# Patient Record
Sex: Female | Born: 1970
Health system: Southern US, Community
[De-identification: ages and names within clinical notes are randomized; demographics above are authoritative.]

## PROBLEM LIST (undated history)

## (undated) DIAGNOSIS — E039 Hypothyroidism, unspecified: Secondary | ICD-10-CM

## (undated) DIAGNOSIS — I10 Essential (primary) hypertension: Secondary | ICD-10-CM

## (undated) DIAGNOSIS — Z808 Family history of malignant neoplasm of other organs or systems: Secondary | ICD-10-CM

## (undated) DIAGNOSIS — Z803 Family history of malignant neoplasm of breast: Secondary | ICD-10-CM

## (undated) DIAGNOSIS — F419 Anxiety disorder, unspecified: Secondary | ICD-10-CM

## (undated) HISTORY — DX: Hypothyroidism, unspecified: E03.9

## (undated) HISTORY — DX: Family history of malignant neoplasm of breast: Z80.3

## (undated) HISTORY — DX: Family history of malignant neoplasm of other organs or systems: Z80.8

## (undated) HISTORY — PX: THYROIDECTOMY: SHX17

## (undated) HISTORY — DX: Anxiety disorder, unspecified: F41.9

---

## 1997-12-10 ENCOUNTER — Ambulatory Visit (HOSPITAL_COMMUNITY): Admission: AD | Admit: 1997-12-10 | Discharge: 1997-12-10 | Payer: Self-pay | Admitting: Obstetrics and Gynecology

## 1998-05-08 ENCOUNTER — Inpatient Hospital Stay (HOSPITAL_COMMUNITY): Admission: AD | Admit: 1998-05-08 | Discharge: 1998-05-08 | Payer: Self-pay | Admitting: Obstetrics and Gynecology

## 1998-05-13 ENCOUNTER — Inpatient Hospital Stay (HOSPITAL_COMMUNITY): Admission: AD | Admit: 1998-05-13 | Discharge: 1998-05-19 | Payer: Self-pay | Admitting: Obstetrics and Gynecology

## 1998-05-20 ENCOUNTER — Encounter (HOSPITAL_COMMUNITY): Admission: RE | Admit: 1998-05-20 | Discharge: 1998-07-01 | Payer: Self-pay | Admitting: Obstetrics and Gynecology

## 1999-08-22 ENCOUNTER — Other Ambulatory Visit: Admission: RE | Admit: 1999-08-22 | Discharge: 1999-08-22 | Payer: Self-pay | Admitting: Obstetrics and Gynecology

## 2000-08-12 ENCOUNTER — Other Ambulatory Visit: Admission: RE | Admit: 2000-08-12 | Discharge: 2000-08-12 | Payer: Self-pay | Admitting: Obstetrics and Gynecology

## 2000-09-06 ENCOUNTER — Other Ambulatory Visit: Admission: RE | Admit: 2000-09-06 | Discharge: 2000-09-06 | Payer: Self-pay | Admitting: Obstetrics and Gynecology

## 2000-09-06 ENCOUNTER — Encounter (INDEPENDENT_AMBULATORY_CARE_PROVIDER_SITE_OTHER): Payer: Self-pay | Admitting: Specialist

## 2000-10-14 ENCOUNTER — Other Ambulatory Visit: Admission: RE | Admit: 2000-10-14 | Discharge: 2000-10-14 | Payer: Self-pay | Admitting: Obstetrics and Gynecology

## 2000-10-14 ENCOUNTER — Encounter (INDEPENDENT_AMBULATORY_CARE_PROVIDER_SITE_OTHER): Payer: Self-pay

## 2001-05-30 ENCOUNTER — Other Ambulatory Visit: Admission: RE | Admit: 2001-05-30 | Discharge: 2001-05-30 | Payer: Self-pay | Admitting: Obstetrics and Gynecology

## 2001-12-23 ENCOUNTER — Other Ambulatory Visit: Admission: RE | Admit: 2001-12-23 | Discharge: 2001-12-23 | Payer: Self-pay | Admitting: Obstetrics and Gynecology

## 2010-10-17 ENCOUNTER — Emergency Department (HOSPITAL_COMMUNITY)
Admission: EM | Admit: 2010-10-17 | Discharge: 2010-10-17 | Payer: Self-pay | Source: Home / Self Care | Admitting: Emergency Medicine

## 2010-10-20 LAB — CBC
HCT: 35.9 % — ABNORMAL LOW (ref 36.0–46.0)
Hemoglobin: 11.7 g/dL — ABNORMAL LOW (ref 12.0–15.0)
MCH: 29 pg (ref 26.0–34.0)
MCHC: 32.6 g/dL (ref 30.0–36.0)
MCV: 88.9 fL (ref 78.0–100.0)
Platelets: 246 10*3/uL (ref 150–400)
RBC: 4.04 MIL/uL (ref 3.87–5.11)
RDW: 17.6 % — ABNORMAL HIGH (ref 11.5–15.5)
WBC: 9.6 10*3/uL (ref 4.0–10.5)

## 2010-10-20 LAB — DIFFERENTIAL
Basophils Absolute: 0.1 10*3/uL (ref 0.0–0.1)
Basophils Relative: 1 % (ref 0–1)
Eosinophils Absolute: 0.1 10*3/uL (ref 0.0–0.7)
Eosinophils Relative: 1 % (ref 0–5)
Lymphocytes Relative: 29 % (ref 12–46)
Lymphs Abs: 2.8 10*3/uL (ref 0.7–4.0)
Monocytes Absolute: 0.5 10*3/uL (ref 0.1–1.0)
Monocytes Relative: 5 % (ref 3–12)
Neutro Abs: 6.1 10*3/uL (ref 1.7–7.7)
Neutrophils Relative %: 64 % (ref 43–77)

## 2010-10-20 LAB — COMPREHENSIVE METABOLIC PANEL
ALT: 26 U/L (ref 0–35)
AST: 73 U/L — ABNORMAL HIGH (ref 0–37)
Albumin: 4.3 g/dL (ref 3.5–5.2)
Alkaline Phosphatase: 36 U/L — ABNORMAL LOW (ref 39–117)
BUN: 16 mg/dL (ref 6–23)
CO2: 24 mEq/L (ref 19–32)
Calcium: 8 mg/dL — ABNORMAL LOW (ref 8.4–10.5)
Chloride: 102 mEq/L (ref 96–112)
Creatinine, Ser: 0.98 mg/dL (ref 0.4–1.2)
GFR calc Af Amer: >60 mL/min (ref >60)
GFR calc non Af Amer: >60 mL/min (ref >60)
Glucose, Bld: 77 mg/dL (ref 70–99)
Potassium: 3.7 mEq/L (ref 3.5–5.1)
Sodium: 136 mEq/L (ref 135–145)
Total Bilirubin: 0.4 mg/dL (ref 0.3–1.2)
Total Protein: 7.5 g/dL (ref 6.0–8.3)

## 2010-10-20 LAB — LIPASE, BLOOD: Lipase: 44 U/L (ref 11–59)

## 2014-05-16 ENCOUNTER — Encounter: Payer: Self-pay | Admitting: *Deleted

## 2015-05-02 ENCOUNTER — Encounter (HOSPITAL_COMMUNITY): Payer: Self-pay | Admitting: Emergency Medicine

## 2015-05-02 ENCOUNTER — Emergency Department (HOSPITAL_COMMUNITY)
Admission: EM | Admit: 2015-05-02 | Discharge: 2015-05-02 | Disposition: A | Discharge status: Home / Self Care | Payer: Self-pay | Attending: Emergency Medicine | Admitting: Emergency Medicine

## 2015-05-02 DIAGNOSIS — J3489 Other specified disorders of nose and nasal sinuses: Secondary | ICD-10-CM | POA: Insufficient documentation

## 2015-05-02 DIAGNOSIS — Z79899 Other long term (current) drug therapy: Secondary | ICD-10-CM | POA: Insufficient documentation

## 2015-05-02 DIAGNOSIS — E039 Hypothyroidism, unspecified: Secondary | ICD-10-CM | POA: Insufficient documentation

## 2015-05-02 DIAGNOSIS — H53149 Visual discomfort, unspecified: Secondary | ICD-10-CM | POA: Insufficient documentation

## 2015-05-02 DIAGNOSIS — R197 Diarrhea, unspecified: Secondary | ICD-10-CM | POA: Insufficient documentation

## 2015-05-02 DIAGNOSIS — R519 Headache, unspecified: Secondary | ICD-10-CM

## 2015-05-02 DIAGNOSIS — R51 Headache: Secondary | ICD-10-CM | POA: Insufficient documentation

## 2015-05-02 DIAGNOSIS — R110 Nausea: Secondary | ICD-10-CM

## 2015-05-02 DIAGNOSIS — R0981 Nasal congestion: Secondary | ICD-10-CM | POA: Insufficient documentation

## 2015-05-02 DIAGNOSIS — F419 Anxiety disorder, unspecified: Secondary | ICD-10-CM | POA: Insufficient documentation

## 2015-05-02 DIAGNOSIS — Z87891 Personal history of nicotine dependence: Secondary | ICD-10-CM | POA: Insufficient documentation

## 2015-05-02 DIAGNOSIS — R0602 Shortness of breath: Secondary | ICD-10-CM | POA: Insufficient documentation

## 2015-05-02 DIAGNOSIS — I10 Essential (primary) hypertension: Secondary | ICD-10-CM | POA: Insufficient documentation

## 2015-05-02 HISTORY — DX: Essential (primary) hypertension: I10

## 2015-05-02 MED ORDER — KETOROLAC TROMETHAMINE 30 MG/ML IJ SOLN
30.0000 mg | Freq: Once | INTRAMUSCULAR | Status: AC
  Administered 2015-05-02: 30 mg via INTRAVENOUS
  Filled 2015-05-02: qty 1

## 2015-05-02 MED ORDER — OXYCODONE-ACETAMINOPHEN 5-325 MG PO TABS
1.0000 | ORAL_TABLET | Freq: Once | ORAL | Status: AC
  Administered 2015-05-02: 1 via ORAL
  Filled 2015-05-02: qty 1

## 2015-05-02 MED ORDER — DIPHENHYDRAMINE HCL 50 MG/ML IJ SOLN
25.0000 mg | Freq: Once | INTRAMUSCULAR | Status: AC
  Administered 2015-05-02: 25 mg via INTRAVENOUS
  Filled 2015-05-02: qty 1

## 2015-05-02 MED ORDER — TRAMADOL HCL 50 MG PO TABS: 50.0000 mg | ORAL_TABLET | Freq: Four times a day (QID) | ORAL | Status: DC | PRN

## 2015-05-02 MED ORDER — AZITHROMYCIN 250 MG PO TABS: ORAL_TABLET | ORAL | Status: DC

## 2015-05-02 MED ORDER — SODIUM CHLORIDE 0.9 % IV BOLUS (SEPSIS)
1000.0000 mL | Freq: Once | INTRAVENOUS | Status: AC
  Administered 2015-05-02: 1000 mL via INTRAVENOUS

## 2015-05-02 MED ORDER — PROCHLORPERAZINE EDISYLATE 5 MG/ML IJ SOLN
10.0000 mg | Freq: Once | INTRAMUSCULAR | Status: AC
  Administered 2015-05-02: 10 mg via INTRAVENOUS
  Filled 2015-05-02: qty 2

## 2015-05-02 MED ORDER — ONDANSETRON 4 MG PO TBDP
4.0000 mg | ORAL_TABLET | Freq: Once | ORAL | Status: AC
  Administered 2015-05-02: 4 mg via ORAL
  Filled 2015-05-02: qty 1

## 2015-05-02 MED ORDER — ONDANSETRON HCL 4 MG PO TABS: 4.0000 mg | ORAL_TABLET | Freq: Three times a day (TID) | ORAL | Status: DC | PRN

## 2015-05-02 NOTE — ED Notes (Signed)
Pt state she woke up today with a sharp headache 9/10 and nausea. Pt denies fever or chills. Pt having some HTN on arribal 140/106. Family member at the bedside. NAD noticed.

## 2015-05-02 NOTE — ED Provider Notes (Signed)
CSN: [id number]     Arrival date & time 05/02/15  0554 History   First MD Initiated Contact with Patient 05/02/15 [phone]     Chief Complaint  Patient presents with  . Headache  . Nausea     (Consider location/radiation/quality/duration/timing/severity/associated sxs/prior Treatment) HPI Comments: 36, 44 y/o female with history of anxiety, HTN, and hypothyroid due to thyroidectomy presents with headache and nausea. She has had SOB for four days that she attributes to the increased heat. Yesterday she went to her PCP who prescribed Zoloft for her and thought her SOB was due to anxiety. She started Zoloft last night. At 1 am this morning, she woke up with nausea. When she sat up, she felt a 7/10 pain right sided headache. The pain has remained constant.  She tends to get headaches related to her menstrual cycle and started menstruation 3 days ago. She also complains of right sided congestion and facial tenderness over her sinuses. She has had four watery BMs today. This is not the worst headache of her life. She has photophobia, but is able to sit in a room with lights on. She denies fever, chills, phonophobia, neck stiffness or tightness, vomiting, and vision changes.   Patient is a 44 y.o. female presenting with headaches. The history is provided by the patient.  Headache Pain location:  Frontal Radiates to:  Does not radiate Severity currently:  7/10 Severity at highest:  7/10 Onset quality:  Unable to specify Duration:  7 hours Timing:  Intermittent Progression:  Unchanged Chronicity:  Recurrent Relieved by:  None tried Worsened by:  Light Associated symptoms: facial pain, nausea and sinus pressure   Nausea:    Onset quality:  Sudden   Duration:  7 hours   Progression:  Unchanged   Past Medical History  Diagnosis Date  . Hypothyroidism   . Anxiety   . Hypertension    Past Surgical History  Procedure Laterality Date  . Thyroidectomy    . Cesarean section      Family History  Problem Relation Age of Onset  . Family history unknown: Yes   History  Substance Use Topics  . Smoking status: Former Games developer  . Smokeless tobacco: Not on file  . Alcohol Use: Yes     Comment: ocassionaly   OB History    No data available     Review of Systems  HENT: Positive for sinus pressure.   Gastrointestinal: Positive for nausea.       Loose watery stool  Neurological: Positive for headaches.  All other systems reviewed and are negative.     Allergies  Review of patient's allergies indicates no known allergies.  Home Medications   Prior to Admission medications   Medication Sig Start Date End Date Taking? Authorizing Provider  albuterol (PROVENTIL HFA;VENTOLIN HFA) 108 (90 BASE) MCG/ACT inhaler Inhale 2 puffs into the lungs every 4 (four) hours as needed for wheezing or shortness of breath.    Historical Provider, MD  escitalopram (LEXAPRO) 10 MG tablet Take 10 mg by mouth daily.    Historical Provider, MD  levothyroxine (SYNTHROID, LEVOTHROID) 175 MCG tablet Take 175 mcg by mouth daily before breakfast.    Historical Provider, MD   BP 132/92 mmHg  Pulse 67  Temp(Src) 97.4 F (36.3 C) (Oral)  Resp 16  Ht  (1.803 m)  Wt 278 lb (126.1 kg)  BMI 38.79 kg/m2  SpO2 94%  LMP 04/29/2015 Physical Exam  Constitutional: She is oriented to person, place,  and time. She appears well-developed and well-nourished. No distress.  HENT:  Head: Normocephalic and atraumatic.    Right Ear: External ear normal.  Left Ear: External ear normal.  Mouth/Throat: Oropharynx is clear and moist. No oropharyngeal exudate.  Eyes: Conjunctivae and EOM are normal. Pupils are equal, round, and reactive to light. No scleral icterus.  No horizontal, vertical or rotational nystagmus  Neck: Normal range of motion. Neck supple.  Full active and passive ROM without pain No midline or paraspinal tenderness No nuchal rigidity or meningeal signs  Cardiovascular: Normal  rate, regular rhythm, normal heart sounds and intact distal pulses.   Pulmonary/Chest: Effort normal and breath sounds normal. No respiratory distress. She has no wheezes. She has no rales.  Abdominal: Soft. Bowel sounds are normal. She exhibits no distension. There is no tenderness. There is no rebound and no guarding.  Musculoskeletal: Normal range of motion.  Lymphadenopathy:    She has no cervical adenopathy.  Neurological: She is alert and oriented to person, place, and time. She has normal reflexes. No cranial nerve deficit. She exhibits normal muscle tone. Coordination normal.  Mental Status:  Alert, oriented, thought content appropriate. Speech fluent without evidence of aphasia. Able to follow 2 step commands without difficulty.  Cranial Nerves:  II:  Peripheral visual fields grossly normal, pupils equal, round, reactive to light III,IV, VI: ptosis not present, extra-ocular motions intact bilaterally  V,VII: smile symmetric, facial light touch sensation equal VIII: hearing grossly normal bilaterally  IX,X: gag reflex present  XI: bilateral shoulder shrug equal and strong XII: midline tongue extension  Motor:  5/5 in upper and lower extremities bilaterally including strong and equal grip strength and dorsiflexion/plantar flexion Sensory: Pinprick and light touch normal in all extremities.  Deep Tendon Reflexes: 2+ and symmetric  Cerebellar: normal finger-to-nose with bilateral upper extremities Gait: normal gait and balance CV: distal pulses palpable throughout   Skin: Skin is warm and dry. No rash noted. She is not diaphoretic.  Psychiatric: She has a normal mood and affect. Her behavior is normal. Judgment and thought content normal.  Nursing note and vitals reviewed.   ED Course  Procedures (including critical care time) Labs Review Labs Reviewed - No data to display  Imaging Review No results found.   EKG Interpretation None      MDM   Final diagnoses:  Sinus  headache  Nausea    8:54 AM BP 133/101 mmHg  Pulse 72  Temp(Src) 97.4 F (36.3 C) (Oral)  Resp 16  Ht 5\' 11"  (1.803 m)  Wt 278 lb (126.1 kg)  BMI 38.79 kg/m2  SpO2 95%  LMP 04/29/2015 Patient with HA.  Pain and nausea resolved with treatment. Pt HA treated and improved while in ED.  Presentation is non concerning for [facility], ICH, Meningitis, or temporal arteritis. Pt is afebrile with no focal neuro deficits, nuchal rigidity, or change in vision. Pt is to follow up with PCP. Pt verbalizes understanding and is agreeable with plan to dc.     Arthor Captain, PA-C 05/02/15 1610  Nelva Nay, MD 05/03/15 [phone]

## 2015-05-02 NOTE — Discharge Instructions (Signed)
You are having a headache. No specific cause was found today for your headache. It may have been a migraine or other cause of headache. Stress, anxiety, fatigue, and depression are common triggers for headaches. Your headache today does not appear to be life-threatening or require hospitalization, but often the exact cause of headaches is not determined in the emergency department. Therefore, follow-up with your doctor is very important to find out what may have caused your headache, and whether or not you need any further diagnostic testing or treatment. Sometimes headaches can appear benign (not harmful), but then more serious symptoms can develop which should prompt an immediate re-evaluation by your doctor or the emergency department. SEEK MEDICAL ATTENTION IF: You develop possible problems with medications prescribed.  The medications don't resolve your headache, if it recurs , or if you have multiple episodes of vomiting or can't take fluids. You have a change from the usual headache. RETURN IMMEDIATELY IF you develop a sudden, severe headache or confusion, become poorly responsive or faint, develop a fever above 100.32F or problem breathing, have a change in speech, vision, swallowing, or understanding, or develop new weakness, numbness, tingling, incoordination, or have a seizure.   Sinus Headache A sinus headache is when your sinuses become clogged or swollen. Sinus headaches can range from mild to severe.  CAUSES A sinus headache can have different causes, such as:  Colds.  Sinus infections.  Allergies. SYMPTOMS  Symptoms of a sinus headache may vary and can include:  Headache.  Pain or pressure in the face.  Congested or runny nose.  Fever.  Inability to smell.  Pain in upper teeth. Weather changes can make symptoms worse. TREATMENT  The treatment of a sinus headache depends on the cause.  Sinus pain caused by a sinus infection may be treated with antibiotic  medicine.  Sinus pain caused by allergies may be helped by allergy medicines (antihistamines) and medicated nasal sprays.  Sinus pain caused by congestion may be helped by flushing the nose and sinuses with saline solution. HOME CARE INSTRUCTIONS   If antibiotics are prescribed, take them as directed. Finish them even if you start to feel better.  Only take over-the-counter or prescription medicines for pain, discomfort, or fever as directed by your caregiver.  If you have congestion, use a nasal spray to help reduce pressure. SEEK IMMEDIATE MEDICAL CARE IF:  You have a fever.  You have headaches more than once a week.  You have sensitivity to light or sound.  You have repeated nausea and vomiting.  You have vision problems.  You have sudden, severe pain in your face or head.  You have a seizure.  You are confused.  Your sinus headaches do not get better after treatment. Many people think they have a sinus headache when they actually have migraines or tension headaches. MAKE SURE YOU:   Understand these instructions.  Will watch your condition.  Will get help right away if you are not doing well or get worse. Document Released: 10/29/2004 Document Revised: 12/14/2011 Document Reviewed: 12/20/2010 [facility] Patient Information 2015 [place], [place]. This information is not intended to replace advice given to you by your health care provider. Make sure you discuss any questions you have with your health care provider.  Nausea, Adult Nausea is the feeling that you have an upset stomach or have to vomit. Nausea by itself is not likely a serious concern, but it may be an early sign of more serious medical problems. As nausea gets worse,  it can lead to vomiting. If vomiting develops, there is the risk of dehydration.  CAUSES   Viral infections.  Food poisoning.  Medicines.  Pregnancy.  Motion sickness.  Migraine headaches.  Emotional distress.  Severe pain from any  source.  Alcohol intoxication. HOME CARE INSTRUCTIONS  Get plenty of rest.  Ask your caregiver about specific rehydration instructions.  Eat small amounts of food and sip liquids more often.  Take all medicines as told by your caregiver. SEEK MEDICAL CARE IF:  You have not improved after 2 days, or you get worse.  You have a headache. SEEK IMMEDIATE MEDICAL CARE IF:   You have a fever.  You faint.  You keep vomiting or have blood in your vomit.  You are extremely weak or dehydrated.  You have dark or bloody stools.  You have severe chest or abdominal pain. MAKE SURE YOU:  Understand these instructions.  Will watch your condition.  Will get help right away if you are not doing well or get worse. Document Released: 10/29/2004 Document Revised: 06/15/2012 Document Reviewed: 06/03/2011 [facility] Patient Information 2015 [place], [place]. This information is not intended to replace advice given to you by your health care provider. Make sure you discuss any questions you have with your health care provider.

## 2015-05-21 ENCOUNTER — Encounter (HOSPITAL_COMMUNITY): Payer: Self-pay | Admitting: *Deleted

## 2015-05-21 ENCOUNTER — Emergency Department (HOSPITAL_COMMUNITY)
Admission: EM | Admit: 2015-05-21 | Discharge: 2015-05-22 | Disposition: A | Discharge status: Home / Self Care | Payer: Self-pay | Attending: Emergency Medicine | Admitting: Emergency Medicine

## 2015-05-21 DIAGNOSIS — Z87891 Personal history of nicotine dependence: Secondary | ICD-10-CM | POA: Insufficient documentation

## 2015-05-21 DIAGNOSIS — Z8659 Personal history of other mental and behavioral disorders: Secondary | ICD-10-CM | POA: Insufficient documentation

## 2015-05-21 DIAGNOSIS — I471 Supraventricular tachycardia: Secondary | ICD-10-CM | POA: Insufficient documentation

## 2015-05-21 DIAGNOSIS — I10 Essential (primary) hypertension: Secondary | ICD-10-CM | POA: Insufficient documentation

## 2015-05-21 DIAGNOSIS — R112 Nausea with vomiting, unspecified: Secondary | ICD-10-CM | POA: Insufficient documentation

## 2015-05-21 DIAGNOSIS — E876 Hypokalemia: Secondary | ICD-10-CM | POA: Insufficient documentation

## 2015-05-21 LAB — I-STAT CHEM 8, ED
BUN: 20 mg/dL (ref 6–20)
Calcium, Ion: 0.91 mmol/L — ABNORMAL LOW (ref 1.12–1.23)
Chloride: 105 mmol/L (ref 101–111)
Creatinine, Ser: 1.3 mg/dL — ABNORMAL HIGH (ref 0.44–1.00)
Glucose, Bld: 73 mg/dL (ref 65–99)
HCT: 40 % (ref 36.0–46.0)
Hemoglobin: 13.6 g/dL (ref 12.0–15.0)
Potassium: 3.4 mmol/L — ABNORMAL LOW (ref 3.5–5.1)
Sodium: 143 mmol/L (ref 135–145)
TCO2: 24 mmol/L (ref 0–100)

## 2015-05-21 LAB — T4, FREE: Free T4: 0.33 ng/dL — ABNORMAL LOW (ref 0.61–1.12)

## 2015-05-21 MED ORDER — SODIUM CHLORIDE 0.9 % IV SOLN
1.0000 g | Freq: Once | INTRAVENOUS | Status: AC
  Administered 2015-05-22: 1 g via INTRAVENOUS
  Filled 2015-05-21: qty 10

## 2015-05-21 MED ORDER — POTASSIUM CHLORIDE CRYS ER 20 MEQ PO TBCR
20.0000 meq | EXTENDED_RELEASE_TABLET | Freq: Once | ORAL | Status: AC
  Administered 2015-05-21: 20 meq via ORAL
  Filled 2015-05-21: qty 1

## 2015-05-21 MED ORDER — PROPRANOLOL HCL 1 MG/ML IV SOLN
1.0000 mg | Freq: Once | INTRAVENOUS | Status: DC
  Filled 2015-05-21: qty 1

## 2015-05-21 MED ORDER — SODIUM CHLORIDE 0.9 % IV BOLUS (SEPSIS)
1000.0000 mL | Freq: Once | INTRAVENOUS | Status: AC
  Administered 2015-05-21: 1000 mL via INTRAVENOUS

## 2015-05-21 NOTE — ED Provider Notes (Signed)
History   Chief Complaint  Patient presents with  . Anxiety  . Tachycardia    HPI 44 year old female past history is below notable for hypothyroidism status post thyroidectomy, hypertension who presents to ED for evaluation of anxiety and tachycardia. Patient reports she was at work when she suddenly began feeling anxious, diaphoretic, short of breath, having palpitations. She denies ever having any chest pain, nausea, vomiting. She patient denies symptoms similar to this in the past. She states his last for approximately 30 minutes. Upon EMS arrival, EMS reports seeing a narrow complex tachycardia in the 220s. They administered 6 mg of adenosine without improvement followed by 12 mg of adenosine which subsequently converted patient in normal sinus rhythm at a rate of 100. Upon arrival, patient is without complaints and states she is feeling much better now. She states she had her thyroid medications changed approximately 3 months ago. She's been taking all her medications as instructed. She denies having any other symptoms at this time. No recent illness or hospitalization.  Past medical/surgical history, social history, medications, allergies and FH have been reviewed with patient and/or in documentation. Furthermore, if pt family or friend(s) present, additional historical information was obtained from them.  Past Medical History  Diagnosis Date  . Hypothyroidism   . Anxiety   . Hypertension    Past Surgical History  Procedure Laterality Date  . Thyroidectomy    . Cesarean section     Family History  Problem Relation Age of Onset  . Family history unknown: Yes   Social History  Substance Use Topics  . Smoking status: Former Games developer  . Smokeless tobacco: None  . Alcohol Use: Yes     Comment: ocassionaly     Review of Systems Constitutional: - F/C, -fatigue.  positive diaphoresis HENT: - congestion, -rhinorrhea, -sore throat.   Eyes: - eye pain, -visual disturbance.   Respiratory: - cough, +SOB, -hemoptysis.   Cardiovascular: - CP, +palps.  Gastrointestinal: - N/V/D, -abd pain  Genitourinary: - flank pain, -dysuria, -frequency.  Musculoskeletal: - myalgia/arthritis, -joint swelling, -gait abnormality, -back pain, -neck pain/stiffness, -leg pain/swelling.  Skin: - rash/lesion.  Neurological: - focal weakness, -lightheadedness, -dizziness, -numbness, -HA.  All other systems reviewed and are negative.   Physical Exam  Physical Exam  ED Triage Vitals  Enc Vitals Group     BP 05/21/15 2200 120/80 mmHg     Pulse Rate 05/21/15 2200 104     Resp 05/21/15 2200 15     Temp 05/21/15 2202 97.8 F (36.6 C)     Temp Source 05/21/15 2202 Oral     SpO2 05/21/15 2152 100 %     Weight 05/21/15 2202 273 lb (123.832 kg)     Height 05/21/15 2202  (1.803 m)     Head Cir --      Peak Flow --      Pain Score 05/21/15 2203 0     Pain Loc --      Pain Edu? --      Excl. in GC? --    Constitutional: Well-appearing, well-hydrated female in no apparent distress. Head: Normocephalic and atraumatic.  Eyes: Extraocular motion intact, no scleral icterus Mouth: MMM, OP clear Neck: Supple without meningismus, mass, or overt JVD. No tenderness or thyromegaly Respiratory: No respiratory distress. Normal WOB. No w/r/g. CV: Tachycardic, regular rate, no obvious murmurs.  Pulses +2 and symmetric. Euvolemic Abdomen: Soft, NT, ND, no r/g. No mass.  MSK: Extremities are atraumatic without deformity, ROM intact Skin:  Warm, dry, intact without rash Neuro: AAOx4, MAE 5/5 sym, no focal deficit noted   ED Course  Procedures   Labs Reviewed  TSH - Abnormal; Notable for the following:    TSH 164.000 (*)    All other components within normal limits  T4, FREE - Abnormal; Notable for the following:    Free T4 0.33 (*)    All other components within normal limits  I-STAT CHEM 8, ED - Abnormal; Notable for the following:    Potassium 3.4 (*)    Creatinine, Ser 1.30 (*)     Calcium, Ion 0.91 (*)    All other components within normal limits  MAGNESIUM   I personally reviewed and interpreted all labs.  No results found. I personally viewed above image(s) which were used in my medical decision making. Formal interpretations by Radiology.   EKG Interpretation  Date/Time:  Tuesday May 21 2015 21:58:08 EDT Ventricular Rate:  105 PR Interval:  174 QRS Duration: 88 QT Interval:  328 QTC Calculation: 433 R Axis:   22 Text Interpretation:  Sinus tachycardia Low voltage, precordial leads Probable anteroseptal infarct, old Nonspecific repol abnormality, diffuse leads No old tracing to compare Confirmed by Mirian Mo [id number]) on 05/21/2015 11:47:13 PM        MDM: Carla Huffman is a 44 y.o. female with H&P as above who p/w CC: Tachycardia  Upon arrival, patient is hemodynamically stable and in no apparent distress. EMS rhythm strips were reviewed and are consistent with SVT. Patient was converted with a total of 18 mg of adenosine to normal sinus rhythm. EKG in ED shows sinus tachycardia at a rate of 105, low voltage, nonspecific repo abnormality. No old EKGs to compare. Screening labs were sent, IV fluids begun. Labs were notable for slight hypokalemia, hypocalcemia. Electrolytes were replaced in ED. Patient remained stable and is without complaints currently. Patient was given IV Inderal to prevent recurrence. Patient remained stable in ED and did not have any recurrence of SVT. Patient was deemed stable for discharge. Given abnormal thyroid labs, patient was instructed to follow closely with her doctor in the next 2-3 days for further management. Additionally patient is given strict return precautions and patient voice understanding. Stable for discharge. Old records reviewed (if available). Labs and imaging reviewed personally by myself and considered in medical decision making if ordered. Clinical Impression: 1. SVT (supraventricular tachycardia)    2. Hypokalemia   3. Hypocalcemia     Disposition: Discharge  Condition: Good  I have discussed the results, Dx and Tx plan with the pt(& family if present). He/she/they expressed understanding and agree(s) with the plan. Discharge instructions discussed at great length. Strict return precautions discussed and pt &/or family have verbalized understanding of the instructions. No further questions at time of discharge.    New Prescriptions   No medications on file    Follow Up: Irene Limbo, Cordelia Poche [address] [place] [address] [phone]     [facility]     [address] [place] [address] [phone] Schedule an appointment as soon as possible for a visit in 1 week   [facility] EMERGENCY DEPARTMENT [address] [id number] mc [place] [address] [phone]  If symptoms worsen   Pt seen in conjunction with Dr. Mirian Mo, MD  Ames Dura, DO [facility] Emergency Medicine Resident - PGY-3      Ames Dura, MD 05/22/15 Earle Gell  Mirian Mo, MD 05/26/15 [phone]

## 2015-05-21 NOTE — ED Notes (Signed)
Pt arrives from work c/o of anxiety, dizziness and left arm tingling. Upon EMS arrival pt HR was 220,  of Adenosine and then  of Adenosine, pt converted to NSR with a rate of 90-100. Pt was noted to be diaphoretic and pale when EMS arrived. BP 114/60. Pt also received of NS.

## 2015-05-22 LAB — MAGNESIUM: Magnesium: 2 mg/dL (ref 1.7–2.4)

## 2015-05-22 MED ORDER — PROPRANOLOL HCL 1 MG/ML IV SOLN
1.0000 mg | Freq: Once | INTRAVENOUS | Status: AC
  Administered 2015-05-22: 1 mg via INTRAVENOUS

## 2015-05-22 NOTE — Discharge Instructions (Signed)
Supraventricular Tachycardia °Supraventricular tachycardia (SVT) is when the heart beats very fast. SVT can last for a long time (sustained) or it can start and stop suddenly (nonsustained). °HOME CARE  °· Take your heart medicine as told by your doctor. Check with your doctor before taking cold, diet, or herbal medicine. °· Do not smoke. °· Do not drink large amounts of caffeine. Caffeine is found in coffee, tea, soda (pop, cola), and chocolate.  °· Keep all doctor visits as told. °GET HELP RIGHT AWAY IF:  °· You have chest pain or pressure. °· You cannot catch your breath. °· You are dizzy or lightheaded. °· You feel like you will pass out (faint). °· You are sweaty (diaphoretic) and feel sick to your stomach (nauseous) or throw up (vomit).   °If you have the above problems, call your local emergency services (911 in [place] right away. Do not drive yourself to the hospital. °MAKE SURE YOU:  °· Understand these instructions. °· Will watch your condition. °· Will get help right away if you are not doing well or get worse. °Document Released: 09/21/2005 Document Revised: 12/14/2011 Document Reviewed: 12/26/2008 °[employer]® Patient Information ©2015 [employer]. This information is not intended to replace advice given to you by your health care provider. Make sure you discuss any questions you have with your health care provider. ° °

## 2015-05-27 ENCOUNTER — Telehealth (HOSPITAL_COMMUNITY): Payer: Self-pay

## 2015-05-27 LAB — TSH: TSH: >90 u[IU]/mL — ABNORMAL HIGH (ref 0.350–4.500)

## 2016-08-18 ENCOUNTER — Ambulatory Visit (HOSPITAL_COMMUNITY)
Admission: EM | Admit: 2016-08-18 | Discharge: 2016-08-18 | Disposition: A | Discharge status: Home / Self Care | Payer: PRIVATE HEALTH INSURANCE | Attending: Family Medicine | Admitting: Family Medicine

## 2016-08-18 ENCOUNTER — Encounter (HOSPITAL_COMMUNITY): Payer: Self-pay | Admitting: Emergency Medicine

## 2016-08-18 DIAGNOSIS — J069 Acute upper respiratory infection, unspecified: Secondary | ICD-10-CM

## 2016-08-18 MED ORDER — IPRATROPIUM BROMIDE 0.06 % NA SOLN: 2.0000 | Freq: Four times a day (QID) | NASAL | 1 refills | Status: DC

## 2016-08-18 MED ORDER — AZITHROMYCIN 250 MG PO TABS: ORAL_TABLET | ORAL | 0 refills | Status: DC

## 2016-08-18 MED ORDER — HYDROCOD POLST-CPM POLST ER 10-8 MG/5ML PO SUER: 5.0000 mL | Freq: Two times a day (BID) | ORAL | 0 refills | Status: DC | PRN

## 2016-08-18 NOTE — ED Triage Notes (Signed)
[facility] with a complaint of a cough and chest congestion x 6 days.

## 2016-08-18 NOTE — ED Provider Notes (Signed)
[facility]    CSN: [id number] Arrival date & time: 08/18/16  1013     History   Chief Complaint No chief complaint on file.   HPI Carla Huffman is a 45 y.o. female.   The history is provided by the patient.  URI  Presenting symptoms: congestion, cough, rhinorrhea and sore throat   Presenting symptoms: no fever   Severity:  Moderate Onset quality:  Gradual Duration:  10 days Progression:  Unchanged Chronicity:  New Relieved by:  None tried Worsened by:  Nothing Ineffective treatments:  None tried Associated symptoms: sneezing     Past Medical History:  Diagnosis Date  . Anxiety   . Hypertension   . Hypothyroidism     There are no active problems to display for this patient.   Past Surgical History:  Procedure Laterality Date  . CESAREAN SECTION    . THYROIDECTOMY      OB History    No data available       Home Medications    Prior to Admission medications   Medication Sig Start Date End Date Taking? Authorizing Provider  albuterol (PROVENTIL HFA;VENTOLIN HFA) 108 (90 BASE) MCG/ACT inhaler Inhale 2 puffs into the lungs every 4 (four) hours as needed for wheezing or shortness of breath.    Historical Provider, MD  ALPRAZolam Prudy Feeler(XANAX) 0.5 MG tablet Take 0.5 mg by mouth 2 (two) times daily as needed for anxiety.    Historical Provider, MD  azithromycin (ZITHROMAX Z-PAK) 250 MG tablet 2 po day one, then 1 daily x 4 days 05/02/15   Arthor CaptainAbigail Harris, PA-C  escitalopram (LEXAPRO) 10 MG tablet Take 10 mg by mouth daily.    Historical Provider, MD  levothyroxine (SYNTHROID, LEVOTHROID) 175 MCG tablet Take 175 mcg by mouth daily before breakfast.    Historical Provider, MD  ondansetron (ZOFRAN) 4 MG tablet Take 1 tablet (4 mg total) by mouth every 8 (eight) hours as needed for nausea or vomiting. 05/02/15   Arthor CaptainAbigail Harris, PA-C  traMADol (ULTRAM) 50 MG tablet Take 1 tablet (50 mg total) by mouth every 6 (six) hours as needed. 05/02/15   Arthor CaptainAbigail Harris,  PA-C    Family History Family History  Problem Relation Age of Onset  . Family history unknown: Yes    Social History Social History  Substance Use Topics  . Smoking status: Former Games developermoker  . Smokeless tobacco: Not on file  . Alcohol use Yes     Comment: ocassionaly     Allergies   Patient has no known allergies.   Review of Systems Review of Systems  Constitutional: Negative.  Negative for fever.  HENT: Positive for congestion, postnasal drip, rhinorrhea, sneezing and sore throat.   Respiratory: Positive for cough.   All other systems reviewed and are negative.    Physical Exam Triage Vital Signs ED Triage Vitals [08/18/16 1052]  Enc Vitals Group     BP 115/72     Pulse Rate 97     Resp 16     Temp 99 F (37.2 C)     Temp Source Oral     SpO2 97 %     Weight      Height      Head Circumference      Peak Flow      Pain Score      Pain Loc      Pain Edu?      Excl. in GC?    No data found.  Updated Vital Signs BP 115/72 (BP Location: Left Arm)   Pulse 97   Temp 99 F (37.2 C) (Oral)   Resp 16   SpO2 97%   Visual Acuity Right Eye Distance:   Left Eye Distance:   Bilateral Distance:    Right Eye Near:   Left Eye Near:    Bilateral Near:     Physical Exam  Constitutional: She appears well-developed and well-nourished. No distress.  HENT:  Right Ear: External ear normal.  Left Ear: External ear normal.  Nose: Mucosal edema and rhinorrhea present.  Mouth/Throat: Oropharynx is clear and moist. No oropharyngeal exudate.  Neck: Normal range of motion. Neck supple.  Cardiovascular: Normal rate, regular rhythm and normal heart sounds.   Pulmonary/Chest: Effort normal and breath sounds normal.  Lymphadenopathy:    She has no cervical adenopathy.  Skin: Skin is warm and dry.  Nursing note and vitals reviewed.    UC Treatments / Results  Labs (all labs ordered are listed, but only abnormal results are displayed) Labs Reviewed - No data to  display  EKG  EKG Interpretation None       Radiology No results found.  Procedures Procedures (including critical care time)  Medications Ordered in UC Medications - No data to display   Initial Impression / Assessment and Plan / UC Course  I have reviewed the triage vital signs and the nursing notes.  Pertinent labs & imaging results that were available during my care of the patient were reviewed by me and considered in my medical decision making (see chart for details).  Clinical Course       Final Clinical Impressions(s) / UC Diagnoses   Final diagnoses:  None    New Prescriptions New Prescriptions   No medications on file     Linna HoffJames D Kindl, MD 08/18/16 1118

## 2017-06-06 DIAGNOSIS — M7989 Other specified soft tissue disorders: Secondary | ICD-10-CM | POA: Diagnosis not present

## 2017-06-06 DIAGNOSIS — R030 Elevated blood-pressure reading, without diagnosis of hypertension: Secondary | ICD-10-CM | POA: Diagnosis not present

## 2017-06-21 ENCOUNTER — Telehealth: Payer: Self-pay

## 2017-06-21 MED ORDER — LEVOTHYROXINE SODIUM 175 MCG PO TABS: 175.0000 ug | ORAL_TABLET | Freq: Every day | ORAL | 0 refills | Status: DC

## 2017-06-21 NOTE — Telephone Encounter (Signed)
Patient called left message requesting refill on levothyroxine she states she only has 1 tablet left . Patient is a new patient  first appt 9-26 no labs since 05/2015 [facility] ay to refill

## 2017-06-21 NOTE — Telephone Encounter (Signed)
Send Rx for # 30 + 0 to hold her over until OV/ labs here

## 2017-06-21 NOTE — Telephone Encounter (Signed)
Pt is aware we will send over 30 day supply to hold her until her office visit

## 2017-06-30 ENCOUNTER — Ambulatory Visit (INDEPENDENT_AMBULATORY_CARE_PROVIDER_SITE_OTHER): Payer: Self-pay | Admitting: Physician Assistant

## 2017-06-30 ENCOUNTER — Encounter: Payer: Self-pay | Admitting: Physician Assistant

## 2017-06-30 VITALS — BP 130/102 | HR 96 | Temp 98.1°F | Resp 16 | Ht 71.0 in | Wt 299.6 lb

## 2017-06-30 DIAGNOSIS — E039 Hypothyroidism, unspecified: Secondary | ICD-10-CM

## 2017-06-30 DIAGNOSIS — L989 Disorder of the skin and subcutaneous tissue, unspecified: Secondary | ICD-10-CM

## 2017-06-30 DIAGNOSIS — Z23 Encounter for immunization: Secondary | ICD-10-CM

## 2017-06-30 DIAGNOSIS — F411 Generalized anxiety disorder: Secondary | ICD-10-CM

## 2017-06-30 LAB — TSH: TSH: 0.4 mIU/L

## 2017-06-30 MED ORDER — ALPRAZOLAM 0.5 MG PO TABS: 0.5000 mg | ORAL_TABLET | Freq: Two times a day (BID) | ORAL | 0 refills | Status: DC | PRN

## 2017-06-30 NOTE — Progress Notes (Signed)
Patient ID: Carla Huffman MRN: [id number], DOB: 12/11/1970, 46 y.o. Date of Encounter: @  Chief Complaint:  Chief Complaint  Patient presents with  . New Patient (Initial Visit)  . skin tag under chin    HPI: 46 y.o. year old female  presents as a New Patient to Establish Care.   She reports that she is just changing PCP offices.  Had been going to an [place] office.   Her last visit there was about 6 months ago.   Says that she would go there routinely to check lab to monitor her thyroid and also to get medication to use as needed for anxiety.  She uses Xanax as needed for anxiety but says that she uses it very rarely-- probably about once every 2 weeks.  She has her last prescription bottle with her and this was dated 11/11/16 for #60.  Says that that has lasted all of this time. Says that the prescription usually does always last between 2-1/2  -- 3 months.  The main things that she wanted to do at today's visit was to check TSH and get refill on Xanax.  Also states that she has a lesion on the front of her neck that she wants to get removed.  Says that it just developed/appeared about 3 months ago.  Says that her son has a small little dog that frequently jumps on her and seems to scratch/rub it and get it irritated.  I also asked if she had had a complete physical exam or had her cholesterol checked recently etc.  Says that her PCP did check cholesterol about 1 year ago. However says that she "has gained about 40 pounds since then so her cholesterol probably should be rechecked."  She is agreeable to return for a complete physical exam in the future.  She does see a Theatre manager, Dr. Huntley Dec.  No other concerns to address today.   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 However she says that she does have time available between 3:00 to 4:00 PM between the jobs and also is available on Friday  afternoons.   Past Medical History:  Diagnosis Date  . Anxiety   . Hypertension   . Hypothyroidism      Home Meds: Outpatient Medications Prior to Visit  Medication Sig Dispense Refill  . albuterol (PROVENTIL HFA;VENTOLIN HFA) 108 (90 BASE) MCG/ACT inhaler Inhale 2 puffs into the lungs every 4 (four) hours as needed for wheezing or shortness of breath.    Marland Kitchen ipratropium (ATROVENT) 0.06 % nasal spray Place 2 sprays into both nostrils 4 (four) times daily. 15 mL 1  . levothyroxine (SYNTHROID, LEVOTHROID) 175 MCG tablet Take 1 tablet (175 mcg total) by mouth daily before breakfast. 30 tablet 0  . ALPRAZolam (XANAX) 0.5 MG tablet Take 0.5 mg by mouth 2 (two) times daily as needed for anxiety.    Marland Kitchen azithromycin (ZITHROMAX Z-PAK) 250 MG tablet Take as directed on pack 6 tablet 0  . chlorpheniramine-HYDROcodone (TUSSIONEX PENNKINETIC ER) 10-8 MG/5ML SUER Take 5 mLs by mouth every 12 (twelve) hours as needed for cough. 140 mL 0  . escitalopram (LEXAPRO) 10 MG tablet Take 10 mg by mouth daily.    . ondansetron (ZOFRAN) 4 MG tablet Take 1 tablet (4 mg total) by mouth every 8 (eight) hours as needed for nausea or vomiting. 10 tablet 0  . traMADol (ULTRAM) 50 MG tablet Take 1 tablet (  50 mg total) by mouth every 6 (six) hours as needed. 15 tablet 0   No facility-administered medications prior to visit.     Allergies: No Known Allergies  Social History   Social History  . Marital status: Married    Spouse name: N/A  . Number of children: N/A  . Years of education: N/A   Occupational History  . Not on file.   Social History Main Topics  . Smoking status: Former Games developer  . Smokeless tobacco: Never Used  . Alcohol use Yes     Comment: ocassionaly  . Drug use: No  . Sexual activity: Not on file   Other Topics Concern  . Not on file   Social History Narrative  . No narrative on file    Family History  Problem Relation Age of Onset  . Family history unknown: Yes     Review of  Systems:  See HPI for pertinent ROS. All other ROS negative.    Physical Exam: Blood pressure (!) 130/102, pulse 96, temperature 98.1 F (36.7 C), temperature source Oral, resp. rate 16, height  (1.803 m), weight 135.9 kg (299 lb 9.6 oz), last menstrual period 06/17/2017, SpO2 97 %., Body mass index is 41.79 kg/m. General: Obese WF.  Appears in no acute distress. Neck: Supple. No thyromegaly. No lymphadenopathy. Lungs: Clear bilaterally to auscultation without wheezes, rales, or rhonchi. Breathing is unlabored. Heart: RRR with S1 S2. No murmurs, rubs, or gallops. Musculoskeletal:  Strength and tone normal for age. Extremities/Skin: Warm and dry. No edema.  Skin: Anterior Neck: There is a ~ 3mm diameter lesion that protrudes ~ 5mm outward. The base of the lesion is larger than what is usually seen with most skin tags.  The distal portion of lesion looks like a wart---but may just be thickened skin secondary to dog scratching/irritating lesion. Neuro: Alert and oriented X 3. Moves all extremities spontaneously. Gait is normal. CNII-XII grossly in tact. Psych:  Responds to questions appropriately with a normal affect.     ASSESSMENT AND PLAN:  46 y.o. year old female with  1. Hypothyroidism, unspecified type Will check a TSH today to monitor and refill medication. - TSH  2. Generalized anxiety disorder This is well controlled. Continue current medication as needed. - ALPRAZolam (XANAX) 0.5 MG tablet; Take 1 tablet (0.5 mg total) by mouth 2 (two) times daily as needed for anxiety.  Dispense: 60 tablet; Refill: 0  3. Flu vaccine need - Flu Vaccine QUAD 36+ mos IM  4. 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 However she says that she does have time available between 3:00 to 4:00 PM between the jobs and also is available on Friday afternoons. Will have her schedule a follow-up visit in one week between 3:00 -4:00  PM for [place] to remove this skin lesion and sent to pathology.   She will then schedule a complete physical exam one morning so she can come fasting to that appointment.  She will then come for 6 months for ROVs/TSH.   Signed, [address] [place], [place], [facility] [facility] 06/30/2017 10:56 AM

## 2017-07-05 ENCOUNTER — Ambulatory Visit (INDEPENDENT_AMBULATORY_CARE_PROVIDER_SITE_OTHER): Payer: 59 | Admitting: Physician Assistant

## 2017-07-05 ENCOUNTER — Encounter: Payer: Self-pay | Admitting: Physician Assistant

## 2017-07-05 VITALS — BP 136/88 | HR 92 | Temp 98.1°F | Resp 14 | Ht 71.0 in | Wt 296.0 lb

## 2017-07-05 DIAGNOSIS — L989 Disorder of the skin and subcutaneous tissue, unspecified: Secondary | ICD-10-CM

## 2017-07-05 NOTE — Progress Notes (Signed)
    Patient ID: Carla Huffman MRN: [id number], DOB: 1971-09-03, 46 y.o. Date of Encounter: 07/05/2017, 10:44 AM    Chief Complaint:  Chief Complaint  Patient presents with  . Follow-up    is not fasting  . Neoplasm Removal    skin tag/ mole to neck     HPI: 46 y.o. year old female recently had an office visit to establish care with me. At that visit she mentioned that she had this lesion on the anterior aspect of her neck that was very irritating and needed to be removed. Returns today to have this removed. No other concerns to address today. She is agreeable to return for a physical and will come fasting one morning for that appointment.     Home Meds:   Outpatient Medications Prior to Visit  Medication Sig Dispense Refill  . ALPRAZolam (XANAX) 0.5 MG tablet Take 1 tablet (0.5 mg total) by mouth 2 (two) times daily as needed for anxiety. 60 tablet 0  . levothyroxine (SYNTHROID, LEVOTHROID) 175 MCG tablet Take 1 tablet (175 mcg total) by mouth daily before breakfast. 30 tablet 0  . albuterol (PROVENTIL HFA;VENTOLIN HFA) 108 (90 BASE) MCG/ACT inhaler Inhale 2 puffs into the lungs every 4 (four) hours as needed for wheezing or shortness of breath.    Marland Kitchen ipratropium (ATROVENT) 0.06 % nasal spray Place 2 sprays into both nostrils 4 (four) times daily. (Patient not taking: Reported on 07/05/2017) 15 mL 1   No facility-administered medications prior to visit.     Allergies: No Known Allergies    Review of Systems: See HPI for pertinent ROS. All other ROS negative.    Physical Exam: Blood pressure 136/88, pulse 92, temperature 98.1 F (36.7 C), temperature source Oral, resp. rate 14, height  (1.803 m), weight 134.3 kg (296 lb), last menstrual period 06/17/2017, SpO2 96 %., Body mass index is 41.28 kg/m. General:  WF. Appears in no acute distress. Neck: Supple. No thyromegaly. No lymphadenopathy. Lungs: Clear bilaterally to auscultation without wheezes, rales, or rhonchi.  Breathing is unlabored. Heart: Regular rhythm. No murmurs, rubs, or gallops. Msk:  Strength and tone normal for age. Skin: Anterior Neck: There is a skin lesion that is ~ 4mm diameter and protrudes outward ~ 5mm. The distal portion of lesion looks like a wart. Neuro: Alert and oriented X 3. Moves all extremities spontaneously. Gait is normal. CNII-XII grossly in tact. Psych:  Responds to questions appropriately with a normal affect.     ASSESSMENT AND PLAN:  46 y.o. year old female with  1. Skin lesion of neck Site was cleansed with Betadine and alcohol. Site was anesthetized using lidocaine/epi. Shave biopsy/excision was performed. Sent for pathology. Very minimal bleeding was well-controlled easily controlled using nitrous oxide stick. She is to keep the site clean and dry. Follow up pathology report. - Pathology  She is scheduling a complete physical exam for one morning. She is to come fasting for that visit.   Signed, [address] [place], [place], [facility] 07/05/2017 10:44 AM

## 2017-07-07 LAB — TISSUE SPECIMEN

## 2017-07-07 LAB — PATHOLOGY

## 2017-07-12 ENCOUNTER — Ambulatory Visit: Payer: Self-pay | Admitting: Physician Assistant

## 2017-07-18 ENCOUNTER — Other Ambulatory Visit: Payer: Self-pay | Admitting: Physician Assistant

## 2017-07-19 ENCOUNTER — Encounter: Payer: Self-pay | Admitting: Physician Assistant

## 2017-07-19 ENCOUNTER — Ambulatory Visit (INDEPENDENT_AMBULATORY_CARE_PROVIDER_SITE_OTHER): Payer: 59 | Admitting: Physician Assistant

## 2017-07-19 VITALS — BP 130/68 | HR 78 | Temp 97.9°F | Resp 16 | Ht 71.0 in | Wt 290.0 lb

## 2017-07-19 DIAGNOSIS — Z0000 Encounter for general adult medical examination without abnormal findings: Secondary | ICD-10-CM | POA: Diagnosis not present

## 2017-07-19 DIAGNOSIS — Z114 Encounter for screening for human immunodeficiency virus [HIV]: Secondary | ICD-10-CM

## 2017-07-19 MED ORDER — LEVOTHYROXINE SODIUM 300 MCG PO TABS: 300.0000 ug | ORAL_TABLET | Freq: Every day | ORAL | 3 refills | Status: DC

## 2017-07-19 NOTE — Progress Notes (Signed)
Patient ID: Carla Huffman MRN: [id number], DOB: 05-Aug-1971, 46 y.o. Date of Encounter: 07/19/2017,   Chief Complaint: Physical (CPE)  HPI: 46 y.o. y/o female  here for CPE.     06/30/2017: 46 y.o. year old female  presents as a New Patient to Establish Care.   She reports that she is just changing PCP offices.  Had been going to an [place] office.   Her last visit there was about 6 months ago.   Says that she would go there routinely to check lab to monitor her thyroid and also to get medication to use as needed for anxiety.  She uses Xanax as needed for anxiety but says that she uses it very rarely-- probably about once every 2 weeks.  She has her last prescription bottle with her and this was dated 11/11/16 for #60.  Says that that has lasted all of this time. Says that the prescription usually does always last between 2-1/2  -- 3 months.  The main things that she wanted to do at today's visit was to check TSH and get refill on Xanax.  Also states that she has a lesion on the front of her neck that she wants to get removed.  Says that it just developed/appeared about 3 months ago.  Says that her son has a small little dog that frequently jumps on her and seems to scratch/rub it and get it irritated.  I also asked if she had had a complete physical exam or had her cholesterol checked recently etc.  Says that her PCP did check cholesterol about 1 year ago. However says that she "has gained about 40 pounds since then so her cholesterol probably should be rechecked."  She is agreeable to return for a complete physical exam in the future.  She does see a Theatre manager, Dr. Huntley Dec.  No other concerns to address today.  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 However she says that she does have time available between 3:00 to 4:00 PM between the jobs and also is available on Friday  afternoons.    07/19/2017: Today the only additional information she has to add: She is requesting that I order a mammogram even though she does see Dr. Huntley Dec as her gynecologist. She reports that she has only had one mammogram-- at age 37-- as a baseline. However she is now wanting a follow-up screening mammogram because her half sister was diagnosed with breast cancer 4 months ago. because her half sister was diagnosed with breast cancer 4 months ago. This half sister has the same biologic FATHER as patient. Half-sister was diagnosed with breast cancer at age 46. Patients mother was diagnosed with breast cancer at age 46.  She has no other concerns to address today.    Review of Systems: Consitutional: No fever, chills, fatigue, night sweats, lymphadenopathy. No significant/unexplained weight changes. Eyes: No visual changes, eye redness, or discharge. ENT/Mouth: No ear pain, sore throat, nasal drainage, or sinus pain. Cardiovascular: No chest pressure,heaviness, tightness or squeezing, even with exertion. No increased shortness of breath or dyspnea on exertion.No palpitations, edema, orthopnea, PND. Respiratory: No cough, hemoptysis, SOB, or wheezing. Gastrointestinal: No anorexia, dysphagia, reflux, pain, nausea, vomiting, hematemesis, diarrhea, constipation, BRBPR, or melena. Breast: No mass, nodules, bulging, or retraction. No skin changes or inflammation. No nipple discharge. No lymphadenopathy. Genitourinary: No dysuria, hematuria, incontinence, vaginal discharge, pruritis, burning, abnormal bleeding, or pain. Musculoskeletal: No decreased ROM, No joint pain or swelling. No significant pain in neck, back, or  extremities. Skin: No rash, pruritis, or concerning lesions. Neurological: No headache, dizziness, syncope, seizures, tremors, memory loss, coordination problems, or paresthesias. Psychological: No anxiety, depression, hallucinations, SI/HI. Endocrine: No polydipsia, polyphagia, polyuria, or known diabetes.No increased fatigue. No  palpitations/rapid heart rate. No significant/unexplained weight change. All other systems were reviewed and are otherwise negative.  Past Medical History:  Diagnosis Date  . Anxiety   . Hypertension   . Hypothyroidism      Past Surgical History:  Procedure Laterality Date  . CESAREAN SECTION    . THYROIDECTOMY      Home Meds:  Outpatient Medications Prior to Visit  Medication Sig Dispense Refill  . ALPRAZolam (XANAX) 0.5 MG tablet Take 1 tablet (0.5 mg total) by mouth 2 (two) times daily as needed for anxiety. 60 tablet 0  . levothyroxine (SYNTHROID, LEVOTHROID) 300 MCG tablet Take 1 tablet (300 mcg total) by mouth daily before breakfast. 30 tablet 3  . Multiple Vitamin (MULTIVITAMIN WITH MINERALS) TABS tablet Take 1 tablet by mouth daily.     No facility-administered medications prior to visit.     Allergies: No Known Allergies  Social History   Social History  . Marital status: Married    Spouse name: N/A  . Number of children: N/A  . Years of education: N/A   Occupational History  . Not on file.   Social History Main Topics  . Smoking status: Former Games developer  . Smokeless tobacco: Never Used  . Alcohol use Yes     Comment: ocassionaly  . Drug use: No  . Sexual activity: Not on file   Other Topics Concern  . Not on file   Social History Narrative  . No narrative on file    Family History  Problem Relation Age of Onset  . Breast cancer Mother 110  . Brain cancer Father 54  . Breast cancer Other 39       Half sister--same father    Physical Exam: Blood pressure 130/68, pulse 78, temperature 97.9 F (36.6 C), temperature source Oral, resp. rate 16, height  (1.803 m), weight 131.5 kg (290 lb), last menstrual period 07/16/2017, SpO2 97 %., Body mass index is 40.45 kg/m. General: Obese wF. Appears in no acute distress. HEENT: Normocephalic, atraumatic. Conjunctiva pink, sclera non-icteric. Pupils 2 mm constricting to 1 mm, round, regular, and equally  reactive to light and accomodation. EOMI. Internal auditory canal clear. TMs with good cone of light and without pathology. Nasal mucosa pink. Nares are without discharge. No sinus tenderness. Oral mucosa pink.  Pharynx without exudate.   Neck: Supple. Trachea midline. No thyromegaly. Full ROM. No lymphadenopathy.No Carotid Bruits. Lungs: Clear to auscultation bilaterally without wheezes, rales, or rhonchi. Breathing is of normal effort and unlabored. Cardiovascular: RRR with S1 S2. No murmurs, rubs, or gallops. Distal pulses 2+ symmetrically. No carotid or abdominal bruits. Breast: Per Gyn Abdomen: Soft, non-tender, non-distended with normoactive bowel sounds. No hepatosplenomegaly or masses. No rebound/guarding. No CVA tenderness. No hernias.  Genitourinary: Per Gyn Musculoskeletal: Full range of motion and 5/5 strength throughout. Skin: Warm and moist without erythema, ecchymosis, wounds, or rash. Neuro: A+Ox3. CN II-XII grossly intact. Moves all extremities spontaneously. Full sensation throughout. Normal gait. Psych:  Responds to questions appropriately with a normal affect.   Assessment/Plan:  46 y.o. y/o female here for CPE  1. Encounter for preventive health examination  A. Screening Labs: - HIV antibody - CBC with Differential/Platelet - COMPLETE METABOLIC PANEL WITH GFR - Lipid panel  B. Pap:  Per GYN, Dr. Franco Collet. Screening Mammogram: She is requesting that I order a mammogram even though she does see Dr. Huntley Dec as her gynecologist. She reports that she has only had one mammogram-- at age 89-- as a baseline. However she is now wanting a follow-up screening mammogram because her half sister was diagnosed with breast cancer 4 months ago. because her half sister was diagnosed with breast cancer 4 months ago. This half sister has the same biologic FATHER as patient. Half-sister was diagnosed with breast cancer at age 43. Patients mother was diagnosed with breast cancer at age 75. - MM DIGITAL SCREENING BILATERAL; Future  D. DEXA/BMD:  Can wait  to have bone density scan.  E. Colorectal Cancer Screening: No indication to have this until age 72  F. Immunizations:  Influenza: She received this at prior visit here. Tetanus: she reports that she had tetanus vaccine 3 years ago. Will enter this into computer as 10/05/2013 Pneumococcal: she has no indication to require a pneumonia vaccine until age 7. Reports that she has not smoked in > 25 years. Shingrix: will discuss at age 33   2. Screening for HIV (human immunodeficiency virus) - HIV antibody      Signed, [address] [place], [place], [facility] 07/19/2017 8:41 AM

## 2017-07-20 LAB — CBC WITH DIFFERENTIAL/PLATELET
BASOS ABS: 31 {cells}/uL (ref 0–200)
Basophils Relative: 0.4 %
EOS ABS: 100 {cells}/uL (ref 15–500)
Eosinophils Relative: 1.3 %
HCT: 38.6 % (ref 35.0–45.0)
HEMOGLOBIN: 12.3 g/dL (ref 11.7–15.5)
LYMPHS ABS: 2210 {cells}/uL (ref 850–3900)
MCH: 27.3 pg (ref 27.0–33.0)
MCHC: 31.9 g/dL — AB (ref 32.0–36.0)
MCV: 85.8 fL (ref 80.0–100.0)
MPV: 9.6 fL (ref 7.5–12.5)
Monocytes Relative: 5.7 %
NEUTROS PCT: 63.9 %
Neutro Abs: 4920 cells/uL (ref 1500–7800)
Platelets: 297 10*3/uL (ref 140–400)
RBC: 4.5 10*6/uL (ref 3.80–5.10)
RDW: 13.7 % (ref 11.0–15.0)
TOTAL LYMPHOCYTE: 28.7 %
WBC: 7.7 10*3/uL (ref 3.8–10.8)
WBCMIX: 439 {cells}/uL (ref 200–950)

## 2017-07-20 LAB — COMPLETE METABOLIC PANEL WITH GFR
AG RATIO: 1 (calc) (ref 1.0–2.5)
ALBUMIN MSPROF: 3.6 g/dL (ref 3.6–5.1)
ALKALINE PHOSPHATASE (APISO): 53 U/L (ref 33–115)
ALT: 11 U/L (ref 6–29)
AST: 28 U/L (ref 10–35)
BILIRUBIN TOTAL: 0.4 mg/dL (ref 0.2–1.2)
BUN: 11 mg/dL (ref 7–25)
CHLORIDE: 105 mmol/L (ref 98–110)
CO2: 23 mmol/L (ref 20–32)
Calcium: 7.5 mg/dL — ABNORMAL LOW (ref 8.6–10.2)
Creat: 0.68 mg/dL (ref 0.50–1.10)
GFR, Est African American: 122 mL/min/{1.73_m2} (ref >=60)
GFR, Est Non African American: 105 mL/min/{1.73_m2} (ref >=60)
GLUCOSE: 86 mg/dL (ref 65–99)
Globulin: 3.5 g/dL (calc) (ref 1.9–3.7)
POTASSIUM: 4.2 mmol/L (ref 3.5–5.3)
SODIUM: 138 mmol/L (ref 135–146)
Total Protein: 7.1 g/dL (ref 6.1–8.1)

## 2017-07-20 LAB — LIPID PANEL
CHOLESTEROL: 161 mg/dL (ref <200)
HDL: 42 mg/dL — ABNORMAL LOW (ref >50)
LDL CHOLESTEROL (CALC): 100 mg/dL — AB
Non-HDL Cholesterol (Calc): 119 mg/dL (calc) (ref <130)
Total CHOL/HDL Ratio: 3.8 (calc) (ref <5.0)
Triglycerides: 95 mg/dL (ref <150)

## 2017-07-20 LAB — HIV ANTIBODY (ROUTINE TESTING W REFLEX): HIV 1&2 Ab, 4th Generation: NONREACTIVE

## 2017-07-28 ENCOUNTER — Ambulatory Visit (INDEPENDENT_AMBULATORY_CARE_PROVIDER_SITE_OTHER): Payer: 59 | Admitting: Family Medicine

## 2017-07-28 ENCOUNTER — Encounter: Payer: Self-pay | Admitting: Family Medicine

## 2017-07-28 VITALS — BP 132/62 | HR 80 | Temp 98.1°F | Resp 14 | Ht 71.0 in | Wt 295.0 lb

## 2017-07-28 DIAGNOSIS — S91311A Laceration without foreign body, right foot, initial encounter: Secondary | ICD-10-CM

## 2017-07-28 MED ORDER — HYDROCODONE-ACETAMINOPHEN 5-325 MG PO TABS: 1.0000 | ORAL_TABLET | Freq: Four times a day (QID) | ORAL | 0 refills | Status: DC | PRN

## 2017-07-28 MED ORDER — CEPHALEXIN 500 MG PO CAPS: 500.0000 mg | ORAL_CAPSULE | Freq: Two times a day (BID) | ORAL | 0 refills | Status: DC

## 2017-07-28 NOTE — Progress Notes (Signed)
   Subjective:    Patient ID: Carla Huffman, female    DOB: 08/01/1971, 46 y.o.   MRN: [id number]  Patient presents for R Foot Injury (screen door hit R heel- bleed copious amounts and is currently very painful)  Patient here with right foot injury.  Last night she was going up the screen door to let her dogs out when a warm door caught her foot causing a laceration.  She was able to stop bleeding by applying pressure but this morning she had some mild bleeding again therefore came in for a visit.  Her tetanus is up-to-date this was done in 2017 she has not had any odor or drainage otherwise from the wound.  She did have it wrapped up up on entry into the exam room.  She took a couple of doses of ibuprofen, which has not helped with the pain   Review Of Systems:  GEN- denies fatigue, fever, weight loss,weakness, recent illness HEENT- denies eye drainage, change in vision, nasal discharge, CVS- denies chest pain, palpitations RESP- denies SOB, cough, wheeze ABD- denies N/V, change in stools, abd pain GU- denies dysuria, hematuria, dribbling, incontinence MSK- + joint pain, muscle aches, injury Neuro- denies headache, dizziness, syncope, seizure activity       Objective:    BP 132/62   Pulse 80   Temp 98.1 F (36.7 C) (Oral)   Resp 14   Ht 5\' 11"  (1.803 m)   Wt 295 lb (133.8 kg)   LMP 07/16/2017 (Exact Date) Comment: regular  SpO2 97%   BMI 41.14 kg/m  GEN- NAD, alert and oriented x3 Right foot- 5 inch superficial laceration, mild jagged edges at center , small abrasion to left of laceration, mild TTP, no erythema, no active bleeding Able to bear weight on heel Cracked callus heels  Pulse- DP/PT 2+  MSK- Good ROM ankle/foot          Assessment & Plan:      Problem List Items Addressed This Visit    None    Visit Diagnoses    Laceration of right foot, initial encounter    -  Primary   Wound cleaned at bedside, steri strips applied, antibiotic pointment to  adjacent abrasions,bandage. Keflex due to nature of laceration Norco #10 given, note for one her to elevate her foot tonight to keep the swelling down as the laceration heals over.  Is able to bear weight I doubt any fracture      Note: This dictation was prepared with Dragon dictation along with smaller phrase technology. Any transcriptional errors that result from this process are unintentional.

## 2017-07-28 NOTE — Patient Instructions (Signed)
Keep area clean and dry Take antibiotics  Keep covered until healed F/U as needed

## 2017-07-29 ENCOUNTER — Telehealth: Payer: Self-pay | Admitting: *Deleted

## 2017-07-29 MED ORDER — SULFAMETHOXAZOLE-TRIMETHOPRIM 800-160 MG PO TABS: 1.0000 | ORAL_TABLET | Freq: Two times a day (BID) | ORAL | 0 refills | Status: DC

## 2017-07-29 NOTE — Telephone Encounter (Signed)
Agree with everything you already told her.  Once the itching and all allergic reaction symptoms resolve---then---start new abx: Bactrim DS 1 po BID x 7 days # 14 +0

## 2017-07-29 NOTE — Telephone Encounter (Signed)
Call placed to patient and patient made aware.   Prescription sent to pharmacy.  

## 2017-07-29 NOTE — Telephone Encounter (Signed)
Received call from patient.   Reports that she started Keflex for laceration of foot last night. Reports that she has extreme generalized itching this morning. States that she thinks she may be allergic.   Denies SOB, itching in throat, chest tightness. Advised to take Benadryl PO for itching and stop ABTx. Please advise.

## 2017-08-10 ENCOUNTER — Ambulatory Visit
Admission: RE | Admit: 2017-08-10 | Discharge: 2017-08-10 | Disposition: A | Discharge status: Home / Self Care | Payer: PRIVATE HEALTH INSURANCE | Source: Ambulatory Visit | Attending: Physician Assistant | Admitting: Physician Assistant

## 2017-08-10 DIAGNOSIS — Z1231 Encounter for screening mammogram for malignant neoplasm of breast: Secondary | ICD-10-CM | POA: Diagnosis not present

## 2017-08-10 DIAGNOSIS — Z0000 Encounter for general adult medical examination without abnormal findings: Secondary | ICD-10-CM

## 2017-08-10 LAB — HM MAMMOGRAPHY

## 2017-10-11 ENCOUNTER — Telehealth: Payer: Self-pay

## 2017-10-11 NOTE — Telephone Encounter (Signed)
error 

## 2017-10-12 ENCOUNTER — Encounter: Payer: Self-pay | Admitting: Family Medicine

## 2017-10-12 ENCOUNTER — Ambulatory Visit: Payer: 59 | Admitting: Family Medicine

## 2017-10-12 VITALS — BP 126/74 | HR 82 | Temp 98.4°F | Resp 18 | Ht 71.0 in | Wt 288.0 lb

## 2017-10-12 DIAGNOSIS — B9689 Other specified bacterial agents as the cause of diseases classified elsewhere: Secondary | ICD-10-CM

## 2017-10-12 DIAGNOSIS — J019 Acute sinusitis, unspecified: Secondary | ICD-10-CM | POA: Diagnosis not present

## 2017-10-12 MED ORDER — HYDROCODONE-HOMATROPINE 5-1.5 MG/5ML PO SYRP: 5.0000 mL | ORAL_SOLUTION | Freq: Three times a day (TID) | ORAL | 0 refills | Status: DC | PRN

## 2017-10-12 MED ORDER — AZITHROMYCIN 250 MG PO TABS: ORAL_TABLET | ORAL | 0 refills | Status: DC

## 2017-10-12 NOTE — Progress Notes (Signed)
Subjective:    Patient ID: Carla Huffman, female    DOB: 1971-07-02, 47 y.o.   MRN: [id number]  HPI  Patient is a 47 year old white female who presents with more than a week of sinus pain, pain is primarily in her left frontal and left maxillary sinus. She reports pain in her left upper teeth. She reports pain in her left ear. She reports subjective fevers. She also reports cough secondary to postnasal drip and a tickle in her throat although she denies any chest pain or fevers or shortness of breath. Cough is nonproductive. Past Medical History:  Diagnosis Date  . Anxiety   . Hypertension   . Hypothyroidism    Past Surgical History:  Procedure Laterality Date  . CESAREAN SECTION    . THYROIDECTOMY     Current Outpatient Medications on File Prior to Visit  Medication Sig Dispense Refill  . ALPRAZolam (XANAX) 0.5 MG tablet Take 1 tablet (0.5 mg total) by mouth 2 (two) times daily as needed for anxiety. 60 tablet 0  . levothyroxine (SYNTHROID, LEVOTHROID) 300 MCG tablet Take 1 tablet (300 mcg total) by mouth daily before breakfast. 30 tablet 3  . Multiple Vitamin (MULTIVITAMIN WITH MINERALS) TABS tablet Take 1 tablet by mouth daily.    [facility] [employer] OVER THE COUNTER MEDICATION TART CHERRY EXTRACT    . sulfamethoxazole-trimethoprim (BACTRIM DS,SEPTRA DS) 800-160 MG tablet Take 1 tablet by mouth 2 (two) times daily. 14 tablet 0   No current facility-administered medications on file prior to visit.    Allergies  Allergen Reactions  . Keflex [Cephalexin] Itching   Social History   Socioeconomic History  . Marital status: Married    Spouse name: Not on file  . Number of children: Not on file  . Years of education: Not on file  . Highest education level: Not on file  Social Needs  . Financial resource strain: Not on file  . Food insecurity - worry: Not on file  . Food insecurity - inability: Not on file  . Transportation needs - medical: Not on file  . Transportation needs - non-medical:  Not on file  Occupational History  . Not on file  Tobacco Use  . Smoking status: Former Games developer  . Smokeless tobacco: Never Used  Substance and Sexual Activity  . Alcohol use: Yes    Comment: ocassionaly  . Drug use: No  . Sexual activity: Not on file  Other Topics Concern  . Not on file  Social History Narrative  . Not on file     Review of Systems  All other systems reviewed and are negative.      Objective:   Physical Exam  Constitutional: She appears well-developed and well-nourished.  HENT:  Right Ear: Tympanic membrane, external ear and ear canal normal.  Left Ear: External ear normal. Tympanic membrane is bulging. A middle ear effusion is present.  Nose: Mucosal edema and rhinorrhea present. Left sinus exhibits maxillary sinus tenderness and frontal sinus tenderness.  Mouth/Throat: Oropharynx is clear and moist. No oropharyngeal exudate.  Eyes: Conjunctivae are normal.  Neck: Neck supple.  Cardiovascular: Normal rate, regular rhythm and normal heart sounds.  Pulmonary/Chest: Effort normal and breath sounds normal. No respiratory distress. She has no wheezes. She has no rales.  Lymphadenopathy:    She has no cervical adenopathy.  Vitals reviewed.         Assessment & Plan:  Acute bacterial rhinosinusitis - Plan: azithromycin (ZITHROMAX) 250 MG tablet, HYDROcodone-homatropine (HYCODAN) 5-1.5 MG/5ML syrup  Begin a Z-Pak for sinusitis given her history to penicillin allergies. Use Hycodan 1 teaspoon every 6-8 hours as needed for cough. Use Flonase for nasal congestion.

## 2017-10-19 ENCOUNTER — Encounter: Payer: Self-pay | Admitting: Physician Assistant

## 2017-10-19 ENCOUNTER — Telehealth: Payer: Self-pay | Admitting: Family Medicine

## 2017-10-19 DIAGNOSIS — R05 Cough: Secondary | ICD-10-CM

## 2017-10-19 DIAGNOSIS — R053 Chronic cough: Secondary | ICD-10-CM

## 2017-10-19 MED ORDER — FLUTICASONE PROPIONATE 50 MCG/ACT NA SUSP: 2.0000 | Freq: Every day | NASAL | 6 refills | Status: DC

## 2017-10-19 NOTE — Telephone Encounter (Signed)
Begin flonase for sinsuitis and get cxr.

## 2017-10-19 NOTE — Telephone Encounter (Signed)
Pt called Carla Huffman stating that she finished the antibx but is still having a really bad cough and her sinuses are not much better. Please advise.

## 2017-10-19 NOTE — Telephone Encounter (Signed)
Med sent to pharm, cxr ordered & pt aware

## 2017-10-20 ENCOUNTER — Ambulatory Visit
Admission: RE | Admit: 2017-10-20 | Discharge: 2017-10-20 | Disposition: A | Discharge status: Home / Self Care | Payer: 59 | Source: Ambulatory Visit | Attending: Family Medicine | Admitting: Family Medicine

## 2017-10-20 DIAGNOSIS — R05 Cough: Secondary | ICD-10-CM | POA: Diagnosis not present

## 2017-10-20 DIAGNOSIS — R053 Chronic cough: Secondary | ICD-10-CM

## 2017-10-21 ENCOUNTER — Other Ambulatory Visit: Payer: Self-pay | Admitting: Family Medicine

## 2017-10-21 MED ORDER — LEVOFLOXACIN 500 MG PO TABS: 500.0000 mg | ORAL_TABLET | Freq: Every day | ORAL | 0 refills | Status: DC

## 2017-11-16 ENCOUNTER — Encounter: Payer: Self-pay | Admitting: Physician Assistant

## 2018-01-03 ENCOUNTER — Other Ambulatory Visit: Payer: Self-pay | Admitting: Physician Assistant

## 2018-01-17 ENCOUNTER — Ambulatory Visit: Payer: 59 | Admitting: Physician Assistant

## 2018-01-31 DIAGNOSIS — H52223 Regular astigmatism, bilateral: Secondary | ICD-10-CM | POA: Diagnosis not present

## 2018-06-30 ENCOUNTER — Other Ambulatory Visit: Payer: Self-pay | Admitting: Physician Assistant

## 2018-06-30 DIAGNOSIS — Z1231 Encounter for screening mammogram for malignant neoplasm of breast: Secondary | ICD-10-CM

## 2018-07-28 ENCOUNTER — Ambulatory Visit: Payer: 59

## 2018-08-09 ENCOUNTER — Other Ambulatory Visit: Payer: Self-pay

## 2018-08-09 ENCOUNTER — Emergency Department (HOSPITAL_COMMUNITY): Payer: 59

## 2018-08-09 ENCOUNTER — Encounter (HOSPITAL_COMMUNITY): Payer: Self-pay | Admitting: *Deleted

## 2018-08-09 ENCOUNTER — Emergency Department (HOSPITAL_COMMUNITY)
Admission: EM | Admit: 2018-08-09 | Discharge: 2018-08-09 | Disposition: A | Discharge status: Home / Self Care | Payer: 59 | Attending: Emergency Medicine | Admitting: Emergency Medicine

## 2018-08-09 DIAGNOSIS — J069 Acute upper respiratory infection, unspecified: Secondary | ICD-10-CM | POA: Diagnosis not present

## 2018-08-09 DIAGNOSIS — I10 Essential (primary) hypertension: Secondary | ICD-10-CM | POA: Insufficient documentation

## 2018-08-09 DIAGNOSIS — R0600 Dyspnea, unspecified: Secondary | ICD-10-CM | POA: Diagnosis not present

## 2018-08-09 DIAGNOSIS — Z79899 Other long term (current) drug therapy: Secondary | ICD-10-CM | POA: Insufficient documentation

## 2018-08-09 DIAGNOSIS — Z87891 Personal history of nicotine dependence: Secondary | ICD-10-CM | POA: Insufficient documentation

## 2018-08-09 DIAGNOSIS — E039 Hypothyroidism, unspecified: Secondary | ICD-10-CM | POA: Diagnosis not present

## 2018-08-09 DIAGNOSIS — R079 Chest pain, unspecified: Secondary | ICD-10-CM | POA: Diagnosis not present

## 2018-08-09 DIAGNOSIS — R0602 Shortness of breath: Secondary | ICD-10-CM | POA: Diagnosis present

## 2018-08-09 LAB — BASIC METABOLIC PANEL
Anion gap: 11 (ref 5–15)
BUN: 19 mg/dL (ref 6–20)
CHLORIDE: 103 mmol/L (ref 98–111)
CO2: 24 mmol/L (ref 22–32)
Calcium: 8.4 mg/dL — ABNORMAL LOW (ref 8.9–10.3)
Creatinine, Ser: 1.24 mg/dL — ABNORMAL HIGH (ref 0.44–1.00)
GFR calc Af Amer: 59 mL/min — ABNORMAL LOW (ref >60)
GFR calc non Af Amer: 51 mL/min — ABNORMAL LOW (ref >60)
GLUCOSE: 95 mg/dL (ref 70–99)
Potassium: 3.9 mmol/L (ref 3.5–5.1)
Sodium: 138 mmol/L (ref 135–145)

## 2018-08-09 LAB — CBC
HEMATOCRIT: 42.3 % (ref 36.0–46.0)
HEMOGLOBIN: 12.7 g/dL (ref 12.0–15.0)
MCH: 27.8 pg (ref 26.0–34.0)
MCHC: 30 g/dL (ref 30.0–36.0)
MCV: 92.6 fL (ref 80.0–100.0)
Platelets: 314 10*3/uL (ref 150–400)
RBC: 4.57 MIL/uL (ref 3.87–5.11)
RDW: 15.3 % (ref 11.5–15.5)
WBC: 9.1 10*3/uL (ref 4.0–10.5)
nRBC: 0 % (ref 0.0–0.2)

## 2018-08-09 LAB — I-STAT BETA HCG BLOOD, ED (MC, WL, AP ONLY): I-stat hCG, quantitative: <5 m[IU]/mL (ref <5)

## 2018-08-09 LAB — I-STAT TROPONIN, ED: Troponin i, poc: 0.01 ng/mL (ref 0.00–0.08)

## 2018-08-09 MED ORDER — GUAIFENESIN ER 600 MG PO TB12: 600.0000 mg | ORAL_TABLET | Freq: Two times a day (BID) | ORAL | 0 refills | Status: DC

## 2018-08-09 MED ORDER — ALBUTEROL SULFATE HFA 108 (90 BASE) MCG/ACT IN AERS: 1.0000 | INHALATION_SPRAY | Freq: Four times a day (QID) | RESPIRATORY_TRACT | 0 refills | Status: DC | PRN

## 2018-08-09 NOTE — ED Notes (Signed)
Patient verbalizes understanding of discharge instructions. Opportunity for questioning and answers were provided. Armband removed by staff, pt discharged from ED ambulatory.   

## 2018-08-09 NOTE — ED Triage Notes (Signed)
Pt in c/o SOB onset last night with 1 episode of  Mid radiating cp yesterday  Lasting "just a second" pt has upper respiratory congestion, pt c/o non productive cough, A&Ox4

## 2018-08-09 NOTE — Discharge Instructions (Addendum)
The medications as prescribed, follow-up with your primary care doctor if not improving in the next week, return as needed for worsening symptoms

## 2018-08-09 NOTE — ED Provider Notes (Signed)
[facility] EMERGENCY DEPARTMENT Provider Note   CSN: [id number] Arrival date & time: 08/09/18  4098     History   Chief Complaint Chief Complaint  Patient presents with  . Shortness of Breath    HPI Carla Huffman is a 47 y.o. female.  HPI Pt started having trouble breathing around 3 am.  She thought it was her sinuses at first but sinus meds did not help.  Pt feels like the symptoms are improving but not resolved.  No wheezing but she does cough at night.  No CP or leg swelling.  No hx of DVT, PE or CHF. Past Medical History:  Diagnosis Date  . Anxiety   . Hypertension   . Hypothyroidism     Patient Active Problem List   Diagnosis Date Noted  . Hypothyroidism 06/30/2017  . Generalized anxiety disorder 06/30/2017    Past Surgical History:  Procedure Laterality Date  . CESAREAN SECTION    . THYROIDECTOMY       OB History   None      Home Medications    Prior to Admission medications   Medication Sig Start Date End Date Taking? Authorizing Provider  albuterol (PROVENTIL HFA;VENTOLIN HFA) 108 (90 Base) MCG/ACT inhaler Inhale 1-2 puffs into the lungs every 6 (six) hours as needed for wheezing or shortness of breath. 08/09/18   Linwood Dibbles, MD  ALPRAZolam Prudy Feeler) 0.5 MG tablet Take 1 tablet (0.5 mg total) by mouth 2 (two) times daily as needed for anxiety. 06/30/17   Dorena Bodo, PA-C  azithromycin (ZITHROMAX) 250 MG tablet 2 tabs poqday1, 1 tab poqday 2-5 10/12/17   Donita Brooks, MD  fluticasone [facility]) 50 MCG/ACT nasal spray Place 2 sprays into both nostrils daily. 10/19/17   Donita Brooks, MD  guaiFENesin (MUCINEX) 600 MG 12 hr tablet Take 1 tablet (600 mg total) by mouth 2 (two) times daily. 08/09/18   Linwood Dibbles, MD  HYDROcodone-homatropine [facility]) 5-1.5 MG/5ML syrup Take 5 mLs by mouth every 8 (eight) hours as needed for cough. 10/12/17   Donita Brooks, MD  levofloxacin (LEVAQUIN) 500 MG tablet Take 1 tablet (500 mg total) by  mouth daily. 10/21/17   Donita Brooks, MD  levothyroxine (SYNTHROID, LEVOTHROID) 300 MCG tablet Take 1 tablet (300 mcg total) by mouth daily before breakfast. 07/19/17   Dorena Bodo, PA-C  Multiple Vitamin (MULTIVITAMIN WITH MINERALS) TABS tablet Take 1 tablet by mouth daily.    [provider]  OVER THE COUNTER MEDICATION TART CHERRY EXTRACT    [provider]  sulfamethoxazole-trimethoprim (BACTRIM DS,SEPTRA DS) 800-160 MG tablet Take 1 tablet by mouth 2 (two) times daily. 07/29/17   Dorena Bodo, PA-C    Family History Family History  Problem Relation Age of Onset  . Breast cancer Mother 22  . Brain cancer Father 67  . Breast cancer Other 27       Half sister--same father    Social History Social History   Tobacco Use  . Smoking status: Former Smoker    Last attempt to quit: 10/06/1995    Years since quitting: 22.8  . Smokeless tobacco: Never Used  Substance Use Topics  . Alcohol use: Yes    Comment: ocassionaly  . Drug use: No     Allergies   Keflex [cephalexin]   Review of Systems Review of Systems  Constitutional: Positive for chills. Negative for fever.  Respiratory: Positive for cough (some at night).   Gastrointestinal: Negative  for blood in stool.  All other systems reviewed and are negative.    Physical Exam Updated Vital Signs BP (!) 146/106 (BP Location: Right Arm)   Pulse 78   Temp 97.9 F (36.6 C) (Oral)   Resp 17   Ht 1.829 m (6')   Wt 127 kg   LMP 07/31/2018   SpO2 98%   BMI 37.97 kg/m   Physical Exam  Constitutional: She appears well-developed and well-nourished. No distress.  HENT:  Head: Normocephalic and atraumatic.  Right Ear: External ear normal.  Left Ear: External ear normal.  Eyes: Conjunctivae are normal. Right eye exhibits no discharge. Left eye exhibits no discharge. No scleral icterus.  Neck: Neck supple. No tracheal deviation present.  Cardiovascular: Normal rate, regular rhythm and intact distal  pulses.  Pulmonary/Chest: Effort normal and breath sounds normal. No stridor. No respiratory distress. She has no wheezes. She has no rales.  Abdominal: Soft. Bowel sounds are normal. She exhibits no distension. There is no tenderness. There is no rebound and no guarding.  Musculoskeletal: She exhibits no edema or tenderness.  Neurological: She is alert. She has normal strength. No cranial nerve deficit (no facial droop, extraocular movements intact, no slurred speech) or sensory deficit. She exhibits normal muscle tone. She displays no seizure activity. Coordination normal.  Skin: Skin is warm and dry. No rash noted.  Psychiatric: She has a normal mood and affect.  Nursing note and vitals reviewed.    ED Treatments / Results  Labs (all labs ordered are listed, but only abnormal results are displayed) Labs Reviewed  BASIC METABOLIC PANEL - Abnormal; Notable for the following components:      Result Value   Creatinine, Ser 1.24 (*)    Calcium 8.4 (*)    GFR calc non Af Amer 51 (*)    GFR calc Af Amer 59 (*)    All other components within normal limits  CBC  I-STAT TROPONIN, ED  I-STAT BETA HCG BLOOD, ED ([facility], [facility], [facility] ONLY)    EKG EKG Interpretation  Date/Time:  Tuesday August 09 2018 07:00:10 EST Ventricular Rate:  80 PR Interval:  154 QRS Duration: 84 QT Interval:  404 QTC Calculation: 465 R Axis:   57 Text Interpretation:  Normal sinus rhythm ST & T wave abnormality, consider inferolateral ischemia , more pronounced compared to last tracing Prolonged QT Abnormal ECG Confirmed by Linwood Dibbles [id number]) on 08/09/2018 7:46:11 AM   Radiology Dg Chest 2 View  Result Date: 08/09/2018 CLINICAL DATA:  Chest pain. EXAM: CHEST - 2 VIEW COMPARISON:  Radiographs of October 20, 2017. FINDINGS: Stable cardiomediastinal silhouette. No pneumothorax or pleural effusion is noted. Both lungs are clear. The visualized skeletal structures are unremarkable. IMPRESSION: No active cardiopulmonary  disease. Electronically Signed   By: Lupita Raider, M.D.   On: 08/09/2018 07:48    Procedures Procedures (including critical care time)  Medications Ordered in ED Medications - No data to display   Initial Impression / Assessment and Plan / ED Course  I have reviewed the triage vital signs and the nursing notes.  Pertinent labs & imaging results that were available during my care of the patient were reviewed by me and considered in my medical decision making (see chart for details).  Clinical Course as of Aug 10 911  Tue Aug 09, 2018  6045 Low risk wells criteria and negative PERC   [JK]    Clinical Course User Index [JK] Linwood Dibbles, MD    Patient  presented to the emergency room for evaluation of shortness of breath.  Her symptoms were resolving by the time she was seen in the ED.  Patient's ED exam is reassuring.  She is breathing easily.  She has normal oxygenation.  Laboratory tests and x-rays are negative.  No signs of pneumonia or heart failure.  Patient is low risk for PE.  I doubt pulmonary embolism or acute coronary syndrome.  Symptoms may be related to a viral illness.  Will dc home with meds below.  Final Clinical Impressions(s) / ED Diagnoses   Final diagnoses:  Dyspnea, unspecified type  Upper respiratory tract infection, unspecified type    ED Discharge Orders         Ordered    guaiFENesin (MUCINEX) 600 MG 12 hr tablet  2 times daily     08/09/18 0857    albuterol (PROVENTIL HFA;VENTOLIN HFA) 108 (90 Base) MCG/ACT inhaler  Every 6 hours PRN     08/09/18 0857           Linwood Dibbles, MD 08/09/18 [phone]

## 2018-09-11 ENCOUNTER — Other Ambulatory Visit: Payer: Self-pay

## 2018-09-11 ENCOUNTER — Emergency Department (HOSPITAL_COMMUNITY)
Admission: EM | Admit: 2018-09-11 | Discharge: 2018-09-11 | Disposition: A | Discharge status: Home / Self Care | Payer: 59 | Attending: Emergency Medicine | Admitting: Emergency Medicine

## 2018-09-11 ENCOUNTER — Encounter (HOSPITAL_COMMUNITY): Payer: Self-pay | Admitting: Emergency Medicine

## 2018-09-11 DIAGNOSIS — Z87891 Personal history of nicotine dependence: Secondary | ICD-10-CM | POA: Insufficient documentation

## 2018-09-11 DIAGNOSIS — E039 Hypothyroidism, unspecified: Secondary | ICD-10-CM | POA: Insufficient documentation

## 2018-09-11 DIAGNOSIS — I10 Essential (primary) hypertension: Secondary | ICD-10-CM | POA: Insufficient documentation

## 2018-09-11 DIAGNOSIS — Z79899 Other long term (current) drug therapy: Secondary | ICD-10-CM | POA: Diagnosis not present

## 2018-09-11 DIAGNOSIS — M62830 Muscle spasm of back: Secondary | ICD-10-CM | POA: Diagnosis not present

## 2018-09-11 DIAGNOSIS — M545 Low back pain: Secondary | ICD-10-CM | POA: Diagnosis present

## 2018-09-11 MED ORDER — ORPHENADRINE CITRATE ER 100 MG PO TB12: 100.0000 mg | ORAL_TABLET | Freq: Two times a day (BID) | ORAL | 0 refills | Status: DC

## 2018-09-11 MED ORDER — LIDOCAINE 5 % EX PTCH
1.0000 | MEDICATED_PATCH | CUTANEOUS | Status: DC
  Administered 2018-09-11: 1 via TRANSDERMAL
  Filled 2018-09-11: qty 1

## 2018-09-11 MED ORDER — DICLOFENAC SODIUM 1 % TD GEL: 4.0000 g | Freq: Four times a day (QID) | TRANSDERMAL | 0 refills | Status: DC

## 2018-09-11 NOTE — ED Provider Notes (Signed)
[facility] EMERGENCY DEPARTMENT Provider Note   CSN: [id number] Arrival date & time: 09/11/18  2027     History   Chief Complaint Chief Complaint  Patient presents with  . Back Pain    HPI Carla Huffman is a 47 y.o. female.  47 year old female presents with complaint of left lower back pain.  Patient states pain started 1 week ago as a tender knot in her back, has been trying to massage the area without improvement in her pain.  Denies falls or injuries, denies abdominal pain, pain does not radiate to the legs, no changes in bowel or bladder habits.  Patient states while rubbing her back with her left hand today she noticed tingling in her left fourth and fifth fingers, denies any complaints of chest pain, tingling in the fingers has resolved.  No other complaints or concerns.     Past Medical History:  Diagnosis Date  . Anxiety   . Hypertension   . Hypothyroidism     Patient Active Problem List   Diagnosis Date Noted  . Hypothyroidism 06/30/2017  . Generalized anxiety disorder 06/30/2017    Past Surgical History:  Procedure Laterality Date  . CESAREAN SECTION    . THYROIDECTOMY       OB History   None      Home Medications    Prior to Admission medications   Medication Sig Start Date End Date Taking? Authorizing Provider  albuterol (PROVENTIL HFA;VENTOLIN HFA) 108 (90 Base) MCG/ACT inhaler Inhale 1-2 puffs into the lungs every 6 (six) hours as needed for wheezing or shortness of breath. 08/09/18   Linwood Dibbles, MD  ALPRAZolam Prudy Feeler) 0.5 MG tablet Take 1 tablet (0.5 mg total) by mouth 2 (two) times daily as needed for anxiety. 06/30/17   Dorena Bodo, PA-C  azithromycin (ZITHROMAX) 250 MG tablet 2 tabs poqday1, 1 tab poqday 2-5 10/12/17   Donita Brooks, MD  diclofenac sodium (VOLTAREN) 1 % GEL Apply 4 g topically 4 (four) times daily. 09/11/18   Jeannie Fend, PA-C  fluticasone (FLONASE) 50 MCG/ACT nasal spray Place 2 sprays into both  nostrils daily. 10/19/17   Donita Brooks, MD  guaiFENesin (MUCINEX) 600 MG 12 hr tablet Take 1 tablet (600 mg total) by mouth 2 (two) times daily. 08/09/18   Linwood Dibbles, MD  HYDROcodone-homatropine [facility]) 5-1.5 MG/5ML syrup Take 5 mLs by mouth every 8 (eight) hours as needed for cough. 10/12/17   Donita Brooks, MD  levofloxacin (LEVAQUIN) 500 MG tablet Take 1 tablet (500 mg total) by mouth daily. 10/21/17   Donita Brooks, MD  levothyroxine (SYNTHROID, LEVOTHROID) 300 MCG tablet Take 1 tablet (300 mcg total) by mouth daily before breakfast. 07/19/17   Dorena Bodo, PA-C  Multiple Vitamin (MULTIVITAMIN WITH MINERALS) TABS tablet Take 1 tablet by mouth daily.    [provider]  orphenadrine (NORFLEX) 100 MG tablet Take 1 tablet (100 mg total) by mouth 2 (two) times daily. 09/11/18   Jeannie Fend, PA-C  OVER THE COUNTER MEDICATION TART CHERRY EXTRACT    [provider]  sulfamethoxazole-trimethoprim (BACTRIM DS,SEPTRA DS) 800-160 MG tablet Take 1 tablet by mouth 2 (two) times daily. 07/29/17   Dorena Bodo, PA-C    Family History Family History  Problem Relation Age of Onset  . Breast cancer Mother 20  . Brain cancer Father 37  . Breast cancer Other 39       Half sister--same father  Social History Social History   Tobacco Use  . Smoking status: Former Smoker    Last attempt to quit: 10/06/1995    Years since quitting: 22.9  . Smokeless tobacco: Never Used  Substance Use Topics  . Alcohol use: Yes    Comment: ocassionaly  . Drug use: No     Allergies   Keflex [cephalexin]   Review of Systems Review of Systems  Constitutional: Negative for fever.  Cardiovascular: Negative for chest pain.  Musculoskeletal: Positive for back pain and myalgias. Negative for gait problem.  Skin: Negative for rash and wound.  Allergic/Immunologic: Negative for immunocompromised state.  Neurological: Negative for weakness.  Hematological: Negative for adenopathy.    Psychiatric/Behavioral: Negative for confusion.  All other systems reviewed and are negative.    Physical Exam Updated Vital Signs BP (!) 149/102 (BP Location: Right Arm)   Pulse 90   Temp 98.1 F (36.7 C) (Oral)   Resp 18   Ht 5\' 11"  (1.803 m)   Wt (!) 137.4 kg   SpO2 99%   BMI 42.26 kg/m   Physical Exam  Constitutional: She is oriented to person, place, and time. She appears well-developed and well-nourished. No distress.  HENT:  Head: Normocephalic and atraumatic.  Cardiovascular: Normal rate, regular rhythm, normal heart sounds and intact distal pulses.  No murmur heard. Pulmonary/Chest: Effort normal and breath sounds normal. No respiratory distress.  Abdominal: Soft. There is no tenderness.  Musculoskeletal: She exhibits tenderness. She exhibits no deformity.       Lumbar back: She exhibits no bony tenderness.       Back:  Left lower back pain, no midline tenderness, no rash.  Neurological: She is alert and oriented to person, place, and time. She displays normal reflexes. No sensory deficit.  Skin: Skin is warm and dry. No rash noted. She is not diaphoretic.  Psychiatric: She has a normal mood and affect. Her behavior is normal.  Nursing note and vitals reviewed.    ED Treatments / Results  Labs (all labs ordered are listed, but only abnormal results are displayed) Labs Reviewed - No data to display  EKG None  Radiology No results found.  Procedures Procedures (including critical care time)  Medications Ordered in ED Medications  lidocaine (LIDODERM) 5 % 1 patch (has no administration in time range)     Initial Impression / Assessment and Plan / ED Course  I have reviewed the triage vital signs and the nursing notes.  Pertinent labs & imaging results that were available during my care of the patient were reviewed by me and considered in my medical decision making (see chart for details).  Clinical Course as of Sep 11 2114  Wynelle Link Sep 11, 2018   3423 47 year old female with left lower back pain x1 week.  On exam patient has palpable muscle spasm of lower back with pain reproduced with palpation.  Reflexes are symmetric, normal from lower extremity strength abdomen is soft and nontender.  Patient will be given Lidoderm patch while in the ER as well as prescription for Voltaren gel (history of hypertension) and prescription for muscle relaxer.  Patient was advised to contact her PCP office tomorrow to arrange follow-up.   [LM]    Clinical Course User Index [LM] Jeannie Fend, PA-C   Final Clinical Impressions(s) / ED Diagnoses   Final diagnoses:  Muscle spasm of back    ED Discharge Orders         Ordered    diclofenac sodium (  VOLTAREN) 1 % GEL  4 times daily     09/11/18 2114    orphenadrine (NORFLEX) 100 MG tablet  2 times daily     09/11/18 2114           Jeannie FendMurphy, Laura A, PA-C 09/11/18 2116    Little, Ambrose Finlandachel Morgan, MD 09/12/18 [phone]

## 2018-09-11 NOTE — ED Notes (Signed)
Patient verbalizes understanding of discharge instructions. Opportunity for questioning and answers were provided. Armband removed by staff, pt discharged from ED ambulatory.   

## 2018-09-11 NOTE — Discharge Instructions (Addendum)
Follow-up with your primary care provider this week to assess treatment provided in the ER, possible referral to physical therapy to improve core strength to help with back pain.  Apply Voltaren gel to your back as prescribed for pain. Take Norflex as prescribed as a muscle relaxer, do not take if driving or operating machinery.

## 2018-09-11 NOTE — ED Triage Notes (Signed)
Pt states she has had lower left back pain for about a week. Pt ambulatory at triage.

## 2019-01-19 ENCOUNTER — Other Ambulatory Visit: Payer: Self-pay | Admitting: Family Medicine

## 2019-02-02 ENCOUNTER — Other Ambulatory Visit: Payer: Self-pay | Admitting: Nurse Practitioner

## 2019-02-02 DIAGNOSIS — IMO0001 Reserved for inherently not codable concepts without codable children: Secondary | ICD-10-CM

## 2019-02-02 DIAGNOSIS — R209 Unspecified disturbances of skin sensation: Principal | ICD-10-CM

## 2019-03-08 ENCOUNTER — Other Ambulatory Visit: Payer: Self-pay

## 2019-03-08 ENCOUNTER — Ambulatory Visit
Admission: RE | Admit: 2019-03-08 | Discharge: 2019-03-08 | Disposition: A | Discharge status: Home / Self Care | Payer: 59 | Source: Ambulatory Visit | Attending: Nurse Practitioner | Admitting: Nurse Practitioner

## 2019-03-08 DIAGNOSIS — IMO0001 Reserved for inherently not codable concepts without codable children: Secondary | ICD-10-CM

## 2019-05-11 ENCOUNTER — Other Ambulatory Visit: Payer: Self-pay

## 2019-05-11 DIAGNOSIS — Z20822 Contact with and (suspected) exposure to covid-19: Secondary | ICD-10-CM

## 2019-05-12 LAB — NOVEL CORONAVIRUS, NAA: SARS-CoV-2, NAA: NOT DETECTED

## 2019-05-12 LAB — SPECIMEN STATUS REPORT

## 2019-08-17 ENCOUNTER — Ambulatory Visit: Payer: 59 | Admitting: Podiatry

## 2019-08-19 ENCOUNTER — Ambulatory Visit: Payer: 59 | Admitting: Podiatry

## 2019-08-19 ENCOUNTER — Encounter: Payer: Self-pay | Admitting: Podiatry

## 2019-08-19 ENCOUNTER — Other Ambulatory Visit: Payer: Self-pay

## 2019-08-19 ENCOUNTER — Ambulatory Visit (INDEPENDENT_AMBULATORY_CARE_PROVIDER_SITE_OTHER): Payer: 59

## 2019-08-19 VITALS — BP 153/95 | HR 76 | Resp 16

## 2019-08-19 DIAGNOSIS — M7731 Calcaneal spur, right foot: Secondary | ICD-10-CM | POA: Diagnosis not present

## 2019-08-19 DIAGNOSIS — M7661 Achilles tendinitis, right leg: Secondary | ICD-10-CM

## 2019-08-19 DIAGNOSIS — M722 Plantar fascial fibromatosis: Secondary | ICD-10-CM | POA: Diagnosis not present

## 2019-08-19 DIAGNOSIS — T148XXA Other injury of unspecified body region, initial encounter: Secondary | ICD-10-CM | POA: Diagnosis not present

## 2019-08-19 MED ORDER — MELOXICAM 15 MG PO TABS: 15.0000 mg | ORAL_TABLET | Freq: Every day | ORAL | 0 refills | Status: DC

## 2019-08-21 ENCOUNTER — Telehealth: Payer: Self-pay | Admitting: *Deleted

## 2019-08-21 DIAGNOSIS — M7661 Achilles tendinitis, right leg: Secondary | ICD-10-CM

## 2019-08-21 DIAGNOSIS — T148XXA Other injury of unspecified body region, initial encounter: Secondary | ICD-10-CM

## 2019-08-21 NOTE — Telephone Encounter (Signed)
Orders to Dr. Wagoner's assistant for pre-cert, faxed to [facility]. 

## 2019-08-21 NOTE — Telephone Encounter (Signed)
-----   Message from Trula Slade, DPM sent at 08/19/2019 11:23 AM EST ----- Can you please order an MRI of the right ankle to rule out achilles tendon tear? Thanks.

## 2019-08-23 NOTE — Progress Notes (Signed)
Subjective:   Patient ID: Carla Huffman, female   DOB: 48 y.o.   MRN: [id number]   HPI 48 year old female presents the office today for concerns of right heel pain which is over the last 2 weeks.  She points to the back of the heel her skin for majority of tenderness.  She states that she has had issues since about 1 year ago when she was walking out of her house and her heel got caught on the door and she sustained a laceration.  She states that she did not have stitches that time but it did eventually heal but since then she had some discomfort.  However over the last 2 weeks has had worsening discomfort and when she was at work she states that she did feel a pop.  Some mild swelling but no redness.  She said it hurts all the time.  She is taking ibuprofen as needed.   Review of Systems  All other systems reviewed and are negative.  Past Medical History:  Diagnosis Date  . Anxiety   . Hypertension   . Hypothyroidism     Past Surgical History:  Procedure Laterality Date  . CESAREAN SECTION    . THYROIDECTOMY       Current Outpatient Medications:  .  albuterol (PROVENTIL HFA;VENTOLIN HFA) 108 (90 Base) MCG/ACT inhaler, Inhale 1-2 puffs into the lungs every 6 (six) hours as needed for wheezing or shortness of breath., Disp: 1 Inhaler, Rfl: 0 .  levothyroxine (SYNTHROID, LEVOTHROID) 300 MCG tablet, Take 1 tablet (300 mcg total) by mouth daily before breakfast., Disp: 30 tablet, Rfl: 3 .  meloxicam (MOBIC) 15 MG tablet, Take 1 tablet (15 mg total) by mouth daily., Disp: 30 tablet, Rfl: 0 .  Multiple Vitamin (MULTIVITAMIN WITH MINERALS) TABS tablet, Take 1 tablet by mouth daily., Disp: , Rfl:   Allergies  Allergen Reactions  . Keflex [Cephalexin] Itching        Objective:  Physical Exam  General: AAO x3, NAD  Dermatological: Scar present overlying the lesion along the posterior aspect of the right heel which is well-healed.  Vascular: Dorsalis Pedis artery and Posterior  Tibial artery pedal pulses are 2/4 bilateral with immedate capillary fill time. There is no pain with calf compression, swelling, warmth, erythema.   Neruologic: Grossly intact via light touch bilateral. Protective threshold with Semmes Wienstein monofilament intact to all pedal sites bilateral.   Musculoskeletal: There is tenderness the posterior aspect the calcaneus prominent bone spur to also the mid substance of the Achilles tendon.  I am not able to palpate any deficit.  Equinus is present.  Tendon appears to be intact.  Thompson test is negative.  Muscular strength 5/5 in all groups tested bilateral.  Gait: Unassisted, Nonantalgic.       Assessment:   RIGHT posterior heel pain, rule out partial tear of achilles tendon     Plan:  -Treatment options discussed including all alternatives, risks, and complications -Etiology of symptoms were discussed -X-rays were obtained and reviewed with the patient. Posterior calcaneal spurs present.  No evidence of acute fracture. -Given the swelling and pain is both with her history of a popping sensation will order MRI to rule out partial tear.  Clinically does not appear to be a rupture and there is minimal swelling.  For now I will place her into a cam boot with a heel lift. Prescribed mobic. Discussed side effects of the medication and directed to stop if any are to occur and call  the office.  Ice to the area.  Return for heel pain after MRI.  Vivi Barrack DPM

## 2019-08-28 ENCOUNTER — Telehealth: Payer: Self-pay | Admitting: *Deleted

## 2019-08-28 NOTE — Telephone Encounter (Signed)
Called [facility] and spoke with representative Jelly and representative stated that there was no authorization required and the Reference number is [id number]. Lattie Haw

## 2019-09-03 ENCOUNTER — Other Ambulatory Visit: Payer: Self-pay

## 2019-09-03 ENCOUNTER — Ambulatory Visit
Admission: RE | Admit: 2019-09-03 | Discharge: 2019-09-03 | Disposition: A | Discharge status: Home / Self Care | Payer: 59 | Source: Ambulatory Visit | Attending: Podiatry | Admitting: Podiatry

## 2019-09-03 DIAGNOSIS — M7661 Achilles tendinitis, right leg: Secondary | ICD-10-CM

## 2019-09-03 DIAGNOSIS — T148XXA Other injury of unspecified body region, initial encounter: Secondary | ICD-10-CM

## 2019-09-06 ENCOUNTER — Telehealth: Payer: Self-pay | Admitting: Podiatry

## 2019-09-06 DIAGNOSIS — M7731 Calcaneal spur, right foot: Secondary | ICD-10-CM

## 2019-09-06 DIAGNOSIS — M7661 Achilles tendinitis, right leg: Secondary | ICD-10-CM

## 2019-09-06 NOTE — Telephone Encounter (Signed)
Left message for pt to call for MRI results 

## 2019-09-06 NOTE — Telephone Encounter (Signed)
Val- can you pleas let her know that there was some essentially tendonitis in the achilles tendon but no tear. There is also some fluid consistent with a bursa. At this time I would start with stretching/rehab exercises or we can can refer to PT. She can transition to a regular shoe with a heel lift. Also can you prescribe a medrol dose pack for her but hold NSAIDS (including the mobic) while on this. If she wants to come in Friday to further discuss I will be happy to do so. Otherwise we can start with this and have her come in about 2 weeks.   ------------------------  Mild appearing insertional Achilles tendinopathy without tear. Mild reactive marrow edema in the calcaneus at the Achilles insertion and fluid in the retrocalcaneal bursa consistent with bursitis are noted.  Mild tibiotalar osteoarthritis.

## 2019-09-06 NOTE — Telephone Encounter (Signed)
Pt had MRI done on Saturday and is calling to see if the doctor has received her results. Pt states she was told by [place] that we would have the results on Monday.

## 2019-09-07 MED ORDER — METHYLPREDNISOLONE 4 MG PO TBPK: ORAL_TABLET | ORAL | 0 refills | Status: DC

## 2019-09-07 NOTE — Addendum Note (Signed)
Addended by: Harriett Sine D on: 09/07/2019 08:59 AM   Modules accepted: Orders

## 2019-09-07 NOTE — Telephone Encounter (Signed)
Hand delivered PT rx form to [facility].

## 2019-09-07 NOTE — Telephone Encounter (Signed)
Called patient and left a voicemail for her to call and schedule 2week appt

## 2019-09-13 ENCOUNTER — Other Ambulatory Visit: Payer: Self-pay

## 2019-09-13 DIAGNOSIS — Z20822 Contact with and (suspected) exposure to covid-19: Secondary | ICD-10-CM

## 2019-09-15 LAB — NOVEL CORONAVIRUS, NAA: SARS-CoV-2, NAA: NOT DETECTED

## 2019-09-21 ENCOUNTER — Other Ambulatory Visit: Payer: Self-pay | Admitting: Podiatry

## 2019-10-14 ENCOUNTER — Ambulatory Visit (HOSPITAL_COMMUNITY)
Admission: EM | Admit: 2019-10-14 | Discharge: 2019-10-14 | Disposition: A | Discharge status: Home / Self Care | Payer: 59 | Attending: Emergency Medicine | Admitting: Emergency Medicine

## 2019-10-14 ENCOUNTER — Encounter (HOSPITAL_COMMUNITY): Payer: Self-pay

## 2019-10-14 ENCOUNTER — Other Ambulatory Visit: Payer: Self-pay

## 2019-10-14 DIAGNOSIS — H04301 Unspecified dacryocystitis of right lacrimal passage: Secondary | ICD-10-CM | POA: Diagnosis not present

## 2019-10-14 MED ORDER — ERYTHROMYCIN 5 MG/GM OP OINT: TOPICAL_OINTMENT | OPHTHALMIC | 0 refills | Status: DC

## 2019-10-14 MED ORDER — AMOXICILLIN-POT CLAVULANATE 875-125 MG PO TABS: 1.0000 | ORAL_TABLET | Freq: Two times a day (BID) | ORAL | 0 refills | Status: AC

## 2019-10-14 NOTE — ED Triage Notes (Signed)
Pt presents with swelling and tenderness around right eye and some pain behind right ear since Thursday.

## 2019-10-14 NOTE — Discharge Instructions (Signed)
Begin Augmentin twice daily for 1 week Erythromycin ointment 4-6 times daily Warm compresses and gentle massage of tear duct Follow up if redness pain swelling worsening, developing eye pain or changes in vision

## 2019-10-15 NOTE — ED Provider Notes (Signed)
[facility]    CSN: [id number] Arrival date & time: 10/14/19  1131      History   Chief Complaint Chief Complaint  Patient presents with  . Eye Problem    HPI Carla Huffman is a 49 y.o. female history of hypertension, hypothyroidism, presenting today for evaluation of right eye swelling and discomfort.  Patient states that over the past 4 days since Thursday she has had soreness, redness and intermittent swelling around her right eye.  Symptoms have progressed since.  This morning notes increased swelling, but this has improved.  She denies any changes in vision.  Denies contact use, but does wear glasses.  She notes that her eye has been watering more than normal.  Denies any close contacts with similar symptoms.  Has some mild itching on the left side, but no significant symptoms.  She has developed some associated right ear pain.  She has tried Tylenol.  Denies associated URI symptoms of congestion cough or sore throat.  Denies fevers.  HPI  Past Medical History:  Diagnosis Date  . Anxiety   . Hypertension   . Hypothyroidism     Patient Active Problem List   Diagnosis Date Noted  . Hypothyroidism 06/30/2017  . Generalized anxiety disorder 06/30/2017    Past Surgical History:  Procedure Laterality Date  . CESAREAN SECTION    . THYROIDECTOMY      OB History   No obstetric history on file.      Home Medications    Prior to Admission medications   Medication Sig Start Date End Date Taking? Authorizing Provider  albuterol (PROVENTIL HFA;VENTOLIN HFA) 108 (90 Base) MCG/ACT inhaler Inhale 1-2 puffs into the lungs every 6 (six) hours as needed for wheezing or shortness of breath. 08/09/18   Linwood Dibbles, MD  amoxicillin-clavulanate (AUGMENTIN) 875-125 MG tablet Take 1 tablet by mouth every 12 (twelve) hours for 7 days. 10/14/19 10/21/19  Dartanian Knaggs C, PA-C  erythromycin ophthalmic ointment Place a inch ribbon of ointment into the lower eyelid 4-6 times daily  x 1 week 10/14/19   Dicy Smigel, [employer] C, PA-C  levothyroxine (SYNTHROID, LEVOTHROID) 300 MCG tablet Take 1 tablet (300 mcg total) by mouth daily before breakfast. 07/19/17   Dorena Bodo, PA-C  meloxicam (MOBIC) 15 MG tablet TAKE 1 TABLET BY MOUTH EVERY DAY 09/21/19   Vivi Barrack, DPM  methylPREDNISolone (MEDROL) 4 MG TBPK tablet Take as directed. 09/07/19   Vivi Barrack, DPM  Multiple Vitamin (MULTIVITAMIN WITH MINERALS) TABS tablet Take 1 tablet by mouth daily.    [provider]    Family History Family History  Problem Relation Age of Onset  . Breast cancer Mother 74  . Brain cancer Father 45  . Breast cancer Other 107       Half sister--same father    Social History Social History   Tobacco Use  . Smoking status: Former Smoker    Quit date: 10/06/1995    Years since quitting: 24.0  . Smokeless tobacco: Never Used  Substance Use Topics  . Alcohol use: Yes    Comment: ocassionaly  . Drug use: No     Allergies   Keflex [cephalexin] and Levofloxacin   Review of Systems Review of Systems  Constitutional: Negative for activity change, appetite change, chills, fatigue and fever.  HENT: Positive for ear pain. Negative for congestion, rhinorrhea, sinus pressure, sore throat and trouble swallowing.   Eyes: Positive for pain, discharge, redness and itching. Negative for visual  disturbance.  Respiratory: Negative for cough, chest tightness and shortness of breath.   Cardiovascular: Negative for chest pain.  Gastrointestinal: Negative for abdominal pain, diarrhea, nausea and vomiting.  Musculoskeletal: Negative for myalgias.  Skin: Negative for rash.  Neurological: Negative for dizziness, light-headedness and headaches.     Physical Exam Triage Vital Signs ED Triage Vitals  Enc Vitals Group     BP 10/14/19 1227 (!) 138/101     Pulse Rate 10/14/19 1227 82     Resp 10/14/19 1227 18     Temp 10/14/19 1227 98.4 F (36.9 C)     Temp Source 10/14/19 1227  Oral     SpO2 10/14/19 1227 98 %     Weight --      Height --      Head Circumference --      Peak Flow --      Pain Score 10/14/19 1226 5     Pain Loc --      Pain Edu? --      Excl. in GC? --    No data found.  Updated Vital Signs BP (!) 138/101 (BP Location: Right Arm)   Pulse 82   Temp 98.4 F (36.9 C) (Oral)   Resp 18   LMP 09/14/2019   SpO2 98%   Visual Acuity Right Eye Distance:  20/30 Left Eye Distance:  20/25 Bilateral Distance:  20/25  Right Eye Near:   Left Eye Near:    Bilateral Near:     Physical Exam Vitals and nursing note reviewed.  Constitutional:      Appearance: She is well-developed.     Comments: No acute distress  HENT:     Head: Normocephalic and atraumatic.     Ears:     Comments: Bilateral ears without tenderness to palpation of external auricle, tragus and mastoid, EAC's without erythema or swelling, TM's with good bony landmarks and cone of light. Non erythematous.     Nose: Nose normal.     Mouth/Throat:     Comments: Oral mucosa pink and moist, no tonsillar enlargement or exudate. Posterior pharynx patent and nonerythematous, no uvula deviation or swelling. Normal phonation. Eyes:     Extraocular Movements: Extraocular movements intact.     Conjunctiva/sclera: Conjunctivae normal.     Pupils: Pupils are equal, round, and reactive to light.     Comments: Right eye: Erythema noted along infra orbital area along the lacrimal canal, mild erythema noted to right lateral aspect of the eye, no significant swelling associated with this, tenderness to palpation around orbit, no palpable deformity Conjunctive a appears pink and similar to left, anterior chamber clear  Cardiovascular:     Rate and Rhythm: Normal rate.  Pulmonary:     Effort: Pulmonary effort is normal. No respiratory distress.  Abdominal:     General: There is no distension.  Musculoskeletal:        General: Normal range of motion.     Cervical back: Neck supple.  Skin:     General: Skin is warm and dry.  Neurological:     Mental Status: She is alert and oriented to person, place, and time.      UC Treatments / Results  Labs (all labs ordered are listed, but only abnormal results are displayed) Labs Reviewed - No data to display  EKG   Radiology No results found.  Procedures Procedures (including critical care time)  Medications Ordered in UC Medications - No data to display  Initial Impression /  Assessment and Plan / UC Course  I have reviewed the triage vital signs and the nursing notes.  Pertinent labs & imaging results that were available during my care of the patient were reviewed by me and considered in my medical decision making (see chart for details).     Given areas of erythema suggestive of possible dacryocystitis/infection of lacrimal gland.  Will treat with Augmentin as well as provide erythromycin ointment to apply topically.  Warm compresses and gentle massage tear duct.  Continue to monitor pain and swelling.  No red flags for acute eye emergency at this time.  Will provide contact for ophthalmology to follow-up if symptoms persisting/not improving with treatment provided today.  Follow-up here or emergency room if symptoms worsening or developing increased pain or changes in vision.  Discussed strict return precautions. Patient verbalized understanding and is agreeable with plan.  Final Clinical Impressions(s) / UC Diagnoses   Final diagnoses:  Dacrocystitis, right     Discharge Instructions     Begin Augmentin twice daily for 1 week Erythromycin ointment 4-6 times daily Warm compresses and gentle massage of tear duct Follow up if redness pain swelling worsening, developing eye pain or changes in vision   ED Prescriptions    Medication Sig Dispense Auth. Provider   amoxicillin-clavulanate (AUGMENTIN) 875-125 MG tablet Take 1 tablet by mouth every 12 (twelve) hours for 7 days. 14 tablet Krisalyn Yankowski C, PA-C    erythromycin ophthalmic ointment  (Status: Discontinued) Place a 1/2 inch ribbon of ointment into the lower eyelid. 3.5 g Lavell Ridings C, PA-C   erythromycin ophthalmic ointment Place a inch ribbon of ointment into the lower eyelid 4-6 times daily x 1 week 3.5 g Dennies Coate, [place] C, PA-C     PDMP not reviewed this encounter.   Lechelle Wrigley, [place] C, PA-C 10/15/19 0930

## 2020-01-18 ENCOUNTER — Encounter (HOSPITAL_COMMUNITY): Payer: Self-pay

## 2020-01-18 ENCOUNTER — Ambulatory Visit (HOSPITAL_COMMUNITY)
Admission: EM | Admit: 2020-01-18 | Discharge: 2020-01-18 | Disposition: A | Discharge status: Home / Self Care | Payer: 59 | Attending: Urgent Care | Admitting: Urgent Care

## 2020-01-18 ENCOUNTER — Other Ambulatory Visit: Payer: Self-pay

## 2020-01-18 DIAGNOSIS — M62838 Other muscle spasm: Secondary | ICD-10-CM

## 2020-01-18 DIAGNOSIS — M25521 Pain in right elbow: Secondary | ICD-10-CM | POA: Diagnosis not present

## 2020-01-18 DIAGNOSIS — M7711 Lateral epicondylitis, right elbow: Secondary | ICD-10-CM

## 2020-01-18 DIAGNOSIS — M542 Cervicalgia: Secondary | ICD-10-CM

## 2020-01-18 MED ORDER — PREDNISONE 20 MG PO TABS: ORAL_TABLET | ORAL | 0 refills | Status: DC

## 2020-01-18 MED ORDER — TIZANIDINE HCL 4 MG PO TABS: 4.0000 mg | ORAL_TABLET | Freq: Three times a day (TID) | ORAL | 0 refills | Status: DC | PRN

## 2020-01-18 NOTE — ED Triage Notes (Signed)
Pt states she has right elbow pain and pain radiating down into her her pinky and into her neck, this has been going on for a month but getting worst now.

## 2020-01-18 NOTE — ED Provider Notes (Signed)
[facility]   MRN: [id number] DOB: [birth year]  Subjective:   Carla Huffman is a 49 y.o. female presenting for 1 month hx of acute onset persistent right elbow pain. Sx have worsened and now radiate to her right pinky.  Today she developed pain along her trapezius and neck.  Has tried ibuprofen, helped in the beginning but not anymore. Patient works at [employer] works. Does repetitive motions, lifts heavy items intermittently.  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  Denies falls, trauma, redness, warmth, swelling.  No current facility-administered medications for this encounter.  Current Outpatient Medications:  .  albuterol (PROVENTIL HFA;VENTOLIN HFA) 108 (90 Base) MCG/ACT inhaler, Inhale 1-2 puffs into the lungs every 6 (six) hours as needed for wheezing or shortness of breath., Disp: 1 Inhaler, Rfl: 0 .  levothyroxine (SYNTHROID, LEVOTHROID) 300 MCG tablet, Take 1 tablet (300 mcg total) by mouth daily before breakfast., Disp: 30 tablet, Rfl: 3 .  Multiple Vitamin (MULTIVITAMIN WITH MINERALS) TABS tablet, Take 1 tablet by mouth daily., Disp: , Rfl:     Allergies  Allergen Reactions  . Keflex [Cephalexin] Itching  . Levofloxacin     nausea    Past Medical History:  Diagnosis Date  . Anxiety   . Hypertension   . Hypothyroidism      Past Surgical History:  Procedure Laterality Date  . CESAREAN SECTION    . THYROIDECTOMY      Family History  Problem Relation Age of Onset  . Breast cancer Mother 55  . Brain cancer Father 12  . Breast cancer Other 39       Half sister--same father    Social History   Tobacco Use  . Smoking status: Former Smoker    Quit date: 10/06/1995    Years since quitting: 24.3  . Smokeless tobacco: Never Used  Substance Use Topics  . Alcohol use: Yes    Comment: ocassionaly  . Drug use: No    ROS   Objective:    Vitals: BP (!) 135/91 (BP Location: Right Arm)   Pulse 82   Temp 98 F (36.7 C) (Oral)   Resp 18   Wt 300 lb (136.1 kg)   LMP 01/18/2020   SpO2 100%   BMI 41.84 kg/m   Physical Exam Constitutional:      General: She is not in acute distress.    Appearance: Normal appearance. She is well-developed. She is not ill-appearing, toxic-appearing or diaphoretic.  HENT:     Head: Normocephalic and atraumatic.     Nose: Nose normal.     Mouth/Throat:     Mouth: Mucous membranes are moist.     Pharynx: Oropharynx is clear.  Eyes:     General: No scleral icterus.    Extraocular Movements: Extraocular movements intact.     Pupils: Pupils are equal, round, and reactive to light.  Cardiovascular:     Rate and Rhythm: Normal rate.  Pulmonary:     Effort: Pulmonary effort is normal.  Musculoskeletal:     Right elbow: No swelling, deformity, effusion or lacerations. Normal range of motion. Tenderness present in lateral epicondyle (Worse over this area and elicited with resisted pronation) and olecranon process. No radial head or medial epicondyle tenderness.     Cervical back: Spasms and tenderness (Along right-sided paraspinal muscles extending into the trapezius) present. No swelling, edema, deformity, erythema, signs  of trauma, lacerations, rigidity, torticollis, bony tenderness or crepitus. Pain with movement present. Normal range of motion.  Skin:    General: Skin is warm and dry.     Findings: No rash.  Neurological:     General: No focal deficit present.     Mental Status: She is alert and oriented to person, place, and time.  Psychiatric:        Mood and Affect: Mood normal.        Behavior: Behavior normal.        Thought Content: Thought content normal.        Judgment: Judgment normal.     Assessment and Plan :   PDMP not reviewed this encounter.  1. Right elbow pain   2. Lateral epicondylitis of right elbow   3. Neck pain   4. Trapezius muscle spasm      Counseled patient on nature and management of lateral epicondylitis.  Also suspect patient is having musculoskeletal neck and trapezius muscle spasms related to overcompensating from her severe lateral epicondylitis pain.  Will use prednisone course, rest.  Use muscle relaxant for trapezius muscle spasm.  Counseled on need for orthopedic follow-up if her symptoms persist. Counseled patient on potential for adverse effects with medications prescribed/recommended today, ER and return-to-clinic precautions discussed, patient verbalized understanding.    Jaynee Eagles, PA-C 01/18/20 1555

## 2020-07-22 ENCOUNTER — Other Ambulatory Visit: Payer: 59

## 2020-07-23 ENCOUNTER — Telehealth: Payer: Self-pay | Admitting: Nurse Practitioner

## 2020-07-23 NOTE — Telephone Encounter (Signed)
Called to Discuss with patient about Covid symptoms and the use of the monoclonal antibody infusion for those with mild to moderate Covid symptoms and at a high risk of hospitalization.     Pt appears to qualify for this infusion due to co-morbid conditions and/or a member of an at-risk group in accordance with the FDA Emergency Use Authorization. BMI>25.    Unable to reach pt. Voicemail left and My Chart message sent.   Nikki Pickenpack-Cousar, NP [facility]  [phone]    

## 2021-08-26 ENCOUNTER — Other Ambulatory Visit: Payer: Self-pay

## 2021-08-26 ENCOUNTER — Ambulatory Visit (HOSPITAL_COMMUNITY)
Admission: EM | Admit: 2021-08-26 | Discharge: 2021-08-26 | Disposition: A | Discharge status: Home / Self Care | Payer: 59 | Attending: Emergency Medicine | Admitting: Emergency Medicine

## 2021-08-26 ENCOUNTER — Encounter (HOSPITAL_COMMUNITY): Payer: Self-pay

## 2021-08-26 DIAGNOSIS — M5442 Lumbago with sciatica, left side: Secondary | ICD-10-CM | POA: Insufficient documentation

## 2021-08-26 DIAGNOSIS — N390 Urinary tract infection, site not specified: Secondary | ICD-10-CM | POA: Insufficient documentation

## 2021-08-26 DIAGNOSIS — R319 Hematuria, unspecified: Secondary | ICD-10-CM | POA: Diagnosis present

## 2021-08-26 LAB — POCT URINALYSIS DIPSTICK, ED / UC
Bilirubin Urine: NEGATIVE
Glucose, UA: NEGATIVE mg/dL
Ketones, ur: NEGATIVE mg/dL
Nitrite: NEGATIVE
Protein, ur: NEGATIVE mg/dL
Specific Gravity, Urine: 1.02 (ref 1.005–1.030)
Urobilinogen, UA: 0.2 mg/dL (ref 0.0–1.0)
pH: 7 (ref 5.0–8.0)

## 2021-08-26 MED ORDER — METHYLPREDNISOLONE 4 MG PO TBPK: ORAL_TABLET | Freq: Every day | ORAL | 0 refills | Status: DC

## 2021-08-26 MED ORDER — NITROFURANTOIN MONOHYD MACRO 100 MG PO CAPS: 100.0000 mg | ORAL_CAPSULE | Freq: Two times a day (BID) | ORAL | 0 refills | Status: AC

## 2021-08-26 MED ORDER — TIZANIDINE HCL 4 MG PO TABS: 4.0000 mg | ORAL_TABLET | Freq: Three times a day (TID) | ORAL | 0 refills | Status: DC | PRN

## 2021-08-26 MED ORDER — PHENAZOPYRIDINE HCL 200 MG PO TABS: 200.0000 mg | ORAL_TABLET | Freq: Three times a day (TID) | ORAL | 0 refills | Status: DC | PRN

## 2021-08-26 MED ORDER — IBUPROFEN 600 MG PO TABS: 600.0000 mg | ORAL_TABLET | Freq: Four times a day (QID) | ORAL | 0 refills | Status: DC | PRN

## 2021-08-26 NOTE — Discharge Instructions (Addendum)
Take 600 mg of ibuprofen, 1000 mg of Tylenol together 3-4 times a day.  The steroids and muscle relaxant will help as well.  Heat, warm Epson salt soaks, stretching, deep tissue massage can be helpful.  Immediately to the ER for numbness between your thighs, if he accidentally urinate or defecate on yourself, for urinary retention, fevers, or for any other concerns  Your urine is suggestive of a urinary tract infection with moderate blood and small esterase.  I am going to send off your urine for culture to confirm the diagnosis of urinary tract infection and to make sure that we have you on the right antibiotic.  Continue pushing fluids, finish the Macrobid unless your urine culture comes back negative for UTI.  Pyridium will help with the symptoms.  If this is a stone, the ibuprofen will help you pass the stone.  Immediately to the ER for fevers above 100.4, pain not controlled with Tylenol/ibuprofen, or for any other concerns  Below is a list of primary care practices who are taking new patients for you to follow-up with.  [facility] internal medicine clinic Ground Floor - [facility], [address] [place], [place], [place] [address] [phone]  [facility] Primary Care at [facility] [address] [place], [place] [address] [phone]  [facility] [address] [place], [place] [address] [phone]  [facility] [phone] [address] [place] [address]  [facility]: [address] [place] [address]  [phone]  [facility]: [address] [place] [address]  [phone]  [facility] : [address] [place] [address] [phone]  [facility] Primary Care: [address] [place] [address] [phone]  [facility]: [address] [place] [address] [phone]  Dr. Oneal Grout [address] [facility] [place] [address]  [phone]  Go to www.goodrx.com  or www.costplusdrugs.com to look up your medications. This will give you a list of where you can find your prescriptions at the most affordable prices. Or ask the pharmacist what the cash price is, or if they have any other discount programs available to help make your medication more affordable. This can be less expensive than what you would pay with insurance.

## 2021-08-26 NOTE — ED Triage Notes (Signed)
Pt presents with back pain since yesterday.

## 2021-08-26 NOTE — ED Provider Notes (Signed)
HPI  SUBJECTIVE:  Carla Huffman is a 50 y.o. female who presents with sharp, intermittent, burning seconds long low back pain that radiates down her buttock.  She reports dysuria, cloudy urine starting several days ago.  No urinary urgency, frequency, odorous urine, hematuria.  No recent heavy lifting, change in physical activity, trauma to her back, abdominal, pelvic, flank pain.  No vaginal complaints.  No nausea, vomiting, fevers, body aches.  No saddle anesthesia, urinary or fecal incontinence, urinary retention, leg weakness, distal numbness or tingling.  She has never had symptoms like this before.  She took ibuprofen within 6 hours of evaluation.  She has tried increasing fluids and has been taking ibuprofen 400 mg once a day at night only.  No aggravating or alleviating factors.  She has a past medical history of hypertension, hypothyroidism.  No history of UTI, pyelonephritis, nephrolithiasis, history of lower back injury, cancer.  PMD: None.   Past Medical History:  Diagnosis Date   Anxiety    Hypertension    Hypothyroidism     Past Surgical History:  Procedure Laterality Date   CESAREAN SECTION     THYROIDECTOMY      Family History  Problem Relation Age of Onset   Breast cancer Mother 47   Brain cancer Father 21   Breast cancer Other 56       Half sister--same father    Social History   Tobacco Use   Smoking status: Former    Types: Cigarettes    Quit date: 10/06/1995    Years since quitting: 25.9   Smokeless tobacco: Never  Substance Use Topics   Alcohol use: Yes    Comment: ocassionaly   Drug use: No    No current facility-administered medications for this encounter.  Current Outpatient Medications:    ibuprofen (ADVIL) 600 MG tablet, Take 1 tablet (600 mg total) by mouth every 6 (six) hours as needed., Disp: 30 tablet, Rfl: 0   methylPREDNISolone (MEDROL DOSEPAK) 4 MG TBPK tablet, Take by mouth daily. Follow package instructions, Disp: 21 tablet, Rfl: 0    nitrofurantoin, macrocrystal-monohydrate, (MACROBID) 100 MG capsule, Take 1 capsule (100 mg total) by mouth 2 (two) times daily for 5 days., Disp: 10 capsule, Rfl: 0   phenazopyridine (PYRIDIUM) 200 MG tablet, Take 1 tablet (200 mg total) by mouth 3 (three) times daily as needed for pain., Disp: 6 tablet, Rfl: 0   tiZANidine (ZANAFLEX) 4 MG tablet, Take 1 tablet (4 mg total) by mouth every 8 (eight) hours as needed for muscle spasms., Disp: 30 tablet, Rfl: 0   albuterol (PROVENTIL HFA;VENTOLIN HFA) 108 (90 Base) MCG/ACT inhaler, Inhale 1-2 puffs into the lungs every 6 (six) hours as needed for wheezing or shortness of breath., Disp: 1 Inhaler, Rfl: 0   levothyroxine (SYNTHROID, LEVOTHROID) 300 MCG tablet, Take 1 tablet (300 mcg total) by mouth daily before breakfast., Disp: 30 tablet, Rfl: 3   Multiple Vitamin (MULTIVITAMIN WITH MINERALS) TABS tablet, Take 1 tablet by mouth daily., Disp: , Rfl:   Allergies  Allergen Reactions   Keflex [Cephalexin] Itching   Levofloxacin     nausea     ROS  As noted in HPI.   Physical Exam  BP 138/88 (BP Location: Right Arm)   Pulse 72   Temp 98 F (36.7 C) (Oral)   Resp 20   LMP 07/27/2021   SpO2 97%   Constitutional: Well developed, well nourished, no acute distress Eyes:  EOMI, conjunctiva normal bilaterally HENT: Normocephalic, atraumatic,mucus  membranes moist Respiratory: Normal inspiratory effort Cardiovascular: Normal rate GI: nondistended. No suprapubic, flank tenderness skin: No rash, skin intact Musculoskeletal: no CVAT. + L paralumbar tenderness, no appreciable muscle spasm. No bony tenderness. Bilateral lower extremities nontender, baseline ROM with intact  PT pulses.  Pain aggravated with left hip flexion against resistance. no pain with int/ext rotation extension hips bilaterally. SLR positive left side. Sensation baseline light touch bilaterally for Pt, DTR's symmetric and intact bilaterally KJ , Motor symmetric bilateral 5/5 hip  flexion, quadriceps, hamstrings, EHL, foot dorsiflexion, foot plantarflexion, gait normal  Neurologic: Alert & oriented x 3, no focal neuro deficits Psychiatric: Speech and behavior appropriate   ED Course   Medications - No data to display  Orders Placed This Encounter  Procedures   Urine Culture    Standing Status:   Standing    Number of Occurrences:   1    Order Specific Question:   Indication    Answer:   Dysuria   POC Urinalysis dipstick    Standing Status:   Standing    Number of Occurrences:   1    Results for orders placed or performed during the hospital encounter of 08/26/21 (from the past 24 hour(s))  POC Urinalysis dipstick     Status: Abnormal   Collection Time: 08/26/21  5:00 PM  Result Value Ref Range   Glucose, UA NEGATIVE NEGATIVE mg/dL   Bilirubin Urine NEGATIVE NEGATIVE   Ketones, ur NEGATIVE NEGATIVE mg/dL   Specific Gravity, Urine 1.020 1.005 - 1.030   Hgb urine dipstick MODERATE (A) NEGATIVE   pH 7.0 5.0 - 8.0   Protein, ur NEGATIVE NEGATIVE mg/dL   Urobilinogen, UA 0.2 0.0 - 1.0 mg/dL   Nitrite NEGATIVE NEGATIVE   Leukocytes,Ua SMALL (A) NEGATIVE   No results found.  ED Clinical Impression  1. Acute left-sided low back pain with left-sided sciatica   2. Urinary tract infection with hematuria, site unspecified     ED Assessment/Plan   No evidence of spinal cord involvement based on H&P.  No historical red flags as noted in HPI. No physical red flags such as fever, bony tenderness, lower extremity weakness, saddle anesthesia. Imaging not indicated at this time.  Patient declined pain medication here.  Checking urine.  Patient has moderate hematuria and small leukocytes. will send urine off for culture.  In the meantime, will send home with Macrobid and Pyridium.  ER return precautions given.  Presentation most consistent with musculoskeletal low back pain with sciatica.  She also appears to have a UTI.  There is no evidence of pyelonephritis  today.  Plan to send home with Tylenol/ibuprofen, Medrol Dosepak, Zanaflex.  Will provide primary care list and order assistance in finding a PMD.  Discussed labs, medical decision-making, and plan for follow-up with the patient.  Discussed signs and symptoms that should prompt return to the emergency department.  Patient agrees with plan.    Meds ordered this encounter  Medications   ibuprofen (ADVIL) 600 MG tablet    Sig: Take 1 tablet (600 mg total) by mouth every 6 (six) hours as needed.    Dispense:  30 tablet    Refill:  0   methylPREDNISolone (MEDROL DOSEPAK) 4 MG TBPK tablet    Sig: Take by mouth daily. Follow package instructions    Dispense:  21 tablet    Refill:  0   tiZANidine (ZANAFLEX) 4 MG tablet    Sig: Take 1 tablet (4 mg total) by mouth every 8 (  eight) hours as needed for muscle spasms.    Dispense:  30 tablet    Refill:  0   nitrofurantoin, macrocrystal-monohydrate, (MACROBID) 100 MG capsule    Sig: Take 1 capsule (100 mg total) by mouth 2 (two) times daily for 5 days.    Dispense:  10 capsule    Refill:  0   phenazopyridine (PYRIDIUM) 200 MG tablet    Sig: Take 1 tablet (200 mg total) by mouth 3 (three) times daily as needed for pain.    Dispense:  6 tablet    Refill:  0    *This clinic note was created using [employer]. Therefore, there may be occasional mistakes despite careful proofreading.  ?     Melynda Ripple, MD 08/27/21 [phone]

## 2021-08-28 ENCOUNTER — Observation Stay (HOSPITAL_COMMUNITY)
Admission: EM | Admit: 2021-08-28 | Discharge: 2021-08-29 | Disposition: A | Discharge status: Home / Self Care | Payer: 59 | Attending: Internal Medicine | Admitting: Internal Medicine

## 2021-08-28 ENCOUNTER — Other Ambulatory Visit: Payer: Self-pay

## 2021-08-28 ENCOUNTER — Emergency Department (HOSPITAL_COMMUNITY): Payer: 59

## 2021-08-28 ENCOUNTER — Encounter (HOSPITAL_COMMUNITY): Payer: Self-pay | Admitting: *Deleted

## 2021-08-28 DIAGNOSIS — R079 Chest pain, unspecified: Secondary | ICD-10-CM

## 2021-08-28 DIAGNOSIS — R002 Palpitations: Secondary | ICD-10-CM | POA: Diagnosis present

## 2021-08-28 DIAGNOSIS — Z20822 Contact with and (suspected) exposure to covid-19: Secondary | ICD-10-CM | POA: Diagnosis not present

## 2021-08-28 DIAGNOSIS — I10 Essential (primary) hypertension: Secondary | ICD-10-CM | POA: Diagnosis not present

## 2021-08-28 DIAGNOSIS — I471 Supraventricular tachycardia: Principal | ICD-10-CM | POA: Insufficient documentation

## 2021-08-28 DIAGNOSIS — R778 Other specified abnormalities of plasma proteins: Secondary | ICD-10-CM

## 2021-08-28 DIAGNOSIS — Z87891 Personal history of nicotine dependence: Secondary | ICD-10-CM | POA: Diagnosis not present

## 2021-08-28 DIAGNOSIS — Z79899 Other long term (current) drug therapy: Secondary | ICD-10-CM | POA: Insufficient documentation

## 2021-08-28 DIAGNOSIS — I259 Chronic ischemic heart disease, unspecified: Secondary | ICD-10-CM | POA: Diagnosis present

## 2021-08-28 DIAGNOSIS — E039 Hypothyroidism, unspecified: Secondary | ICD-10-CM | POA: Insufficient documentation

## 2021-08-28 DIAGNOSIS — R7989 Other specified abnormal findings of blood chemistry: Secondary | ICD-10-CM | POA: Diagnosis not present

## 2021-08-28 LAB — TROPONIN I (HIGH SENSITIVITY)
Troponin I (High Sensitivity): 102 ng/L (ref <18)
Troponin I (High Sensitivity): 508 ng/L (ref <18)
Troponin I (High Sensitivity): 688 ng/L (ref <18)

## 2021-08-28 LAB — BASIC METABOLIC PANEL
Anion gap: 11 (ref 5–15)
BUN: 21 mg/dL — ABNORMAL HIGH (ref 6–20)
CO2: 24 mmol/L (ref 22–32)
Calcium: 7.5 mg/dL — ABNORMAL LOW (ref 8.9–10.3)
Chloride: 105 mmol/L (ref 98–111)
Creatinine, Ser: 0.9 mg/dL (ref 0.44–1.00)
GFR, Estimated: >60 mL/min (ref >60)
Glucose, Bld: 87 mg/dL (ref 70–99)
Potassium: 3.4 mmol/L — ABNORMAL LOW (ref 3.5–5.1)
Sodium: 140 mmol/L (ref 135–145)

## 2021-08-28 LAB — COMPREHENSIVE METABOLIC PANEL
ALT: 22 U/L (ref 0–44)
AST: 40 U/L (ref 15–41)
Albumin: 3.6 g/dL (ref 3.5–5.0)
Alkaline Phosphatase: 52 U/L (ref 38–126)
Anion gap: 10 (ref 5–15)
BUN: 19 mg/dL (ref 6–20)
CO2: 24 mmol/L (ref 22–32)
Calcium: 7.4 mg/dL — ABNORMAL LOW (ref 8.9–10.3)
Chloride: 106 mmol/L (ref 98–111)
Creatinine, Ser: 0.98 mg/dL (ref 0.44–1.00)
GFR, Estimated: >60 mL/min (ref >60)
Glucose, Bld: 112 mg/dL — ABNORMAL HIGH (ref 70–99)
Potassium: 4 mmol/L (ref 3.5–5.1)
Sodium: 140 mmol/L (ref 135–145)
Total Bilirubin: 0.3 mg/dL (ref 0.3–1.2)
Total Protein: 7.2 g/dL (ref 6.5–8.1)

## 2021-08-28 LAB — CBC WITH DIFFERENTIAL/PLATELET
Abs Immature Granulocytes: 0.09 10*3/uL — ABNORMAL HIGH (ref 0.00–0.07)
Basophils Absolute: 0.1 10*3/uL (ref 0.0–0.1)
Basophils Relative: 0 %
Eosinophils Absolute: 0.1 10*3/uL (ref 0.0–0.5)
Eosinophils Relative: 0 %
HCT: 38.1 % (ref 36.0–46.0)
Hemoglobin: 11.9 g/dL — ABNORMAL LOW (ref 12.0–15.0)
Immature Granulocytes: 1 %
Lymphocytes Relative: 22 %
Lymphs Abs: 2.9 10*3/uL (ref 0.7–4.0)
MCH: 29.7 pg (ref 26.0–34.0)
MCHC: 31.2 g/dL (ref 30.0–36.0)
MCV: 95 fL (ref 80.0–100.0)
Monocytes Absolute: 0.9 10*3/uL (ref 0.1–1.0)
Monocytes Relative: 7 %
Neutro Abs: 9.4 10*3/uL — ABNORMAL HIGH (ref 1.7–7.7)
Neutrophils Relative %: 70 %
Platelets: 276 10*3/uL (ref 150–400)
RBC: 4.01 MIL/uL (ref 3.87–5.11)
RDW: 15.7 % — ABNORMAL HIGH (ref 11.5–15.5)
WBC: 13.4 10*3/uL — ABNORMAL HIGH (ref 4.0–10.5)
nRBC: 0 % (ref 0.0–0.2)

## 2021-08-28 LAB — RESP PANEL BY RT-PCR (FLU A&B, COVID) ARPGX2
Influenza A by PCR: NEGATIVE
Influenza B by PCR: NEGATIVE
SARS Coronavirus 2 by RT PCR: NEGATIVE

## 2021-08-28 LAB — HEMOGLOBIN A1C
Hgb A1c MFr Bld: 5.5 % (ref 4.8–5.6)
Mean Plasma Glucose: 111.15 mg/dL

## 2021-08-28 LAB — TSH: TSH: 4.749 u[IU]/mL — ABNORMAL HIGH (ref 0.350–4.500)

## 2021-08-28 LAB — MAGNESIUM: Magnesium: 1.9 mg/dL (ref 1.7–2.4)

## 2021-08-28 MED ORDER — METHYLPREDNISOLONE 4 MG PO TBPK: ORAL_TABLET | Freq: Every day | ORAL | Status: DC

## 2021-08-28 MED ORDER — LEVOTHYROXINE SODIUM 100 MCG PO TABS
300.0000 ug | ORAL_TABLET | Freq: Every day | ORAL | Status: DC
  Administered 2021-08-29: 300 ug via ORAL
  Filled 2021-08-28: qty 6

## 2021-08-28 MED ORDER — POTASSIUM CHLORIDE CRYS ER 20 MEQ PO TBCR
40.0000 meq | EXTENDED_RELEASE_TABLET | Freq: Once | ORAL | Status: AC
  Administered 2021-08-28: 40 meq via ORAL
  Filled 2021-08-28: qty 2

## 2021-08-28 MED ORDER — METHYLPREDNISOLONE 4 MG PO TABS: 4.0000 mg | ORAL_TABLET | Freq: Every day | ORAL | Status: DC

## 2021-08-28 MED ORDER — TIZANIDINE HCL 4 MG PO TABS: 4.0000 mg | ORAL_TABLET | Freq: Three times a day (TID) | ORAL | Status: DC | PRN

## 2021-08-28 MED ORDER — METOPROLOL TARTRATE 12.5 MG HALF TABLET
12.5000 mg | ORAL_TABLET | Freq: Two times a day (BID) | ORAL | Status: DC
  Administered 2021-08-29 (×2): 12.5 mg via ORAL
  Filled 2021-08-28 (×2): qty 1

## 2021-08-28 MED ORDER — METHYLPREDNISOLONE 4 MG PO TABS: 4.0000 mg | ORAL_TABLET | Freq: Two times a day (BID) | ORAL | Status: DC

## 2021-08-28 MED ORDER — METOPROLOL TARTRATE 5 MG/5ML IV SOLN
5.0000 mg | Freq: Once | INTRAVENOUS | Status: AC
Start: 2021-08-28 — End: 2021-08-28
  Administered 2021-08-28: 5 mg via INTRAVENOUS
  Filled 2021-08-28: qty 5

## 2021-08-28 MED ORDER — ONDANSETRON HCL 4 MG/2ML IJ SOLN: 4.0000 mg | Freq: Four times a day (QID) | INTRAMUSCULAR | Status: DC | PRN

## 2021-08-28 MED ORDER — ADULT MULTIVITAMIN W/MINERALS CH
1.0000 | ORAL_TABLET | Freq: Every day | ORAL | Status: DC
  Administered 2021-08-29: 1 via ORAL
  Filled 2021-08-28: qty 1

## 2021-08-28 MED ORDER — ACETAMINOPHEN 325 MG PO TABS: 650.0000 mg | ORAL_TABLET | ORAL | Status: DC | PRN

## 2021-08-28 MED ORDER — PHENAZOPYRIDINE HCL 200 MG PO TABS
200.0000 mg | ORAL_TABLET | Freq: Three times a day (TID) | ORAL | Status: DC | PRN
  Administered 2021-08-28: 200 mg via ORAL
  Filled 2021-08-28 (×2): qty 1

## 2021-08-28 MED ORDER — ALBUTEROL SULFATE (2.5 MG/3ML) 0.083% IN NEBU: 2.5000 mg | INHALATION_SOLUTION | Freq: Four times a day (QID) | RESPIRATORY_TRACT | Status: DC | PRN

## 2021-08-28 MED ORDER — NITROFURANTOIN MONOHYD MACRO 100 MG PO CAPS
100.0000 mg | ORAL_CAPSULE | Freq: Two times a day (BID) | ORAL | Status: DC
  Administered 2021-08-28 – 2021-08-29 (×2): 100 mg via ORAL
  Filled 2021-08-28 (×3): qty 1

## 2021-08-28 MED ORDER — ENOXAPARIN SODIUM 80 MG/0.8ML IJ SOSY
70.0000 mg | PREFILLED_SYRINGE | Freq: Every day | INTRAMUSCULAR | Status: DC
  Administered 2021-08-28: 70 mg via SUBCUTANEOUS
  Filled 2021-08-28: qty 0.8

## 2021-08-28 MED ORDER — METHYLPREDNISOLONE 4 MG PO TABS
4.0000 mg | ORAL_TABLET | Freq: Three times a day (TID) | ORAL | Status: DC
  Administered 2021-08-28 – 2021-08-29 (×2): 4 mg via ORAL
  Filled 2021-08-28 (×4): qty 1

## 2021-08-28 MED ORDER — ALPRAZOLAM 0.5 MG PO TABS: 0.2500 mg | ORAL_TABLET | Freq: Two times a day (BID) | ORAL | Status: DC | PRN

## 2021-08-28 NOTE — Significant Event (Signed)
Rapid Response Event Note   Reason for Call :  HR-200s  Initial Focused Assessment:  Pt lying in bed in no distress. She is alert and oriented, c/o 7/10 mid sternal sharp chest pain and mild SOB. Lungs clear t/o. Skin warm and dry.  T-98, HR-204, BP-128/96, RR-18, SpO2-100% and RA.   Interventions:  EKG-SVT, ST abnormality 5mg  metoprolol IV x 1   Plan of Care:  Pt HR converted to 70s after metoprolol administration, BP-109/64. Additional EKG shows NSR. Pt has no more chest pain. Continue to monitor pt closely. Call RRT if further assistance needed.   Event Summary:   MD Notified: Dr. Call [phone] Arrival [phone] End Time:2220  [id number], RN

## 2021-08-28 NOTE — ED Triage Notes (Signed)
Pt with mid cp since around 1400 today.  Pt believed it to be anxiety which she has hx of but pain continued.  + SOB, denies any N/V.

## 2021-08-28 NOTE — H&P (Signed)
Cardiology Admission History and Physical:   Patient ID: Carla Huffman MRN: [id number]; DOB: 1970/12/21   Admission date: 08/28/2021  PCP:  Pcp, No   [facility] Providers Cardiologist:  None        Chief Complaint:  heart palpitations  Patient Profile:   Carla Huffman is a 50 y.o. female with a past medical history of thyroid nodule status post surgery now hypothyroidism and anxiety who was transferred from [facility], ED for evaluation of heart palpitations.  History of Present Illness:   Carla Huffman reports heart palpitations at times when she has anxiety attack.  Patient started frequent episodes of heart palpitations associated with mid chest sharp pain, shortness of breath and mild lightheaded today.  She feels very anxious.  Her chest pain does not radiate and is triggered by her heart palpitations every time and resolves when her heart palpitations resolves. Patient had another ED visit on 08/26/21 due to UTI and is on steroid. Denies fever, chills, dizziness, syncope,  cough, nausea, vomiting, heartburn unrelated to meals, abdominal fullness, dysuria, diarrhea, pedal edema or any bleeding events.  Patient does not have a PCP. Denies hx of smoking, drinking or illicit drug use. Reports her father had a heart attack in his 57s.  OSH: K 3.4, Cr 0.9, WBC 13.4, Hgb 11.9, troponin 102->508, TSH 4.74,  EKG@ 28-Aug-2021 15:09:11 sinus rhythm, PVC, STD horizontal anterolateral leads ECG @24 -Nov-2022 20:10:33 sinus rhythm  Past Medical History:  Diagnosis Date   Anxiety    Hypertension    Hypothyroidism     Past Surgical History:  Procedure Laterality Date   CESAREAN SECTION     THYROIDECTOMY       Medications Prior to Admission: Prior to Admission medications   Medication Sig Start Date End Date Taking? Authorizing Provider  albuterol (PROVENTIL HFA;VENTOLIN HFA) 108 (90 Base) MCG/ACT inhaler Inhale 1-2 puffs into the lungs every 6 (six) hours as needed for  wheezing or shortness of breath. 08/09/18   13/5/19, MD  ibuprofen (ADVIL) 600 MG tablet Take 1 tablet (600 mg total) by mouth every 6 (six) hours as needed. 08/26/21   08/28/21, MD  levothyroxine (SYNTHROID, LEVOTHROID) 300 MCG tablet Take 1 tablet (300 mcg total) by mouth daily before breakfast. 07/19/17   07/21/17, PA-C  methylPREDNISolone (MEDROL DOSEPAK) 4 MG TBPK tablet Take by mouth daily. Follow package instructions 08/26/21   08/28/21, MD  Multiple Vitamin (MULTIVITAMIN WITH MINERALS) TABS tablet Take 1 tablet by mouth daily.    [provider]  nitrofurantoin, macrocrystal-monohydrate, (MACROBID) 100 MG capsule Take 1 capsule (100 mg total) by mouth 2 (two) times daily for 5 days. 08/26/21 08/31/21  09/02/21, MD  phenazopyridine (PYRIDIUM) 200 MG tablet Take 1 tablet (200 mg total) by mouth 3 (three) times daily as needed for pain. 08/26/21   08/28/21, MD  tiZANidine (ZANAFLEX) 4 MG tablet Take 1 tablet (4 mg total) by mouth every 8 (eight) hours as needed for muscle spasms. 08/26/21   08/28/21, MD     Allergies:    Allergies  Allergen Reactions   Keflex [Cephalexin] Itching   Levofloxacin     nausea    Social History:   Social History   Socioeconomic History   Marital status: Married    Spouse name: Not on file   Number of children: Not on file   Years of education: Not on file   Highest education level: Not on file  Occupational History  Not on file  Tobacco Use   Smoking status: Former    Types: Cigarettes    Quit date: 10/06/1995    Years since quitting: 25.9   Smokeless tobacco: Never  Substance and Sexual Activity   Alcohol use: Yes    Comment: ocassionaly   Drug use: No   Sexual activity: Not on file  Other Topics Concern   Not on file  Social History Narrative   Not on file   Social Determinants of Health   Financial Resource Strain: Not on file  Food Insecurity: Not on file  Transportation  Needs: Not on file  Physical Activity: Not on file  Stress: Not on file  Social Connections: Not on file  Intimate Partner Violence: Not on file    Family History:   The patient's family history includes Brain cancer (age of onset: 91) in her father; Breast cancer (age of onset: 54) in an other family member; Breast cancer (age of onset: 79) in her mother.    ROS:  Please see the history of present illness.  All other ROS reviewed and negative.     Physical Exam/Data:   Vitals:   08/28/21 2150 08/28/21 2200 08/28/21 2205 08/28/21 2249  BP: (!) 128/96 114/68 118/81 (!) 111/57  Pulse: (!) 216 (!) 204 (!) 211 78  Resp: 20 12 12 16   Temp:    98.1 F (36.7 C)  TempSrc:    Oral  SpO2: 100% 100% 100% 98%  Weight:      Height:       No intake or output data in the 24 hours ending 08/28/21 2309 Last 3 Weights 08/28/2021 08/28/2021 01/18/2020  Weight (lbs) 304 lb 14.3 oz 310 lb 300 lb  Weight (kg) 138.3 kg 140.615 kg 136.079 kg     Body mass index is 42.52 kg/m.  General:  Well nourished, well developed, in no acute distress, obese HEENT: normal Neck: no JVD Vascular: No carotid bruits; Distal pulses 2+ bilaterally   Cardiac:  normal S1, S2; RRR; no murmur  Lungs:  clear to auscultation bilaterally, no wheezing, rhonchi or rales  Abd: soft, nontender, no hepatomegaly  Ext: no edema Musculoskeletal:  No deformities, BUE and BLE strength normal and equal Skin: warm and dry  Neuro:  CNs 2-12 intact, no focal abnormalities noted Psych:  Normal affect   EKG:  The ECG that was done  was personally reviewed and demonstrates sinus rhythm  Relevant CV Studies:  Laboratory Data:  High Sensitivity Troponin:   Recent Labs  Lab 08/28/21 1550 08/28/21 1751  TROPONINIHS 102* 508*      Chemistry Recent Labs  Lab 08/28/21 1550  NA 140  K 3.4*  CL 105  CO2 24  GLUCOSE 87  BUN 21*  CREATININE 0.90  CALCIUM 7.5*  MG 1.9  GFRNONAA >60  ANIONGAP 11    No results for  input(s): PROT, ALBUMIN, AST, ALT, ALKPHOS, BILITOT in the last 168 hours. Lipids No results for input(s): CHOL, TRIG, HDL, LABVLDL, LDLCALC, CHOLHDL in the last 168 hours. Hematology Recent Labs  Lab 08/28/21 1550  WBC 13.4*  RBC 4.01  HGB 11.9*  HCT 38.1  MCV 95.0  MCH 29.7  MCHC 31.2  RDW 15.7*  PLT 276   Thyroid  Recent Labs  Lab 08/28/21 1550  TSH 4.749*   BNPNo results for input(s): BNP, PROBNP in the last 168 hours.  DDimer No results for input(s): DDIMER in the last 168 hours.   Radiology/Studies:  08/30/21  Chest Port 1 View  Result Date: 08/28/2021 CLINICAL DATA:  Acute chest pain today. EXAM: PORTABLE CHEST 1 VIEW COMPARISON:  08/09/2018 chest radiograph FINDINGS: UPPER limits normal heart size and mild peribronchial thickening again noted. RIGHT hemidiaphragm elevation is unchanged. There is no evidence of focal airspace disease, pulmonary edema, suspicious pulmonary nodule/mass, pleural effusion, or pneumothorax. No acute bony abnormalities are identified. Surgical clips within the neck are again noted IMPRESSION: No evidence of acute cardiopulmonary disease. Electronically Signed   By: Harmon Pier M.D.   On: 08/28/2021 16:47     Assessment and Plan:   #Supraventricular tachycardia -Rhythm showed short RP tachycardia -Admitted with SVT to 200s.  Converted with IV metoprolol 5mg .   -start metoprolol   #Chest pain  #Elevated troponin -atypical chest pain, EKGs at OSH demonstrated STD dynamic changes -continue to trend troponin -EKG prn -acquire a TTE am -Her elevated troponins are possible 2/2 tachycardia. However, given patient's CAD risk factors, dynamic EKG changes and continued rising troponins, will start heparin gtt x48h.  Risk Assessment/Risk Scores:    HEAR Score (for undifferentiated chest pain):  HEAR Score: 3{  Severity of Illness: The appropriate patient status for this patient is OBSERVATION. Observation status is judged to be reasonable and  necessary in order to provide the required intensity of service to ensure the patient's safety. The patient's presenting symptoms, physical exam findings, and initial radiographic and laboratory data in the context of their medical condition is felt to place them at decreased risk for further clinical deterioration. Furthermore, it is anticipated that the patient will be medically stable for discharge from the hospital within 2 midnights of admission.    For questions or updates, please contact [facility] Please consult [email or web address] for contact info under     Signed, , MD  08/28/2021 11:09 PM

## 2021-08-28 NOTE — ED Provider Notes (Signed)
[facility] EMERGENCY DEPARTMENT Provider Note   CSN: [id number] Arrival date & time: 08/28/21  1502     History Chief Complaint  Patient presents with   Chest Pain    Carla Huffman is a 50 y.o. female she is here with a complaining of acute onset of tachycardia shortness of breath and chest pain that started about an hour prior to arrival.  She had an apple watch and said her heart rate was around 210.  This resolved just prior to arrival.  She had her heart rate went up once before with anxiety.  She feels back to baseline now.  She said she was treated with an antibiotic for UTI recently and has a pinched nerve in her back.  Otherwise no other acute symptoms.  The history is provided by the patient.  Chest Pain Pain location:  Substernal area Pain quality: pressure   Pain radiates to:  Does not radiate Pain severity:  Moderate Onset quality:  Sudden Duration:  1 hour Timing:  Constant Progression:  Resolved Chronicity:  Recurrent Context: at rest   Relieved by:  None tried Worsened by:  Nothing Ineffective treatments:  None tried Associated symptoms: palpitations and shortness of breath   Associated symptoms: no abdominal pain, no cough, no diaphoresis, no fever, no headache and no vomiting   Risk factors: hypertension       Past Medical History:  Diagnosis Date   Anxiety    Hypertension    Hypothyroidism     Patient Active Problem List   Diagnosis Date Noted   Hypothyroidism 06/30/2017   Generalized anxiety disorder 06/30/2017    Past Surgical History:  Procedure Laterality Date   CESAREAN SECTION     THYROIDECTOMY       OB History   No obstetric history on file.     Family History  Problem Relation Age of Onset   Breast cancer Mother 74   Brain cancer Father 35   Breast cancer Other 81       Half sister--same father    Social History   Tobacco Use   Smoking status: Former    Types: Cigarettes    Quit date: 10/06/1995    Years since  quitting: 25.9   Smokeless tobacco: Never  Substance Use Topics   Alcohol use: Yes    Comment: ocassionaly   Drug use: No    Home Medications Prior to Admission medications   Medication Sig Start Date End Date Taking? Authorizing Provider  albuterol (PROVENTIL HFA;VENTOLIN HFA) 108 (90 Base) MCG/ACT inhaler Inhale 1-2 puffs into the lungs every 6 (six) hours as needed for wheezing or shortness of breath. 08/09/18   Linwood Dibbles, MD  ibuprofen (ADVIL) 600 MG tablet Take 1 tablet (600 mg total) by mouth every 6 (six) hours as needed. 08/26/21   Domenick Gong, MD  levothyroxine (SYNTHROID, LEVOTHROID) 300 MCG tablet Take 1 tablet (300 mcg total) by mouth daily before breakfast. 07/19/17   Dorena Bodo, PA-C  methylPREDNISolone (MEDROL DOSEPAK) 4 MG TBPK tablet Take by mouth daily. Follow package instructions 08/26/21   Domenick Gong, MD  Multiple Vitamin (MULTIVITAMIN WITH MINERALS) TABS tablet Take 1 tablet by mouth daily.    [provider]  nitrofurantoin, macrocrystal-monohydrate, (MACROBID) 100 MG capsule Take 1 capsule (100 mg total) by mouth 2 (two) times daily for 5 days. 08/26/21 08/31/21  Domenick Gong, MD  phenazopyridine (PYRIDIUM) 200 MG tablet Take 1 tablet (200 mg total) by mouth 3 (three) times daily as  needed for pain. 08/26/21   Melynda Ripple, MD  tiZANidine (ZANAFLEX) 4 MG tablet Take 1 tablet (4 mg total) by mouth every 8 (eight) hours as needed for muscle spasms. 08/26/21   Melynda Ripple, MD    Allergies    Keflex [cephalexin] and Levofloxacin  Review of Systems   Review of Systems  Constitutional:  Negative for diaphoresis and fever.  HENT:  Negative for sore throat.   Eyes:  Negative for visual disturbance.  Respiratory:  Positive for shortness of breath. Negative for cough.   Cardiovascular:  Positive for chest pain and palpitations.  Gastrointestinal:  Negative for abdominal pain and vomiting.  Genitourinary:  Negative for hematuria.   Musculoskeletal:  Negative for neck pain.  Skin:  Negative for rash.  Neurological:  Negative for headaches.   Physical Exam Updated Vital Signs BP 129/64   Pulse 84 Comment: RA  Resp 14 Comment: RA  Ht 5\' 11"  (1.803 m)   Wt (!) 140.6 kg   LMP 07/27/2021   SpO2 91% Comment: RA  BMI 43.24 kg/m   Physical Exam Vitals and nursing note reviewed.  Constitutional:      General: She is not in acute distress.    Appearance: She is well-developed.  HENT:     Head: Normocephalic and atraumatic.  Eyes:     Conjunctiva/sclera: Conjunctivae normal.  Cardiovascular:     Rate and Rhythm: Normal rate and regular rhythm.     Heart sounds: Normal heart sounds. No murmur heard. Pulmonary:     Effort: Pulmonary effort is normal. No respiratory distress.     Breath sounds: Normal breath sounds.  Abdominal:     Palpations: Abdomen is soft.     Tenderness: There is no abdominal tenderness.  Musculoskeletal:        General: No swelling. Normal range of motion.     Cervical back: Neck supple.     Right lower leg: No tenderness. No edema.     Left lower leg: No tenderness. No edema.  Skin:    General: Skin is warm and dry.     Capillary Refill: Capillary refill takes less than 2 seconds.  Neurological:     Mental Status: She is alert.  Psychiatric:        Mood and Affect: Mood normal.    ED Results / Procedures / Treatments   Labs (all labs ordered are listed, but only abnormal results are displayed) Labs Reviewed  BASIC METABOLIC PANEL - Abnormal; Notable for the following components:      Result Value   Potassium 3.4 (*)    BUN 21 (*)    Calcium 7.5 (*)    All other components within normal limits  CBC WITH DIFFERENTIAL/PLATELET - Abnormal; Notable for the following components:   WBC 13.4 (*)    Hemoglobin 11.9 (*)    RDW 15.7 (*)    Neutro Abs 9.4 (*)    Abs Immature Granulocytes 0.09 (*)    All other components within normal limits  TSH - Abnormal; Notable for the  following components:   TSH 4.749 (*)    All other components within normal limits  T4, FREE - Abnormal; Notable for the following components:   Free T4 1.19 (*)    All other components within normal limits  COMPREHENSIVE METABOLIC PANEL - Abnormal; Notable for the following components:   Glucose, Bld 112 (*)    Calcium 7.4 (*)    All other components within normal limits  HEPARIN LEVEL (  UNFRACTIONATED) - Abnormal; Notable for the following components:   Heparin Unfractionated 0.81 (*)    All other components within normal limits  TROPONIN I (HIGH SENSITIVITY) - Abnormal; Notable for the following components:   Troponin I (High Sensitivity) 102 (*)    All other components within normal limits  TROPONIN I (HIGH SENSITIVITY) - Abnormal; Notable for the following components:   Troponin I (High Sensitivity) 508 (*)    All other components within normal limits  TROPONIN I (HIGH SENSITIVITY) - Abnormal; Notable for the following components:   Troponin I (High Sensitivity) 688 (*)    All other components within normal limits  TROPONIN I (HIGH SENSITIVITY) - Abnormal; Notable for the following components:   Troponin I (High Sensitivity) 682 (*)    All other components within normal limits  RESP PANEL BY RT-PCR (FLU A&B, COVID) ARPGX2  MRSA NEXT GEN BY PCR, NASAL  MAGNESIUM  HIV ANTIBODY (ROUTINE TESTING W REFLEX)  HEMOGLOBIN A1C    EKG EKG Interpretation  Date/Time:  Thursday August 28 2021 15:09:11 EST Ventricular Rate:  85 PR Interval:  155 QRS Duration: 90 QT Interval:  329 QTC Calculation: 392 R Axis:   15 Text Interpretation: Sinus rhythm Multiple ventricular premature complexes Repol abnrm, severe global ischemia (LM/MVD) Confirmed by Aletta Edouard [phone]) on 08/28/2021 3:18:47 PM  Radiology DG Chest Port 1 View  Result Date: 08/28/2021 CLINICAL DATA:  Acute chest pain today. EXAM: PORTABLE CHEST 1 VIEW COMPARISON:  08/09/2018 chest radiograph FINDINGS: UPPER limits  normal heart size and mild peribronchial thickening again noted. RIGHT hemidiaphragm elevation is unchanged. There is no evidence of focal airspace disease, pulmonary edema, suspicious pulmonary nodule/mass, pleural effusion, or pneumothorax. No acute bony abnormalities are identified. Surgical clips within the neck are again noted IMPRESSION: No evidence of acute cardiopulmonary disease. Electronically Signed   By: Margarette Canada M.D.   On: 08/28/2021 16:47    Procedures Procedures   Medications Ordered in ED Medications  nitrofurantoin (macrocrystal-monohydrate) (MACROBID) capsule 100 mg (100 mg Oral Given 08/28/21 2359)  levothyroxine (SYNTHROID) tablet 300 mcg (300 mcg Oral Given 08/29/21 0552)  phenazopyridine (PYRIDIUM) tablet 200 mg (200 mg Oral Given 08/28/21 2359)  tiZANidine (ZANAFLEX) tablet 4 mg (has no administration in time range)  multivitamin with minerals tablet 1 tablet (has no administration in time range)  albuterol (PROVENTIL) (2.5 MG/3ML) 0.083% nebulizer solution 2.5 mg (has no administration in time range)  acetaminophen (TYLENOL) tablet 650 mg (has no administration in time range)  ondansetron (ZOFRAN) injection 4 mg (has no administration in time range)  metoprolol tartrate (LOPRESSOR) tablet 12.5 mg (12.5 mg Oral Given 08/29/21 0010)  ALPRAZolam (XANAX) tablet 0.25 mg (has no administration in time range)  methylPREDNISolone (MEDROL) tablet 4 mg (4 mg Oral Given 08/28/21 2359)    Followed by  methylPREDNISolone (MEDROL) tablet 4 mg (has no administration in time range)    Followed by  methylPREDNISolone (MEDROL) tablet 4 mg (has no administration in time range)  heparin ADULT infusion 100 units/mL (25000 units/252mL) (1,700 Units/hr Intravenous New Bag/Given 08/29/21 0302)  LORazepam (ATIVAN) tablet 0.5 mg (has no administration in time range)  potassium chloride SA (KLOR-CON) CR tablet 40 mEq (40 mEq Oral Given 08/28/21 1958)  metoprolol tartrate (LOPRESSOR)  injection 5 mg (5 mg Intravenous Given 08/28/21 2203)    ED Course  I have reviewed the triage vital signs and the nursing notes.  Pertinent labs & imaging results that were available during my care of the patient were  reviewed by me and considered in my medical decision making (see chart for details).  Clinical Course as of 08/29/21 1004  Thu Aug 28, 2021  1644 Chest x-ray does not show any acute infiltrates.  Awaiting radiology reading. [MB]  1843 Troponins are rising.  Patient states her chest pain is much improved she just feels little soreness now.  She appears very comfortable cardiology consult placed [MB]  1923 Discussed with Dr. Lovena [facility] cardiology who recommends admission to [facility] for further evaluation.  He does not recommend starting her on heparin. [MB]    Clinical Course User Index [MB] Hayden Rasmussen, MD   MDM Rules/Calculators/A&P                          This patient complains of chest pain and tachycardia; this involves an extensive number of treatment Options and is a complaint that carries with it a high risk of complications and Morbidity. The differential includes arrhythmia, ischemia anxiety palpitations, pneumonia, pneumothorax  I ordered, reviewed and interpreted labs, which included CBC with mildly elevated white count, hemoglobin slightly lower than priors, chemistry with mildly low potassium elevated and rising, COVID and flu negative, TSH pending I ordered medication oral potassium I ordered imaging studies which included chest x-ray and I independently    visualized and interpreted imaging which showed no acute findings Previous records obtained and reviewed in epic, was here recently for UTI symptoms I consulted Dr. Lovena Le cardiology and discussed lab and imaging findings  Critical Interventions: None  After the interventions stated above, I reevaluated the patient and found patient be currently asymptomatic.  She has had no further  arrhythmias on cardiac monitor.  However her troponins are rising and she possibly had a tachycardic event earlier.  Cardiology is recommending admission to their service down to [facility] for further evaluation.  Patient agreeable to plan.   Final Clinical Impression(s) / ED Diagnoses Final diagnoses:  Chest pain, unspecified type  Elevated troponin    Rx / DC Orders ED Discharge Orders     None        Hayden Rasmussen, MD 08/29/21 1009

## 2021-08-29 ENCOUNTER — Other Ambulatory Visit: Payer: Self-pay | Admitting: Physician Assistant

## 2021-08-29 ENCOUNTER — Observation Stay (HOSPITAL_COMMUNITY): Payer: 59

## 2021-08-29 ENCOUNTER — Other Ambulatory Visit (HOSPITAL_COMMUNITY): Payer: Self-pay

## 2021-08-29 ENCOUNTER — Observation Stay (HOSPITAL_BASED_OUTPATIENT_CLINIC_OR_DEPARTMENT_OTHER): Payer: 59

## 2021-08-29 DIAGNOSIS — R079 Chest pain, unspecified: Secondary | ICD-10-CM

## 2021-08-29 DIAGNOSIS — I259 Chronic ischemic heart disease, unspecified: Secondary | ICD-10-CM | POA: Diagnosis not present

## 2021-08-29 DIAGNOSIS — I471 Supraventricular tachycardia: Secondary | ICD-10-CM

## 2021-08-29 DIAGNOSIS — R778 Other specified abnormalities of plasma proteins: Secondary | ICD-10-CM | POA: Diagnosis not present

## 2021-08-29 LAB — ECHOCARDIOGRAM COMPLETE
Area-P 1/2: 3.6 cm2
Height: 71 in
S' Lateral: 4 cm
Single Plane A2C EF: 45.1 %
Weight: 4878.34 oz

## 2021-08-29 LAB — URINE CULTURE: Culture: >=100000 — AB

## 2021-08-29 LAB — TROPONIN I (HIGH SENSITIVITY): Troponin I (High Sensitivity): 682 ng/L (ref <18)

## 2021-08-29 LAB — MRSA NEXT GEN BY PCR, NASAL: MRSA by PCR Next Gen: NOT DETECTED

## 2021-08-29 LAB — T4, FREE: Free T4: 1.19 ng/dL — ABNORMAL HIGH (ref 0.61–1.12)

## 2021-08-29 LAB — HEPARIN LEVEL (UNFRACTIONATED): Heparin Unfractionated: 0.81 IU/mL — ABNORMAL HIGH (ref 0.30–0.70)

## 2021-08-29 LAB — HIV ANTIBODY (ROUTINE TESTING W REFLEX): HIV Screen 4th Generation wRfx: NONREACTIVE

## 2021-08-29 MED ORDER — METOPROLOL TARTRATE 50 MG PO TABS
50.0000 mg | ORAL_TABLET | Freq: Once | ORAL | Status: AC
  Administered 2021-08-29: 50 mg via ORAL
  Filled 2021-08-29: qty 1

## 2021-08-29 MED ORDER — HEPARIN (PORCINE) 25000 UT/250ML-% IV SOLN
1700.0000 [IU]/h | INTRAVENOUS | Status: DC
  Administered 2021-08-29: 1700 [IU]/h via INTRAVENOUS
  Filled 2021-08-29: qty 250

## 2021-08-29 MED ORDER — NITROGLYCERIN 0.4 MG SL SUBL
SUBLINGUAL_TABLET | SUBLINGUAL | Status: AC
  Administered 2021-08-29: 0.8 mg
  Filled 2021-08-29: qty 2

## 2021-08-29 MED ORDER — METOPROLOL TARTRATE 25 MG PO TABS
25.0000 mg | ORAL_TABLET | Freq: Two times a day (BID) | ORAL | 3 refills | Status: DC
  Filled 2021-08-29: qty 60, 30d supply, fill #0

## 2021-08-29 MED ORDER — ENOXAPARIN SODIUM 150 MG/ML IJ SOSY
140.0000 mg | PREFILLED_SYRINGE | Freq: Two times a day (BID) | INTRAMUSCULAR | Status: DC
  Administered 2021-08-29: 140 mg via SUBCUTANEOUS
  Filled 2021-08-29 (×2): qty 0.94

## 2021-08-29 MED ORDER — LORAZEPAM 0.5 MG PO TABS
0.5000 mg | ORAL_TABLET | Freq: Once | ORAL | Status: AC
  Administered 2021-08-29: 0.5 mg via ORAL
  Filled 2021-08-29: qty 1

## 2021-08-29 MED ORDER — IOHEXOL 350 MG/ML SOLN
100.0000 mL | Freq: Once | INTRAVENOUS | Status: AC | PRN
  Administered 2021-08-29: 100 mL via INTRAVENOUS

## 2021-08-29 NOTE — Progress Notes (Signed)
  Echocardiogram 2D Echocardiogram has been performed.  Delcie Roch 08/29/2021, 11:14 AM

## 2021-08-29 NOTE — Progress Notes (Addendum)
ANTICOAGULATION CONSULT NOTE - Initial Consult  Pharmacy Consult for Heparin>>Lovenox Indication: chest pain/ACS  Allergies  Allergen Reactions   Keflex [Cephalexin] Itching   Levofloxacin     nausea    Patient Measurements: Height: 5\' 11"  (180.3 cm) Weight: (!) 138.3 kg (304 lb 14.3 oz) IBW/kg (Calculated) : 70.8 Heparin Dosing Weight: 103 kg  Vital Signs: Temp: 97.9 F (36.6 C) (11/25 0738) Temp Source: Oral (11/25 0738) BP: 112/74 (11/25 0738) Pulse Rate: 76 (11/25 0738)  Labs: Recent Labs    08/28/21 1550 08/28/21 1751 08/28/21 2258 08/29/21 0110 08/29/21 0912  HGB 11.9*  --   --   --   --   HCT 38.1  --   --   --   --   PLT 276  --   --   --   --   HEPARINUNFRC  --   --   --   --  0.81*  CREATININE 0.90  --  0.98  --   --   TROPONINIHS 102* 508* 688* 682*  --      Estimated Creatinine Clearance: 106 mL/min (by C-G formula based on SCr of 0.98 mg/dL).   Medical History: Past Medical History:  Diagnosis Date   Anxiety    Hypertension    Hypothyroidism     Medications:  Medications Prior to Admission  Medication Sig Dispense Refill Last Dose   ibuprofen (ADVIL) 600 MG tablet Take 1 tablet (600 mg total) by mouth every 6 (six) hours as needed. 30 tablet 0 Past Week   levothyroxine (SYNTHROID, LEVOTHROID) 300 MCG tablet Take 1 tablet (300 mcg total) by mouth daily before breakfast. 30 tablet 3 Past Week   loratadine-pseudoephedrine (CLARITIN-D 24 HOUR) 10-240 MG 24 hr tablet Take 1 tablet by mouth daily.   Past Month   methylPREDNISolone (MEDROL DOSEPAK) 4 MG TBPK tablet Take by mouth daily. Follow package instructions 21 tablet 0 Past Week   Multiple Vitamin (MULTIVITAMIN WITH MINERALS) TABS tablet Take 1 tablet by mouth daily.   Past Week   nitrofurantoin, macrocrystal-monohydrate, (MACROBID) 100 MG capsule Take 1 capsule (100 mg total) by mouth 2 (two) times daily for 5 days. 10 capsule 0 Past Week   phenazopyridine (PYRIDIUM) 200 MG tablet Take 1  tablet (200 mg total) by mouth 3 (three) times daily as needed for pain. 6 tablet 0 unk   tiZANidine (ZANAFLEX) 4 MG tablet Take 1 tablet (4 mg total) by mouth every 8 (eight) hours as needed for muscle spasms. 30 tablet 0 Past Week   albuterol (PROVENTIL HFA;VENTOLIN HFA) 108 (90 Base) MCG/ACT inhaler Inhale 1-2 puffs into the lungs every 6 (six) hours as needed for wheezing or shortness of breath. (Patient not taking: Reported on 08/29/2021) 1 Inhaler 0 Not Taking    Assessment: 50 y.o. F presents with CP. To begin heparin for ACS. Pt received Lovenox 70mg  (0.5mg /kg) for VTE prophylaxis 11/24 2359. No AC PTA. CBC ok on admission. Noted plan for heparin x 48 hours per cardiology note.  Heparin level came back supratherapeutic at 0.81. Probably still residual effect from dose of lovenox. Plan for cardiac CT. Ok to transition to Lovenox in the meantime instead per Dr. .   Goal of Therapy:  Anti-Xa 0.6-1.2 Monitor platelets by anticoagulation protocol: Yes   Plan:  Dc heparin Lovenox 140mg  SQ BID F/u stopping lovenox  12/24, PharmD, BCIDP, AAHIVP, CPP Infectious Disease Pharmacist 08/29/2021 10:13 AM

## 2021-08-29 NOTE — Progress Notes (Signed)
Progress Note  Patient Name: Carla Huffman Date of Encounter: 08/29/2021  [facility] Cardiologist: None   Subjective   Feeling well.  Reports struggling with episodes of "anxiety" lasting approximately 10 minutes since her 78s  Inpatient Medications    Scheduled Meds:  levothyroxine  300 mcg Oral QAC breakfast   LORazepam  0.5 mg Oral Once   methylPREDNISolone  4 mg Oral TID   Followed by   Melene Muller ON 08/30/2021] methylPREDNISolone  4 mg Oral BID WC   Followed by   Melene Muller ON 08/31/2021] methylPREDNISolone  4 mg Oral Daily   metoprolol tartrate  12.5 mg Oral BID   multivitamin with minerals  1 tablet Oral Daily   nitrofurantoin (macrocrystal-monohydrate)  100 mg Oral BID   Continuous Infusions:  heparin 1,700 Units/hr (08/29/21 0302)   PRN Meds: acetaminophen, albuterol, ALPRAZolam, ondansetron (ZOFRAN) IV, phenazopyridine, tiZANidine   Vital Signs    Vitals:   08/28/21 2205 08/28/21 2249 08/29/21 0349 08/29/21 0738  BP: 118/81 (!) 111/57 (!) 106/51 112/74  Pulse: (!) 211 78 72 76  Resp: 12 16 17 15   Temp:  98.1 F (36.7 C) 98 F (36.7 C) 97.9 F (36.6 C)  TempSrc:  Oral Oral Oral  SpO2: 100% 98% 93% 93%  Weight:      Height:        Intake/Output Summary (Last 24 hours) at 08/29/2021 0958 Last data filed at 08/29/2021 0738 Gross per 24 hour  Intake 480 ml  Output --  Net 480 ml   Last 3 Weights 08/28/2021 08/28/2021 01/18/2020  Weight (lbs) 304 lb 14.3 oz 310 lb 300 lb  Weight (kg) 138.3 kg 140.615 kg 136.079 kg      Telemetry    SVT 160-200s.  Now sinus rhythm - Personally Reviewed  ECG    SVT 203.  Short RP tachycardia.  Marked, diffuse ST depression  - Personally Reviewed  Physical Exam   VS:  BP 112/74 (BP Location: Right Arm)   Pulse 76   Temp 97.9 F (36.6 C) (Oral)   Resp 15   Ht 5\' 11"  (1.803 m)   Wt (!) 138.3 kg   LMP 07/27/2021   SpO2 93%   BMI 42.52 kg/m  , BMI Body mass index is 42.52 kg/m. GENERAL:  Well  appearing HEENT: Pupils equal round and reactive, fundi not visualized, oral mucosa unremarkable NECK:  No jugular venous distention, waveform within normal limits, carotid upstroke brisk and symmetric, no bruits, no thyromegaly LUNGS:  Clear to auscultation bilaterally HEART:  RRR.  PMI not displaced or sustained,S1 and S2 within normal limits, no S3, no S4, no clicks, no rubs, no murmurs ABD:  Flat, positive bowel sounds normal in frequency in pitch, no bruits, no rebound, no guarding, no midline pulsatile mass, no hepatomegaly, no splenomegaly EXT:  2 plus pulses throughout, no edema, no cyanosis no clubbing SKIN:  No rashes no nodules NEURO:  Cranial nerves II through XII grossly intact, motor grossly intact throughout PSYCH:  Cognitively intact, oriented to person place and time   Labs    High Sensitivity Troponin:   Recent Labs  Lab 08/28/21 1550 08/28/21 1751 08/28/21 2258 08/29/21 0110  TROPONINIHS 102* 508* 688* 682*     Chemistry Recent Labs  Lab 08/28/21 1550 08/28/21 2258  NA 140 140  K 3.4* 4.0  CL 105 106  CO2 24 24  GLUCOSE 87 112*  BUN 21* 19  CREATININE 0.90 0.98  CALCIUM 7.5* 7.4*  MG 1.9  --  PROT  --  7.2  ALBUMIN  --  3.6  AST  --  40  ALT  --  22  ALKPHOS  --  52  BILITOT  --  0.3  GFRNONAA >60 >60  ANIONGAP 11 10    Lipids No results for input(s): CHOL, TRIG, HDL, LABVLDL, LDLCALC, CHOLHDL in the last 168 hours.  Hematology Recent Labs  Lab 08/28/21 1550  WBC 13.4*  RBC 4.01  HGB 11.9*  HCT 38.1  MCV 95.0  MCH 29.7  MCHC 31.2  RDW 15.7*  PLT 276   Thyroid  Recent Labs  Lab 08/28/21 1550 08/28/21 2250  TSH 4.749*  --   FREET4  --  1.19*    BNPNo results for input(s): BNP, PROBNP in the last 168 hours.  DDimer No results for input(s): DDIMER in the last 168 hours.   Radiology    DG Chest Port 1 View  Result Date: 08/28/2021 CLINICAL DATA:  Acute chest pain today. EXAM: PORTABLE CHEST 1 VIEW COMPARISON:  08/09/2018  chest radiograph FINDINGS: UPPER limits normal heart size and mild peribronchial thickening again noted. RIGHT hemidiaphragm elevation is unchanged. There is no evidence of focal airspace disease, pulmonary edema, suspicious pulmonary nodule/mass, pleural effusion, or pneumothorax. No acute bony abnormalities are identified. Surgical clips within the neck are again noted IMPRESSION: No evidence of acute cardiopulmonary disease. Electronically Signed   By: Margarette Canada M.D.   On: 08/28/2021 16:47    Cardiac Studies   Echo pending  Coronary CT-A pending  Patient Profile     50 y.o. female with hypothyroidism admitted with SVT and elevated cardiac enzymes.  Assessment & Plan    # SVT:  Short RP tachycardia.  She reports feeling with "anxiety" since her 62s.  Unclear whether she truly has anxiety or is having SVT.  She was started on metoprolol.  We discussed her diagnosis and possible treatment options.  Continue metoprolol for now plan to discharge with a monitor.  #NSTEMI: # Chest pain:  High-sensitivity troponin was elevated to 688 and she had marked ST depressions diffusely on her initial EKG.  However she was in SVT in the 200s at the time.  She does not otherwise have chest pain with exertion.  We will get a coronary CTA to assess.  Okay to continue heparin for now.  For questions or updates, please contact [place] Please consult [email or web address] for contact info under        Signed, Skeet Latch, MD  08/29/2021, 9:58 AM

## 2021-08-29 NOTE — Discharge Summary (Signed)
Discharge Summary    Patient ID: Carla Huffman MRN: [id number]; DOB: 23-Apr-1971  Admit date: 08/28/2021 Discharge date: 08/29/2021  PCP:  Chilton Si, MD   [facility] Providers Cardiologist:  Chilton Si, MD        Discharge Diagnoses    Principal Problem:   Cardiac ischemia Active Problems:   Supraventricular tachycardia [facility])    Diagnostic Studies/Procedures    ECHO: 08/29/2021  1. Inferior basal hypokinesis . Left ventricular ejection fraction, by  estimation, is 50 to 55%. The left ventricle has low normal function. The left ventricle demonstrates regional wall motion abnormalities (see scoring diagram/findings for description). Left ventricular diastolic parameters were normal.   2. Right ventricular systolic function is mildly reduced. The right  ventricular size is mildly enlarged. There is normal pulmonary artery  systolic pressure.   3. Left atrial size was mildly dilated.   4. Right atrial size was mildly dilated.   5. The mitral valve is normal in structure. No evidence of mitral valve regurgitation. No evidence of mitral stenosis.   6. The aortic valve is normal in structure. Aortic valve regurgitation is not visualized. No aortic stenosis is present.   7. Aortic dilatation noted. There is mild dilatation of the ascending  aorta, measuring 41 mm.   8. The inferior vena cava is normal in size with greater than 50%  respiratory variability, suggesting right atrial pressure of 3 mmHg.   CARDIAC CTA: 08/29/2021 Aortic Valve:  Trileaflet.  No calcifications.   Coronary Arteries:  Normal coronary origin.  Right dominance.   RCA is a large dominant artery that gives rise to PDA and PLA. There is no plaque.   Left main is a large artery that gives rise to LAD and LCX arteries.   LAD is a large vessel that has no plaque.   LCX is a non-dominant artery that gives rise to one large OM1 branch. There is no plaque.   Coronary Calcium Score:    Left main: 0   Left anterior descending artery: 0   Left circumflex artery: 0   Right coronary artery: 0   Total: 0   Percentile: 0   Other findings:   Normal pulmonary vein drainage into the left atrium.   Normal left atrial appendage without a thrombus.   Normal size of the pulmonary artery.   IMPRESSION: 1. Coronary calcium score of 0. This was 0 percentile for age and sex matched control.   2. Normal coronary origin with right dominance.   3. No evidence of CAD. CAD-RADS 0. No evidence of CAD (0%). Consider non-atherosclerotic causes of chest pain.   4. Proximal ascending aorta dilatation.  RADIOLOGY FINDINGS: FINDINGS: Vascular: Fusiform ectasia of the ascending aorta measuring up to 42 mm. No additional significant extra cardiac vascular abnormality.   Mediastinum/Nodes: No enlarged mediastinal or axillary lymph nodes. The trachea and esophagus demonstrate no significant findings.   Lungs/Pleura: No focal consolidation, pleural effusion, or pneumothorax.   Upper Abdomen: No acute abnormality.   Musculoskeletal: No chest wall mass or suspicious bone lesions identified.   IMPRESSION: Fusiform aneurysmal dilation of the visualized ascending thoracic aorta measuring up to 4.2 cm. Recommend annual imaging followup by CTA or MRA. This recommendation follows 2010 ACCF/AHA/AATS/ACR/ASA/SCA/SCAI/SIR/STS/SVM Guidelines for the Diagnosis and Management of Patients with Thoracic Aortic Disease.   _____________   History of Present Illness     Carla Huffman is a 50 y.o. female with anxiety, HTN, and hypothyroidism was admitted 11/24 with SVT and elevated cardiac  enzymes.  Hospital Course     Consultants: None   The SVT resolved spontaneously. She was started on metoprolol. She was kept on the monitor overnight and had no recurrence. Her VS were stable on low-dose metoprolol. She will wear a monitor as an outpatient.  Her chest pain resolved once the SVT  resolved. His enzymes were elevated, peak 688. Echo showed EF 50-55%, WMA is listed.  With elevated troponin, chest pain and abnl echo>> ischemic eval was indicated. She had a cardiac CTA.   The results are above, there is no CAD. 42 mm ascending aorta should be followed yearly.   Dr Oval Linsey reviewed all data. No further inpatient workup is indicated and she is considered stable for d/c , to follow up as an outpatient.   Did the patient have an acute coronary syndrome (MI, NSTEMI, STEMI, etc) this admission?:  No.   The elevated Troponin was due to the acute medical illness (demand ischemia).     _____________  Discharge Vitals Blood pressure 107/77, pulse 68, temperature 97.9 F (36.6 C), temperature source Oral, resp. rate 16, height 5\' 11"  (1.803 m), weight (!) 138.3 kg, last menstrual period 07/27/2021, SpO2 95 %.  Filed Weights   08/28/21 1511 08/28/21 2108  Weight: (!) 140.6 kg (!) 138.3 kg    Labs & Radiologic Studies    CBC Recent Labs    08/28/21 1550  WBC 13.4*  NEUTROABS 9.4*  HGB 11.9*  HCT 38.1  MCV 95.0  PLT [id number]   Basic Metabolic Panel Recent Labs    08/28/21 1550 08/28/21 2258  NA 140 140  K 3.4* 4.0  CL 105 106  CO2 24 24  GLUCOSE 87 112*  BUN 21* 19  CREATININE 0.90 0.98  CALCIUM 7.5* 7.4*  MG 1.9  --    Liver Function Tests Recent Labs    08/28/21 2258  AST 40  ALT 22  ALKPHOS 52  BILITOT 0.3  PROT 7.2  ALBUMIN 3.6   No results for input(s): LIPASE, AMYLASE in the last 72 hours. High Sensitivity Troponin:   Recent Labs  Lab 08/28/21 1550 08/28/21 1751 08/28/21 2258 08/29/21 0110  TROPONINIHS 102* 508* 688* 682*    BNP Invalid input(s): POCBNP D-Dimer No results for input(s): DDIMER in the last 72 hours. Hemoglobin A1C Recent Labs    08/28/21 2250  HGBA1C 5.5   Fasting Lipid Panel No results for input(s): CHOL, HDL, LDLCALC, TRIG, CHOLHDL, LDLDIRECT in the last 72 hours. Thyroid Function Tests Recent Labs     08/28/21 1550  TSH 4.749*   _____________  CT CORONARY MORPH W/CTA COR W/SCORE W/CA W/CM &/OR WO/CM  Addendum Date: 08/29/2021   ADDENDUM REPORT: 08/29/2021 14:23 CLINICAL DATA:  This is a 50 year old female with elevated troponin. EXAM: Cardiac/Coronary  CTA TECHNIQUE: The patient was scanned on a [employer]. FINDINGS: A 100 kV prospective scan was triggered in the descending thoracic aorta at 111 HU's. Axial non-contrast 3 mm slices were carried out through the heart. The data set was analyzed on a dedicated work station and scored using the [place]. Gantry rotation speed was 250 msecs and collimation was .6 mm. No beta blockade and 0.8 mg of sl NTG was given. The 3D data set was reconstructed in 5% intervals of the 67-82 % of the R-R cycle. Diastolic phases were analyzed on a dedicated work station using MPR, MIP and VRT modes. The patient received 80 cc of contrast. Image quality: Contrast opacification not  optimal but the is good visibility of the coronary arteries. Evidence of respiratory motion artifact. Aorta: The proximal ascending aorta is dilated (41.4 mm). No calcifications. No dissection. Aortic Valve:  Trileaflet.  No calcifications. Coronary Arteries:  Normal coronary origin.  Right dominance. RCA is a large dominant artery that gives rise to PDA and PLA. There is no plaque. Left main is a large artery that gives rise to LAD and LCX arteries. LAD is a large vessel that has no plaque. LCX is a non-dominant artery that gives rise to one large OM1 branch. There is no plaque. Coronary Calcium Score: Left main: 0 Left anterior descending artery: 0 Left circumflex artery: 0 Right coronary artery: 0 Total: 0 Percentile: 0 Other findings: Normal pulmonary vein drainage into the left atrium. Normal left atrial appendage without a thrombus. Normal size of the pulmonary artery. IMPRESSION: 1. Coronary calcium score of 0. This was 0 percentile for age and sex matched control. 2. Normal  coronary origin with right dominance. 3. No evidence of CAD. CAD-RADS 0. No evidence of CAD (0%). Consider non-atherosclerotic causes of chest pain. 4. Proximal ascending aorta dilatation. Electronically Signed   By: Berniece Salines D.O.   On: 08/29/2021 14:23   Result Date: 08/29/2021 EXAM: OVER-READ INTERPRETATION  CT CHEST The following report is an over-read performed by radiologist Dr. Ruthann Cancer of [facility], [place] on 08/29/2021. This over-read does not include interpretation of cardiac or coronary anatomy or pathology. The coronary CTA interpretation by the cardiologist is attached. COMPARISON:  None. FINDINGS: Vascular: Fusiform ectasia of the ascending aorta measuring up to 42 mm. No additional significant extra cardiac vascular abnormality. Mediastinum/Nodes: No enlarged mediastinal or axillary lymph nodes. The trachea and esophagus demonstrate no significant findings. Lungs/Pleura: No focal consolidation, pleural effusion, or pneumothorax. Upper Abdomen: No acute abnormality. Musculoskeletal: No chest wall mass or suspicious bone lesions identified. IMPRESSION: Fusiform aneurysmal dilation of the visualized ascending thoracic aorta measuring up to 4.2 cm. Recommend annual imaging followup by CTA or MRA. This recommendation follows 2010 ACCF/AHA/AATS/ACR/ASA/SCA/SCAI/SIR/STS/SVM Guidelines for the Diagnosis and Management of Patients with Thoracic Aortic Disease. Circulation. 2010; [id number]. Aortic aneurysm NOS (ICD10-I71.9) Ruthann Cancer, MD [facility] Electronically Signed: By: Ruthann Cancer M.D. On: 08/29/2021 14:11   DG Chest Port 1 View  Result Date: 08/28/2021 CLINICAL DATA:  Acute chest pain today. EXAM: PORTABLE CHEST 1 VIEW COMPARISON:  08/09/2018 chest radiograph FINDINGS: UPPER limits normal heart size and mild peribronchial thickening again noted. RIGHT hemidiaphragm elevation is unchanged. There is no evidence of  focal airspace disease, pulmonary edema, suspicious pulmonary nodule/mass, pleural effusion, or pneumothorax. No acute bony abnormalities are identified. Surgical clips within the neck are again noted IMPRESSION: No evidence of acute cardiopulmonary disease. Electronically Signed   By: Margarette Canada M.D.   On: 08/28/2021 16:47   ECHOCARDIOGRAM COMPLETE  Result Date: 08/29/2021    ECHOCARDIOGRAM REPORT   Patient Name:   Carla Huffman Date of Exam: 08/29/2021 Medical Rec #:  [id number]        Height:       71.0 in Accession #:    [id number]       Weight:       304.9 lb Date of Birth:  October 04, 1971         BSA:          2.522 m Patient Age:    50 years         BP:  122/85 mmHg Patient Gender: F                HR:           71 bpm. Exam Location:  Inpatient Procedure: 2D Echo Indications:    elevated troponin  History:        Patient has no prior history of Echocardiogram examinations.                 Arrythmias:SVT.  Sonographer:    Delcie Roch RDCS Referring Phys: [id number] FAN YE IMPRESSIONS  1. Inferior basal hypokinesis . Left ventricular ejection fraction, by estimation, is 50 to 55%. The left ventricle has low normal function. The left ventricle demonstrates regional wall motion abnormalities (see scoring diagram/findings for description). Left ventricular diastolic parameters were normal.  2. Right ventricular systolic function is mildly reduced. The right ventricular size is mildly enlarged. There is normal pulmonary artery systolic pressure.  3. Left atrial size was mildly dilated.  4. Right atrial size was mildly dilated.  5. The mitral valve is normal in structure. No evidence of mitral valve regurgitation. No evidence of mitral stenosis.  6. The aortic valve is normal in structure. Aortic valve regurgitation is not visualized. No aortic stenosis is present.  7. Aortic dilatation noted. There is mild dilatation of the ascending aorta, measuring 41 mm.  8. The inferior vena cava is normal in  size with greater than 50% respiratory variability, suggesting right atrial pressure of 3 mmHg. FINDINGS  Left Ventricle: Inferior basal hypokinesis. Left ventricular ejection fraction, by estimation, is 50 to 55%. The left ventricle has low normal function. The left ventricle demonstrates regional wall motion abnormalities. The left ventricular internal cavity size was normal in size. There is no left ventricular hypertrophy. Left ventricular diastolic parameters were normal. Right Ventricle: The right ventricular size is mildly enlarged. Right vetricular wall thickness was not assessed. Right ventricular systolic function is mildly reduced. There is normal pulmonary artery systolic pressure. The tricuspid regurgitant velocity is 2.28 m/s, and with an assumed right atrial pressure of 3 mmHg, the estimated right ventricular systolic pressure is 23.8 mmHg. Left Atrium: Left atrial size was mildly dilated. Right Atrium: Right atrial size was mildly dilated. Pericardium: There is no evidence of pericardial effusion. Mitral Valve: The mitral valve is normal in structure. No evidence of mitral valve regurgitation. No evidence of mitral valve stenosis. Tricuspid Valve: The tricuspid valve is normal in structure. Tricuspid valve regurgitation is mild . No evidence of tricuspid stenosis. Aortic Valve: The aortic valve is normal in structure. Aortic valve regurgitation is not visualized. No aortic stenosis is present. Pulmonic Valve: The pulmonic valve was normal in structure. Pulmonic valve regurgitation is not visualized. No evidence of pulmonic stenosis. Aorta: The aortic root is normal in size and structure and aortic dilatation noted. There is mild dilatation of the ascending aorta, measuring 41 mm. Venous: The inferior vena cava is normal in size with greater than 50% respiratory variability, suggesting right atrial pressure of 3 mmHg. IAS/Shunts: No atrial level shunt detected by color flow Doppler.  LEFT VENTRICLE  PLAX 2D LVIDd:         5.10 cm      Diastology LVIDs:         4.00 cm      LV e' medial:    5.22 cm/s LV PW:         1.20 cm      LV E/e' medial:  11.1 LV IVS:  1.10 cm      LV e' lateral:   6.42 cm/s LVOT diam:     2.20 cm      LV E/e' lateral: 9.0 LV SV:         66 LV SV Index:   26 LVOT Area:     3.80 cm                              3D Volume EF: LV Volumes (MOD)            3D EF:        48 % LV vol d, MOD A2C: 108.0 ml LV EDV:       149 ml LV vol s, MOD A2C: 59.3 ml  LV ESV:       78 ml LV SV MOD A2C:     48.7 ml  LV SV:        71 ml RIGHT VENTRICLE             IVC RV S prime:     10.00 cm/s  IVC diam: 2.10 cm TAPSE (M-mode): 1.6 cm LEFT ATRIUM             Index        RIGHT ATRIUM           Index LA diam:        4.00 cm 1.59 cm/m   RA Area:     14.60 cm LA Vol (A2C):   68.5 ml 27.16 ml/m  RA Volume:   33.70 ml  13.36 ml/m LA Vol (A4C):   44.1 ml 17.48 ml/m LA Biplane Vol: 58.2 ml 23.07 ml/m  AORTIC VALVE LVOT Vmax:   86.50 cm/s LVOT Vmean:  56.600 cm/s LVOT VTI:    0.174 m  AORTA Ao Root diam: 3.80 cm Ao Asc diam:  4.10 cm MITRAL VALVE               TRICUSPID VALVE MV Area (PHT): 3.60 cm    TR Peak grad:   20.8 mmHg MV Decel Time: 211 msec    TR Vmax:        228.00 cm/s MV E velocity: 57.80 cm/s MV A velocity: 56.60 cm/s  SHUNTS MV E/A ratio:  1.02        Systemic VTI:  0.17 m                            Systemic Diam: 2.20 cm Jenkins Rouge MD Electronically signed by Jenkins Rouge MD Signature Date/Time: 08/29/2021/11:22:56 AM    Final    Disposition   Pt is being discharged home today in good condition.  Follow-up Plans & Appointments     Follow-up Information     Skeet Latch, MD Follow up.   Specialty: Cardiology Why: The office will call. Contact information: [place] [address] [phone]                Discharge Instructions     Diet - low sodium heart healthy   Complete by: As directed    Increase activity slowly   Complete by: As  directed        Discharge Medications   Allergies as of 08/29/2021       Reactions   Keflex [cephalexin] Itching   Levofloxacin    nausea        Medication  List     TAKE these medications    albuterol 108 (90 Base) MCG/ACT inhaler Commonly known as: VENTOLIN HFA Inhale 1-2 puffs into the lungs every 6 (six) hours as needed for wheezing or shortness of breath.   Claritin-D 24 Hour 10-240 MG 24 hr tablet Generic drug: loratadine-pseudoephedrine Take 1 tablet by mouth daily.   ibuprofen 600 MG tablet Commonly known as: ADVIL Take 1 tablet (600 mg total) by mouth every 6 (six) hours as needed.   levothyroxine 300 MCG tablet Commonly known as: SYNTHROID Take 1 tablet (300 mcg total) by mouth daily before breakfast.   methylPREDNISolone 4 MG Tbpk tablet Commonly known as: MEDROL DOSEPAK Take by mouth daily. Follow package instructions   metoprolol tartrate 25 MG tablet Commonly known as: LOPRESSOR Take 1 tablet (25 mg total) by mouth 2 (two) times daily.   multivitamin with minerals Tabs tablet Take 1 tablet by mouth daily.   nitrofurantoin (macrocrystal-monohydrate) 100 MG capsule Commonly known as: Macrobid Take 1 capsule (100 mg total) by mouth 2 (two) times daily for 5 days.   phenazopyridine 200 MG tablet Commonly known as: PYRIDIUM Take 1 tablet (200 mg total) by mouth 3 (three) times daily as needed for pain.   tiZANidine 4 MG tablet Commonly known as: ZANAFLEX Take 1 tablet (4 mg total) by mouth every 8 (eight) hours as needed for muscle spasms.           Outstanding Labs/Studies   None  Duration of Discharge Encounter   Greater than 30 minutes including physician time.  Signed, Rosaria Ferries, PA-C 08/29/2021, 3:56 PM

## 2021-08-29 NOTE — Progress Notes (Signed)
ANTICOAGULATION CONSULT NOTE - Initial Consult  Pharmacy Consult for Heparin Indication: chest pain/ACS  Allergies  Allergen Reactions   Keflex [Cephalexin] Itching   Levofloxacin     nausea    Patient Measurements: Height: 5\' 11"  (180.3 cm) Weight: (!) 138.3 kg (304 lb 14.3 oz) IBW/kg (Calculated) : 70.8 Heparin Dosing Weight: 103 kg  Vital Signs: Temp: 98.1 F (36.7 C) (11/24 2249) Temp Source: Oral (11/24 2249) BP: 111/57 (11/24 2249) Pulse Rate: 78 (11/24 2249)  Labs: Recent Labs    08/28/21 1550 08/28/21 1751 08/28/21 2258  HGB 11.9*  --   --   HCT 38.1  --   --   PLT 276  --   --   CREATININE 0.90  --  0.98  TROPONINIHS 102* 508* 688*    Estimated Creatinine Clearance: 106 mL/min (by C-G formula based on SCr of 0.98 mg/dL).   Medical History: Past Medical History:  Diagnosis Date   Anxiety    Hypertension    Hypothyroidism     Medications:  Medications Prior to Admission  Medication Sig Dispense Refill Last Dose   albuterol (PROVENTIL HFA;VENTOLIN HFA) 108 (90 Base) MCG/ACT inhaler Inhale 1-2 puffs into the lungs every 6 (six) hours as needed for wheezing or shortness of breath. 1 Inhaler 0    ibuprofen (ADVIL) 600 MG tablet Take 1 tablet (600 mg total) by mouth every 6 (six) hours as needed. 30 tablet 0    levothyroxine (SYNTHROID, LEVOTHROID) 300 MCG tablet Take 1 tablet (300 mcg total) by mouth daily before breakfast. 30 tablet 3    methylPREDNISolone (MEDROL DOSEPAK) 4 MG TBPK tablet Take by mouth daily. Follow package instructions 21 tablet 0    Multiple Vitamin (MULTIVITAMIN WITH MINERALS) TABS tablet Take 1 tablet by mouth daily.      nitrofurantoin, macrocrystal-monohydrate, (MACROBID) 100 MG capsule Take 1 capsule (100 mg total) by mouth 2 (two) times daily for 5 days. 10 capsule 0    phenazopyridine (PYRIDIUM) 200 MG tablet Take 1 tablet (200 mg total) by mouth 3 (three) times daily as needed for pain. 6 tablet 0    tiZANidine (ZANAFLEX) 4 MG  tablet Take 1 tablet (4 mg total) by mouth every 8 (eight) hours as needed for muscle spasms. 30 tablet 0     Assessment: 50 y.o. F presents with CP. To begin heparin for ACS. Pt received Lovenox 70mg  (0.5mg /kg) for VTE prophylaxis 11/24 2359. No AC PTA. CBC ok on admission. Noted plan for heparin x 48 hours per cardiology note.  Goal of Therapy:  Heparin level 0.3-0.7 units/ml Monitor platelets by anticoagulation protocol: Yes   Plan:  No bolus with recent Lovenox given Heparin gtt at 1700 units/hr Will f/u heparin level in 6 hours Daily heparin level and CBC  , PharmD, BCPS Please see amion for complete clinical pharmacist phone list 08/29/2021,1:46 AM

## 2021-08-29 NOTE — Plan of Care (Signed)
  Problem: Education: Goal: Knowledge of General Education information will improve Description: Including pain rating scale, medication(s)/side effects and non-pharmacologic comfort measures Outcome: Progressing   Problem: Health Behavior/Discharge Planning: Goal: Ability to manage health-related needs will improve Outcome: Progressing   Problem: Clinical Measurements: Goal: Ability to maintain clinical measurements within normal limits will improve Outcome: Progressing Goal: Will remain free from infection Outcome: Progressing Goal: Diagnostic test results will improve Outcome: Progressing Goal: Respiratory complications will improve Outcome: Progressing Goal: Cardiovascular complication will be avoided Outcome: Progressing   Problem: Activity: Goal: Risk for activity intolerance will decrease Outcome: Progressing   Problem: Nutrition: Goal: Adequate nutrition will be maintained Outcome: Progressing   Problem: Coping: Goal: Level of anxiety will decrease Outcome: Progressing   Problem: Elimination: Goal: Will not experience complications related to bowel motility Outcome: Progressing Goal: Will not experience complications related to urinary retention Outcome: Progressing   Problem: Pain Managment: Goal: General experience of comfort will improve Outcome: Progressing   Problem: Safety: Goal: Ability to remain free from injury will improve Outcome: Progressing   Problem: Skin Integrity: Goal: Risk for impaired skin integrity will decrease Outcome: Progressing   Problem: Education: Goal: Knowledge of disease or condition will improve Outcome: Progressing Goal: Understanding of medication regimen will improve Outcome: Progressing Goal: Individualized Educational Video(s) Outcome: Progressing   Problem: Cardiac: Goal: Ability to achieve and maintain adequate cardiopulmonary perfusion will improve Outcome: Progressing   Problem: Health Behavior/Discharge  Planning: Goal: Ability to safely manage health-related needs after discharge will improve Outcome: Progressing   

## 2021-09-01 ENCOUNTER — Ambulatory Visit (INDEPENDENT_AMBULATORY_CARE_PROVIDER_SITE_OTHER): Payer: 59

## 2021-09-01 DIAGNOSIS — I471 Supraventricular tachycardia: Secondary | ICD-10-CM

## 2021-09-01 NOTE — Progress Notes (Unsigned)
Enrolled for [employer] to mail a ZIO AT Live Telemetry monitor to patients address on file.  

## 2021-09-04 DIAGNOSIS — I471 Supraventricular tachycardia: Secondary | ICD-10-CM | POA: Diagnosis not present

## 2021-09-24 NOTE — Progress Notes (Signed)
Office Visit    Patient Name: Carla Huffman Huffman of Encounter: 09/25/2021  PCP:  Aviva Kluver   [facility]  Cardiologist:  Chilton Si, MD  Advanced Practice Provider:  No care team member to display Electrophysiologist:  None    Chief Complaint    Carla Huffman is a 50 y.o. female with a hx of anxiety, hypertension, and hypothyroidism presents today for hospital follow-up.  Past Medical History    Past Medical History:  Diagnosis Huffman   Anxiety    Hypertension    Hypothyroidism    Past Surgical History:  Procedure Laterality Huffman   CESAREAN SECTION     THYROIDECTOMY      Allergies  Allergies  Allergen Reactions   Keflex [Cephalexin] Itching   Levofloxacin     nausea    History of Present Illness    Carla Huffman is a 50 y.o. female with a hx of anxiety, hypertension, and hypothyroidism presents today for hospital follow-up last seen in the hospital by Theodore Demark, PA-C.  She was admitted 1124 with SVT and elevated cardiac enzymes.  The SVT resolved spontaneously.  She was started on metoprolol.  She is kept on the monitor overnight and had no recurrence.  Her vital signs were stable on low-dose metoprolol.  She was discharged with a monitor.  Her chest pain resolved once the SVT resolved.  The enzymes were elevated, peak 688.  Echocardiogram showed EF 50 to 55%, WMA as listed.  With elevated troponin, chest pain and abnormal echo ischemic evaluation is indicated.  She had a cardiac CTA.  No CAD found.  42 mm ascending aorta should be followed yearly.  Dr. Duke Salvia reviewed all data and no further inpatient work-up was indicated at this time.  She was stable for discharge with outpatient follow-up.  Today, she present to review her monitor results. She turned her monitor in 12/15. Full report below. She did have two episode of Ventricular tachycardia, 12 runs of SVT, min HR 46 bpm, and max 179 bpm, average 75 bpm. She is still  having episodes where she describes feeling a skipped heart beat and she looses her breath. She has had one episode last week where her vision narrowed but she did not pass out. She states it resolved quickly with deep breathing. She mentioned she has not had any episodes of syncope. Her palpitations occurred 10 times during the 14 days she wore the monitor. Started on metoprolol 25mg  BID without much improvement. She does not have any BP room to increase this dose at this time. We discussed limiting caffeine. She is currently drinking coffee in the morning and usually drinks 2-3  glasses of tea plus soda. She also has hypothyroidism and her last TSH was slightly elevated. We suggested establishing a PCP to routinely check her thyroid and titrate her mediations. We ordered a TSH today. She also is diagnosed with anxiety and took herself off her xanax since she did not think it was working anymore. She is on CBD oil and GABA. This may also be contributing to her palpitations. We discussed stopping any decongestants with pseudoephedrine. When she was in the hospital her chest was hurting and her heart was racing. She has not had that feeling since then. We have sent a referral to EP today and discussed lifestyle changes that may be contributing to her palpitations.    Reports no chest pain, pressure, or tightness. No edema, orthopnea, PND.      EKGs/Labs/Other  Studies Reviewed:   The following studies were reviewed today:  ZIO monitor results  Patch Wear Time:  13 days and 17 hours (2022-12-01T12:06:54-0500 to 2022-12-15T05:08:20-0500)   Patient had a min HR of 46 bpm, max HR of 179 bpm, and avg HR of 75 bpm. Predominant underlying rhythm was Sinus Rhythm. 2 Ventricular Tachycardia runs occurred, the run with the fastest interval lasting 7 beats with a max rate of 146 bpm (avg 111 bpm);  the run with the fastest interval was also the longest. 12 Supraventricular Tachycardia runs occurred, the run with  the fastest interval lasting 7 beats with a max rate of 179 bpm, the longest lasting 11.0 secs with an avg rate of 124 bpm.  Supraventricular Tachycardia was detected within +/- 45 seconds of symptomatic patient event(s). Isolated SVEs were rare (<1.0%), SVE Couplets were rare (<1.0%), and SVE Triplets were rare (<1.0%). Isolated VEs were rare (<1.0%), VE Couplets were rare  (<1.0%), and no VE Triplets were present.   ECHO: 08/29/2021   1. Inferior basal hypokinesis . Left ventricular ejection fraction, by  estimation, is 50 to 55%. The left ventricle has low normal function. The left ventricle demonstrates regional wall motion abnormalities (see scoring diagram/findings for description). Left ventricular diastolic parameters were normal.   2. Right ventricular systolic function is mildly reduced. The right  ventricular size is mildly enlarged. There is normal pulmonary artery  systolic pressure.   3. Left atrial size was mildly dilated.   4. Right atrial size was mildly dilated.   5. The mitral valve is normal in structure. No evidence of mitral valve regurgitation. No evidence of mitral stenosis.   6. The aortic valve is normal in structure. Aortic valve regurgitation is not visualized. No aortic stenosis is present.   7. Aortic dilatation noted. There is mild dilatation of the ascending  aorta, measuring 41 mm.   8. The inferior vena cava is normal in size with greater than 50%  respiratory variability, suggesting right atrial pressure of 3 mmHg.    CARDIAC CTA: 08/29/2021  Aortic Valve:  Trileaflet.  No calcifications.   Coronary Arteries:  Normal coronary origin.  Right dominance.   RCA is a large dominant artery that gives rise to PDA and PLA. There is no plaque.   Left main is a large artery that gives rise to LAD and LCX arteries.   LAD is a large vessel that has no plaque.   LCX is a non-dominant artery that gives rise to one large OM1 branch. There is no plaque.    Coronary Calcium Score:   Left main: 0   Left anterior descending artery: 0   Left circumflex artery: 0   Right coronary artery: 0   Total: 0   Percentile: 0   Other findings:   Normal pulmonary vein drainage into the left atrium.   Normal left atrial appendage without a thrombus.   Normal size of the pulmonary artery.   IMPRESSION: 1. Coronary calcium score of 0. This was 0 percentile for age and sex matched control.   2. Normal coronary origin with right dominance.   3. No evidence of CAD. CAD-RADS 0. No evidence of CAD (0%). Consider non-atherosclerotic causes of chest pain.   4. Proximal ascending aorta dilatation.   RADIOLOGY FINDINGS: FINDINGS: Vascular: Fusiform ectasia of the ascending aorta measuring up to 42 mm. No additional significant extra cardiac vascular abnormality.   Mediastinum/Nodes: No enlarged mediastinal or axillary lymph nodes. The trachea and esophagus demonstrate no  significant findings.   Lungs/Pleura: No focal consolidation, pleural effusion, or pneumothorax.   Upper Abdomen: No acute abnormality.   Musculoskeletal: No chest wall mass or suspicious bone lesions identified.   IMPRESSION: Fusiform aneurysmal dilation of the visualized ascending thoracic aorta measuring up to 4.2 cm. Recommend annual imaging followup by CTA or MRA. This recommendation follows 2010 ACCF/AHA/AATS/ACR/ASA/SCA/SCAI/SIR/STS/SVM Guidelines for the Diagnosis and Management of Patients with Thoracic Aortic Disease.    EKG:  EKG is not ordered today.   Recent Labs: 08/28/2021: ALT 22; BUN 19; Creatinine, Ser 0.98; Hemoglobin 11.9; Magnesium 1.9; Platelets 276; Potassium 4.0; Sodium 140; TSH 4.749  Recent Lipid Panel    Component Value Huffman/Time   CHOL 161 07/19/2017 0900   TRIG 95 07/19/2017 0900   HDL 42 (L) 07/19/2017 0900   CHOLHDL 3.8 07/19/2017 0900   LDLCALC 100 (H) 07/19/2017 0900    Home Medications   Current Meds  Medication Sig    ibuprofen (ADVIL) 600 MG tablet Take 1 tablet (600 mg total) by mouth every 6 (six) hours as needed.   levothyroxine (SYNTHROID, LEVOTHROID) 300 MCG tablet Take 1 tablet (300 mcg total) by mouth daily before breakfast.   loratadine-pseudoephedrine (CLARITIN-D 24 HOUR) 10-240 MG 24 hr tablet Take 1 tablet by mouth daily as needed.   metoprolol tartrate (LOPRESSOR) 25 MG tablet Take 1 tablet (25 mg total) by mouth 2 (two) times daily.   Multiple Vitamin (MULTIVITAMIN WITH MINERALS) TABS tablet Take 1 tablet by mouth daily.   phenazopyridine (PYRIDIUM) 200 MG tablet Take 1 tablet (200 mg total) by mouth 3 (three) times daily as needed for pain.     Review of Systems      All other systems reviewed and are otherwise negative except as noted above.  Physical Exam    VS:  BP 112/70    Pulse (!) 55    Ht  (1.803 m)    Wt (!) 312 lb (141.5 kg)    BMI 43.52 kg/m  , BMI Body mass index is 43.52 kg/m.  Wt Readings from Last 3 Encounters:  09/25/21 (!) 312 lb (141.5 kg)  08/28/21 (!) 304 lb 14.3 oz (138.3 kg)  01/18/20 300 lb (136.1 kg)     GEN: Well nourished, well developed, in no acute distress. HEENT: normal. Neck: Supple, no JVD, carotid bruits, or masses. Cardiac: RRR, no murmurs, rubs, or gallops. No clubbing, cyanosis, edema.  Radials/PT 2+ and equal bilaterally.  Respiratory:  Respirations regular and unlabored, clear to auscultation bilaterally. GI: Soft, nontender, nondistended. MS: No deformity or atrophy. Skin: Warm and dry, no rash. Neuro:  Strength and sensation are intact. Psych: Normal affect.  Assessment & Plan    SVT -Resolved spontaneously -Continues to have episodes  -Beta-blocker started inpatient, metoprolol tartrate 25 mg twice daily -ZIO monitor showed mean heart rate 46 bpm, max heart rate 179 bpm and an average heart rate of 75 bpm.  She did have 2 runs of ventricular tachycardia and 12 runs of SVT the longest lasting 11 seconds with an average heart rate  of 124 bpm. -Avoid caffeine and anything with pseudoephedrine -Will check TSH since last one was elevated -Will suggest PCP follow-up for anxiety -Refer to EP  2.  Hypertension -Well-controlled today in the clinic -Continue to take your blood pressure at home -Continue low-sodium heart healthy diet  3.  Hypothyroidism -Currently on levothyroxine -Recent TSH 4.749 done 11/22 -We ordered TSH today -Would recommend following up with PCP for titration  4.  Anxiety -May be contributing to her arrhythmia -Currently on no medication but is taking CBD oil and GABA -Would recommend follow-up with PCP  5. Ascending Aorta dilatation -measuring 4.2 cm on cardiac CT -recommend annual follow-up either with CT or echocardiogram  Disposition: Follow up in 4 month(s) with Chilton Si, MD or APP.  Signed, Sharlene Dory, PA-C 09/25/2021, 4:49 PM [facility]

## 2021-09-25 ENCOUNTER — Ambulatory Visit (HOSPITAL_BASED_OUTPATIENT_CLINIC_OR_DEPARTMENT_OTHER): Payer: 59 | Admitting: Physician Assistant

## 2021-09-25 ENCOUNTER — Other Ambulatory Visit: Payer: Self-pay

## 2021-09-25 ENCOUNTER — Encounter (HOSPITAL_BASED_OUTPATIENT_CLINIC_OR_DEPARTMENT_OTHER): Payer: Self-pay | Admitting: Physician Assistant

## 2021-09-25 VITALS — BP 112/70 | HR 55 | Ht 71.0 in | Wt 312.0 lb

## 2021-09-25 DIAGNOSIS — I10 Essential (primary) hypertension: Secondary | ICD-10-CM

## 2021-09-25 DIAGNOSIS — F411 Generalized anxiety disorder: Secondary | ICD-10-CM

## 2021-09-25 DIAGNOSIS — I471 Supraventricular tachycardia, unspecified: Secondary | ICD-10-CM

## 2021-09-25 DIAGNOSIS — E039 Hypothyroidism, unspecified: Secondary | ICD-10-CM | POA: Diagnosis not present

## 2021-09-25 DIAGNOSIS — I7121 Aneurysm of the ascending aorta, without rupture: Secondary | ICD-10-CM

## 2021-09-25 NOTE — Patient Instructions (Signed)
Medication Instructions:  Your physician has recommended you make the following change in your medication:   Change: you may take and extra half tablet of Metoprolol if you are feeling a lot of palpitations   *If you need a refill on your cardiac medications before your next appointment, please call your pharmacy*   Lab Work: Your physician recommends that you return for lab work Today for Thyroid Panel with TSH (3rd floor)   If you have labs (blood work) drawn today and your tests are completely normal, you will receive your results only by: MyChart Message (if you have MyChart) OR A paper copy in the mail If you have any lab test that is abnormal or we need to change your treatment, we will call you to review the results.   Testing/Procedures: None ordered    Follow-Up: At [facility], you and your health needs are our priority.  As part of our continuing mission to provide you with exceptional heart care, we have created designated Provider Care Teams.  These Care Teams include your primary Cardiologist (physician) and Advanced Practice Providers (APPs -  Physician Assistants and Nurse Practitioners) who all work together to provide you with the care you need, when you need it.  We recommend signing up for the patient portal called "MyChart".  Sign up information is provided on this After Visit Summary.  MyChart is used to connect with patients for Virtual Visits (Telemedicine).  Patients are able to view lab/test results, encounter notes, upcoming appointments, etc.  Non-urgent messages can be sent to your provider as well.   To learn more about what you can do with MyChart, go to [email or web address].    Your next appointment:   3-4 month(s)  The format for your next appointment:   In Person  Provider:   Chilton Si, MD, Gillian Shields, NP, or Edd Fabian, NP    Other Instructions Recommend establishing with a primary care provider.  You may call [facility] @ [phone] for a list of primary care providers in your area or visit their website [email or web address] Please have any insurance card available before calling or going online.   To prevent palpitations: Make sure you are adequately hydrated.  Avoid and/or limit caffeine containing beverages like soda or tea. Exercise regularly.  Manage stress well. Some over the counter medications can cause palpitations such as Benadryl, AdvilPM, TylenolPM. Regular Advil or Tylenol do not cause palpitations.  We have placed a referral for you to Electrophysiology! They will reach out to you to schedule an appointment!

## 2021-09-26 LAB — THYROID PANEL WITH TSH
Free Thyroxine Index: 0.9 — ABNORMAL LOW (ref 1.2–4.9)
T3 Uptake Ratio: 21 % — ABNORMAL LOW (ref 24–39)
T4, Total: 4.5 ug/dL (ref 4.5–12.0)
TSH: 33.1 u[IU]/mL — ABNORMAL HIGH (ref 0.450–4.500)

## 2021-09-26 NOTE — Addendum Note (Signed)
Addended by: Marlene Lard on: 09/26/2021 03:19 PM   Modules accepted: Orders

## 2021-09-26 NOTE — Progress Notes (Signed)
Called patient and shared her lab results with her. Gave PCP [facility] number and am referring patient to endocrinology.

## 2021-10-08 ENCOUNTER — Telehealth: Payer: Self-pay | Admitting: Cardiovascular Disease

## 2021-10-08 NOTE — Telephone Encounter (Signed)
Patient called and would like for Dr. Duke Salvia or her nurse to give her a phone call

## 2021-10-08 NOTE — Telephone Encounter (Signed)
Returned call to pt. She state she had a question regarding cold medication but already spoke to someone who answered her question.

## 2021-10-20 ENCOUNTER — Other Ambulatory Visit: Payer: Self-pay

## 2021-10-20 ENCOUNTER — Other Ambulatory Visit (HOSPITAL_COMMUNITY): Payer: Self-pay

## 2021-10-20 ENCOUNTER — Encounter (HOSPITAL_BASED_OUTPATIENT_CLINIC_OR_DEPARTMENT_OTHER): Payer: Self-pay | Admitting: Family Medicine

## 2021-10-20 ENCOUNTER — Ambulatory Visit (HOSPITAL_BASED_OUTPATIENT_CLINIC_OR_DEPARTMENT_OTHER): Payer: 59 | Admitting: Family Medicine

## 2021-10-20 VITALS — BP 124/96 | HR 71 | Ht 70.0 in | Wt 305.2 lb

## 2021-10-20 DIAGNOSIS — E039 Hypothyroidism, unspecified: Secondary | ICD-10-CM | POA: Diagnosis not present

## 2021-10-20 DIAGNOSIS — I10 Essential (primary) hypertension: Secondary | ICD-10-CM | POA: Insufficient documentation

## 2021-10-20 MED ORDER — LEVOTHYROXINE SODIUM 300 MCG PO TABS: 300.0000 ug | ORAL_TABLET | Freq: Every day | ORAL | 1 refills | Status: DC

## 2021-10-20 MED ORDER — LEVOTHYROXINE SODIUM 25 MCG PO TABS: 25.0000 ug | ORAL_TABLET | Freq: Every day | ORAL | 1 refills | Status: DC

## 2021-10-20 NOTE — Assessment & Plan Note (Signed)
History of hypertension, only on metoprolol due to recent issues with SVT Blood pressure borderline in office today, systolic at goal, diastolic slightly above goal Recommend home monitoring, recommend DASH diet Aerobic exercise can also be helpful in regards to blood pressure control as well as working towards healthy, gradual weight loss Continue to monitor at future office visits

## 2021-10-20 NOTE — Progress Notes (Signed)
New Patient Office Visit  Subjective:  Patient ID: Carla Huffman, female    DOB: 22-Feb-1971  Age: 51 y.o. MRN: [id number]  CC:  Chief Complaint  Patient presents with   Establish Care    Patient has not had PCP in the last 3 years   Thyroid Problem    Patient states she was told her thyroid levels were off a few weeks ago. She is currently on Levothyroxine 300 for management. She states she had a thyroidectomy 22 years ago.    HPI Carla Huffman is a 51 year old female presenting to establish in clinic.  She has current concerns as outlined above.  Past medical history significant for hypertension, hypothyroidism.  Recently established with cardiology related to elevated heart rate and found to have SVT.  She has been taking metoprolol as a result.  Recent laboratory evaluation with cardiology also revealed notably elevated TSH.  She has been referred to endocrinology by her cardiologist, on review, it does indicate that referral is ready to be scheduled, however patient has not proceeded with this.  Hypothyroidism: Reports having thyroidectomy about 22 years ago, currently taking levothyroxine, has been taking current 300 mcg dose for years.  Symptomatically, patient does endorse some fatigue/tiredness.  Otherwise, not endorsing significant symptoms such as temperature intolerance, change in bowel habits.  As above, had notably elevated TSH on recent lab work  Patient is originally from [place] area.  She currently works for [employer].  Outside of work she enjoys hiking, although occasionally has some foot pain for which she has been seeing podiatry.  Past Medical History:  Diagnosis Date   Anxiety    Hypertension    Hypothyroidism     Past Surgical History:  Procedure Laterality Date   CESAREAN SECTION     THYROIDECTOMY      Family History  Problem Relation Age of Onset   Breast cancer Mother 70   Brain cancer Father 58   Breast cancer Other 11       Half  sister--same father    Social History   Socioeconomic History   Marital status: Married    Spouse name: Not on file   Number of children: Not on file   Years of education: Not on file   Highest education level: Not on file  Occupational History   Not on file  Tobacco Use   Smoking status: Former    Types: Cigarettes    Quit date: 10/06/1995    Years since quitting: 26.0   Smokeless tobacco: Never  Vaping Use   Vaping Use: Never used  Substance and Sexual Activity   Alcohol use: Yes    Comment: ocassionaly   Drug use: No   Sexual activity: Yes  Other Topics Concern   Not on file  Social History Narrative   Not on file   Social Determinants of Health   Financial Resource Strain: Not on file  Food Insecurity: Not on file  Transportation Needs: Not on file  Physical Activity: Not on file  Stress: Not on file  Social Connections: Not on file  Intimate Partner Violence: Not on file    Objective:   Today's Vitals: BP (!) 124/96    Pulse 71    Ht '5\' 10"'  (1.778 m)    Wt (!) 305 lb 3.2 oz (138.4 kg)    SpO2 98%    BMI 43.79 kg/m   Physical Exam  51 year old female in no acute distress Cardiovascular exam with regular rate  and rhythm, no murmur appreciated Lungs clear to auscultation bilaterally  Assessment & Plan:   Problem List Items Addressed This Visit       Cardiovascular and Mediastinum   Hypertension    History of hypertension, only on metoprolol due to recent issues with SVT Blood pressure borderline in office today, systolic at goal, diastolic slightly above goal Recommend home monitoring, recommend DASH diet Aerobic exercise can also be helpful in regards to blood pressure control as well as working towards healthy, gradual weight loss Continue to monitor at future office visits        Endocrine   Hypothyroidism - Primary    Has had some generalized symptoms of fatigue over the last couple months TSH found to be slightly elevated about 2 months ago  with more noticeable elevation at 33 about 1 month ago Will increase levothyroxine to 325 mcg daily and plan to recheck TSH in 6 to 8 weeks      Relevant Medications   levothyroxine (SYNTHROID) 25 MCG tablet   Other Relevant Orders   TSH   Parathyroid hormone, intact (no Ca)   Calcium, ionized   VITAMIN D 25 Hydroxy (Vit-D Deficiency, Fractures)   Phosphorus     Other   Hypocalcemia    Noted on recent labs, albumin slightly low, however still with hypocalcemia with corrected calcium Will check ionized calcium as well as PTH today.  We will also check vitamin D and phosphate Previous labs have checked alk phos and magnesium which have both been normal previously       Outpatient Encounter Medications as of 10/20/2021  Medication Sig   ibuprofen (ADVIL) 600 MG tablet Take 1 tablet (600 mg total) by mouth every 6 (six) hours as needed.   levothyroxine (SYNTHROID) 25 MCG tablet Take 1 tablet (25 mcg total) by mouth daily.   levothyroxine (SYNTHROID, LEVOTHROID) 300 MCG tablet Take 1 tablet (300 mcg total) by mouth daily before breakfast.   loratadine-pseudoephedrine (CLARITIN-D 24 HOUR) 10-240 MG 24 hr tablet Take 1 tablet by mouth daily as needed.   metoprolol tartrate (LOPRESSOR) 25 MG tablet Take 1 tablet (25 mg total) by mouth 2 (two) times daily.   MISC NATURAL PRODUCTS PO Take by mouth. Seamoss - take daily in smoothie   MISC NATURAL PRODUCTS PO Take by mouth. Hemp Protein Powder - Take daily in smoothie   Multiple Vitamin (MULTIVITAMIN WITH MINERALS) TABS tablet Take 1 tablet by mouth daily.   [DISCONTINUED] phenazopyridine (PYRIDIUM) 200 MG tablet Take 1 tablet (200 mg total) by mouth 3 (three) times daily as needed for pain. (Patient not taking: Reported on 10/20/2021)   No facility-administered encounter medications on file as of 10/20/2021.    Follow-up: Return in about 8 weeks (around 12/15/2021).   Anniemae Haberkorn J [place], MD

## 2021-10-20 NOTE — Assessment & Plan Note (Signed)
Has had some generalized symptoms of fatigue over the last couple months TSH found to be slightly elevated about 2 months ago with more noticeable elevation at 33 about 1 month ago Will increase levothyroxine to 325 mcg daily and plan to recheck TSH in 6 to 8 weeks

## 2021-10-20 NOTE — Patient Instructions (Signed)
°  Medication Instructions:  Your physician has recommended you make the following change in your medication:  -- ADD 25 mcg Levothyroxine daily to your 300 mcg tablet for a total dose of 325 mcg daily  --If you need a refill on any your medications before your next appointment, please call your pharmacy first. If no refills are authorized on file call the office.-- Lab Work: Your physician has recommended that you have lab work today: Phosphorus, Ionized Calcium, Parathyroid Hormone, Vitamin D If you have labs (blood work) drawn today and your tests are completely normal, you will receive your results via MyChart message OR a phone call from our staff.  Please ensure you check your voicemail in the event that you authorized detailed messages to be left on a delegated number. If you have any lab test that is abnormal or we need to change your treatment, we will call you to review the results.  Follow-Up: Your next appointment:   Your physician recommends that you schedule a follow-up appointment in: 6-8 WEEKS with Dr. de [place]  You will receive a text message or e-mail with a link to a survey about your care and experience with [place] today! We would greatly appreciate your feedback!   Thanks for letting us be apart of your health journey!!  Primary Care and Sports Medicine   Dr. Ceasar Mons [place]   We encourage you to activate your patient portal called "MyChart".  Sign up information is provided on this After Visit Summary.  MyChart is used to connect with patients for Virtual Visits (Telemedicine).  Patients are able to view lab/test results, encounter notes, upcoming appointments, etc.  Non-urgent messages can be sent to your provider as well. To learn more about what you can do with MyChart, please visit --  [email or web address].

## 2021-10-20 NOTE — Assessment & Plan Note (Signed)
Noted on recent labs, albumin slightly low, however still with hypocalcemia with corrected calcium Will check ionized calcium as well as PTH today.  We will also check vitamin D and phosphate Previous labs have checked alk phos and magnesium which have both been normal previously

## 2021-10-21 LAB — PHOSPHORUS: Phosphorus: 5.1 mg/dL — ABNORMAL HIGH (ref 3.0–4.3)

## 2021-10-21 LAB — CALCIUM, IONIZED: Calcium, Ion: 4.1 mg/dL — ABNORMAL LOW (ref 4.5–5.6)

## 2021-10-21 LAB — VITAMIN D 25 HYDROXY (VIT D DEFICIENCY, FRACTURES): Vit D, 25-Hydroxy: 32.6 ng/mL (ref 30.0–100.0)

## 2021-10-21 LAB — PARATHYROID HORMONE, INTACT (NO CA): PTH: 8 pg/mL — ABNORMAL LOW (ref 15–65)

## 2021-10-25 ENCOUNTER — Emergency Department (HOSPITAL_BASED_OUTPATIENT_CLINIC_OR_DEPARTMENT_OTHER): Payer: 59 | Admitting: Radiology

## 2021-10-25 ENCOUNTER — Encounter (HOSPITAL_BASED_OUTPATIENT_CLINIC_OR_DEPARTMENT_OTHER): Payer: Self-pay | Admitting: Cardiovascular Disease

## 2021-10-25 ENCOUNTER — Encounter (HOSPITAL_BASED_OUTPATIENT_CLINIC_OR_DEPARTMENT_OTHER): Payer: Self-pay

## 2021-10-25 ENCOUNTER — Other Ambulatory Visit: Payer: Self-pay

## 2021-10-25 ENCOUNTER — Emergency Department (HOSPITAL_BASED_OUTPATIENT_CLINIC_OR_DEPARTMENT_OTHER)
Admission: EM | Admit: 2021-10-25 | Discharge: 2021-10-26 | Disposition: A | Discharge status: Home / Self Care | Payer: 59 | Attending: Emergency Medicine | Admitting: Emergency Medicine

## 2021-10-25 DIAGNOSIS — R002 Palpitations: Secondary | ICD-10-CM | POA: Diagnosis not present

## 2021-10-25 DIAGNOSIS — R0602 Shortness of breath: Secondary | ICD-10-CM | POA: Insufficient documentation

## 2021-10-25 NOTE — ED Triage Notes (Signed)
Patient here POV from Home with SOB.  Patient states it has happened in the Past but that today it has been worse.  SOB at Rest and with Exertion. No Pain. No N/V/D. No Fevers.   A&Ox4. Gcs 15. NAD Noted during Triage. Ambulatory.

## 2021-10-26 NOTE — Discharge Instructions (Addendum)
Future Appointments  Date Time Provider [place]  11/12/2021  8:00 AM Vickie Epley, MD [facility]  12/15/2021  3:10 PM de [place], Raymond J, MD [facility]  01/22/2022 10:00 AM Skeet Latch, MD [facility]    Please contact your cardiology and primary care office to request follow-up appointment.  If you have any further episodes of palpitations, lightheadedness, shortness of breath or any episodes of passing out, please return immediately to ER for reassessment.

## 2021-10-27 NOTE — ED Provider Notes (Signed)
[facility] EMERGENCY DEPT Provider Note   CSN: [id number] Arrival date & time: 10/25/21  1828     History  Chief Complaint  Patient presents with   Shortness of Breath    Carla Huffman is a 51 y.o. female.  Presenting to ER with concern for episode of shortness of breath.  Patient reports that the episode today occurred sometime this afternoon, lasted for few seconds and then subsided.  States that episode today is similar to episodes she has had in the past.  Has not had any additional episodes later this evening.  No further difficulty breathing while in the emergency room.  She did not feel lightheaded or have any episodes of passing out.  Did have sensation of palpitations during the episode.  Completed extensive chart review, reviewed recent discharge summary, admitted in November for SVT, elevated troponin.  Followed up with cardiology for 14-day long-term monitor.  Patient had 12 runs of SVT, longest lasting 11 seconds.  Had 2 nonsustained VT runs, 7 beats.  Patient was referred to EP.  Has not had the EP follow-up appointment, scheduled for February 8.    HPI     Home Medications Prior to Admission medications   Medication Sig Start Date End Date Taking? Authorizing Provider  ibuprofen (ADVIL) 600 MG tablet Take 1 tablet (600 mg total) by mouth every 6 (six) hours as needed. 08/26/21   Melynda Ripple, MD  levothyroxine (SYNTHROID) 25 MCG tablet Take 1 tablet (25 mcg total) by mouth daily. 10/20/21   de [place], Raymond J, MD  levothyroxine (SYNTHROID) 300 MCG tablet Take 1 tablet (300 mcg total) by mouth daily before breakfast. 10/20/21   [place], Blondell Reveal, MD  loratadine-pseudoephedrine (CLARITIN-D 24 HOUR) 10-240 MG 24 hr tablet Take 1 tablet by mouth daily as needed.    [provider]  metoprolol tartrate (LOPRESSOR) 25 MG tablet Take 1 tablet (25 mg total) by mouth 2 (two) times daily. 08/29/21   Barrett, Evelene Croon, PA-C  MISC NATURAL PRODUCTS PO  Take by mouth. Seamoss - take daily in smoothie    [provider]  MISC NATURAL PRODUCTS PO Take by mouth. Hemp Protein Powder - Take daily in smoothie    [provider]  Multiple Vitamin (MULTIVITAMIN WITH MINERALS) TABS tablet Take 1 tablet by mouth daily.    [provider]      Allergies    Keflex [cephalexin] and Levofloxacin    Review of Systems   Review of Systems  Constitutional:  Negative for chills and fever.  HENT:  Negative for ear pain and sore throat.   Eyes:  Negative for pain and visual disturbance.  Respiratory:  Positive for shortness of breath. Negative for cough.   Cardiovascular:  Positive for palpitations. Negative for chest pain.  Gastrointestinal:  Negative for abdominal pain and vomiting.  Genitourinary:  Negative for dysuria and hematuria.  Musculoskeletal:  Negative for arthralgias and back pain.  Skin:  Negative for color change and rash.  Neurological:  Negative for seizures and syncope.  All other systems reviewed and are negative.  Physical Exam Updated Vital Signs BP 120/74    Pulse 73    Temp 97.9 F (36.6 C)    Resp 17    Ht 5\' 10"  (1.778 m)    Wt (!) 138.4 kg    SpO2 100%    BMI 43.78 kg/m  Physical Exam Vitals and nursing note reviewed.  Constitutional:      General: She is  not in acute distress.    Appearance: She is well-developed.  HENT:     Head: Normocephalic and atraumatic.  Eyes:     Conjunctiva/sclera: Conjunctivae normal.  Cardiovascular:     Rate and Rhythm: Normal rate and regular rhythm.     Heart sounds: No murmur heard. Pulmonary:     Effort: Pulmonary effort is normal. No respiratory distress.     Breath sounds: Normal breath sounds.  Abdominal:     Palpations: Abdomen is soft.     Tenderness: There is no abdominal tenderness.  Musculoskeletal:        General: No swelling.     Cervical back: Neck supple.  Skin:    General: Skin is warm and dry.     Capillary Refill: Capillary refill takes  less than 2 seconds.  Neurological:     Mental Status: She is alert.  Psychiatric:        Mood and Affect: Mood normal.    ED Results / Procedures / Treatments   Labs (all labs ordered are listed, but only abnormal results are displayed) Labs Reviewed - No data to display  EKG EKG Interpretation  Date/Time:  Saturday October 25 2021 18:49:13 EST Ventricular Rate:  75 PR Interval:  164 QRS Duration: 90 QT Interval:  398 QTC Calculation: 444 R Axis:   24 Text Interpretation: Normal sinus rhythm Normal ECG When compared with ECG of 28-Aug-2021 22:17, No significant change was found Confirmed by Madalyn Rob [phone]) on 10/26/2021 1:25:40 AM  Radiology DG Chest 2 View  Result Date: 10/25/2021 CLINICAL DATA:  Shortness of breath EXAM: CHEST - 2 VIEW COMPARISON:  08/28/2021 FINDINGS: The heart size and mediastinal contours are within normal limits. Surgical clips about the lower neck, likely related to prior thyroidectomy. Pulmonary vascular prominence. The visualized skeletal structures are unremarkable. IMPRESSION: Pulmonary vascular prominence without overt pulmonary edema. No focal airspace opacity. Electronically Signed   By: Delanna Ahmadi M.D.   On: 10/25/2021 19:24    Procedures Procedures    Medications Ordered in ED Medications - No data to display  ED Course/ Medical Decision Making/ A&P                           Medical Decision Making Amount and/or Complexity of Data Reviewed Radiology: ordered.   51 year old lady presents to the emergency room with concern for episode of shortness of breath/palpitations.  When I evaluated patient she did not have any ongoing symptoms.  Reviewed EKG from today, demonstrating sinus rhythm, no acute changes appreciated.  Cardiac monitor reviewed, sinus rhythm.  Normal heart rate.  CXR radiologist said pulmonary vascular prominence without overt pulmonary edema.  Independently reviewed, do not see any significant edema or infiltrate.   Completed extensive chart review including cardiology notes, ZIO monitor results, discharge summary.  Patient was admitted for SVT.  ZIO monitor showing patient having occasional brief SVT episodes as well as an episode of brief nonsustained VT.  Per cardiology recommendations, they were planning to have patient follow-up with electrophysiology for further management in an outpatient basis.  Strong suspicion that patient's symptoms from today may have been from a brief SVT episode.  She denied any lightheadedness or syncope.  Initially recommended to patient that we obtain basic lab work to rule out any anemia or electrolyte derangement.  Patient stated that because she has not had any further episodes and feels fine at present just wanted to go home, ER visit  had been almost 7 hours at the time.  Discussed risk/benefits of further monitoring in ER and additional work-up versus discharge without any additional testing.  Patient demonstrated understanding and will be discharged home.  She is currently in stable condition.  Recommended that she contact the cardiology office to request closer follow-up.  Has appointment on 2/8 with EP.  Discussed return precautions should she have additional episodes, more frequent episodes or any episodes of lightheadedness/passing out or chest pain.  Additionally recommended that she follow-up with her primary care doctor in the meantime.    After the discussed management above, the patient was determined to be safe for discharge.  The patient was in agreement with this plan and all questions regarding their care were answered.  ED return precautions were discussed and the patient will return to the ED with any significant worsening of condition.         Final Clinical Impression(s) / ED Diagnoses Final diagnoses:  Palpitation  Shortness of breath    Rx / DC Orders ED Discharge Orders     None         Lucrezia Starch, MD 10/27/21 [phone]

## 2021-11-11 ENCOUNTER — Other Ambulatory Visit (HOSPITAL_BASED_OUTPATIENT_CLINIC_OR_DEPARTMENT_OTHER): Payer: Self-pay | Admitting: Family Medicine

## 2021-11-12 ENCOUNTER — Other Ambulatory Visit: Payer: Self-pay

## 2021-11-12 ENCOUNTER — Ambulatory Visit: Payer: 59 | Admitting: Cardiology

## 2021-11-12 ENCOUNTER — Encounter: Payer: Self-pay | Admitting: Cardiology

## 2021-11-12 ENCOUNTER — Telehealth: Payer: Self-pay | Admitting: Cardiology

## 2021-11-12 VITALS — BP 112/86 | HR 76 | Ht 70.0 in | Wt 303.0 lb

## 2021-11-12 DIAGNOSIS — I10 Essential (primary) hypertension: Secondary | ICD-10-CM

## 2021-11-12 DIAGNOSIS — F411 Generalized anxiety disorder: Secondary | ICD-10-CM | POA: Diagnosis not present

## 2021-11-12 DIAGNOSIS — I471 Supraventricular tachycardia: Secondary | ICD-10-CM

## 2021-11-12 NOTE — Telephone Encounter (Signed)
Patient calling to schedule procedure mentioned today at visit. Any time works for patient no scheduling time restrictions.

## 2021-11-12 NOTE — Progress Notes (Signed)
Electrophysiology Office Note:    Date:  11/12/2021   ID:  Carla Huffman, DOB Aug 25, 1971, MRN [id number]  PCP:  de [place], Raymond J, MD  [facility] Cardiologist:  Chilton Si, MD  [facility] Electrophysiologist:  Lanier Prude, MD   Referring MD: Sharlene Dory, PA-C   Chief Complaint: SVT  History of Present Illness:    Carla Huffman is a 52 y.o. female who presents for an evaluation of SVT at the request of Jari Favre, [place]. Their medical history includes GAD, hTN, hypothyroidism.  She saw Julian Hy 09/25/2021 for follow up. She was admitted 08/28/2021 with SVT that resolved spontaneously. She is symptomatic when in SVT. She presented to the ER 10/25/2021 with dyspnea.   She has actually had 2 episodes of SVT that required ER visits.  She has received adenosine in the past which resolved the arrhythmia.  She said her episodes started in her 60s and have become more frequent and intense.  She feels chest pressure and shortness of breath when she is in her supraventricular tachycardia.     Past Medical History:  Diagnosis Date   Anxiety    Hypertension    Hypothyroidism     Past Surgical History:  Procedure Laterality Date   CESAREAN SECTION     THYROIDECTOMY      Current Medications: Current Meds  Medication Sig   ibuprofen (ADVIL) 600 MG tablet Take 1 tablet (600 mg total) by mouth every 6 (six) hours as needed.   levothyroxine (SYNTHROID) 25 MCG tablet TAKE 1 TABLET BY MOUTH EVERY DAY   levothyroxine (SYNTHROID) 300 MCG tablet Take 1 tablet (300 mcg total) by mouth daily before breakfast.   metoprolol tartrate (LOPRESSOR) 25 MG tablet Take 1 tablet (25 mg total) by mouth 2 (two) times daily.   MISC NATURAL PRODUCTS PO Take by mouth. Seamoss - take daily in smoothie   MISC NATURAL PRODUCTS PO Take by mouth. Hemp Protein Powder - Take daily in smoothie   Multiple Vitamin (MULTIVITAMIN WITH MINERALS) TABS tablet Take 1 tablet by mouth daily.      Allergies:   Keflex [cephalexin] and Levofloxacin   Social History   Socioeconomic History   Marital status: Married    Spouse name: Not on file   Number of children: Not on file   Years of education: Not on file   Highest education level: Not on file  Occupational History   Not on file  Tobacco Use   Smoking status: Former    Types: Cigarettes    Quit date: 10/06/1995    Years since quitting: 26.1   Smokeless tobacco: Never  Vaping Use   Vaping Use: Never used  Substance and Sexual Activity   Alcohol use: Yes    Comment: ocassionaly   Drug use: No   Sexual activity: Yes  Other Topics Concern   Not on file  Social History Narrative   Not on file   Social Determinants of Health   Financial Resource Strain: Not on file  Food Insecurity: Not on file  Transportation Needs: Not on file  Physical Activity: Not on file  Stress: Not on file  Social Connections: Not on file     Family History: The patient's family history includes Brain cancer (age of onset: 41) in her father; Breast cancer (age of onset: 20) in an other family member; Breast cancer (age of onset: 80) in her mother.  ROS:   Please see the history of present illness.  All other systems reviewed and are negative.  EKGs/Labs/Other Studies Reviewed:    The following studies were reviewed today:  08/29/2021 Echo EF normal, 50-55% RV mildly reduced fx Mildly dilated RA/LA No significant valvular abnormalities 39mm aorta  09/22/2021 Zio personally reviewed 12 SVT, longest lasting 11 seconds. Review of strips suggests AT. Some patient triggered episodes correspond to SVT and others to sinus with rare PVC  08/28/2021 ECG SVT at 203 bpm. Narrow complex    EKG:  The ekg ordered today demonstrates sinus rhythm.  No preexcitation.  Intervals are normal.   Recent Labs: 08/28/2021: ALT 22; BUN 19; Creatinine, Ser 0.98; Hemoglobin 11.9; Magnesium 1.9; Platelets 276; Potassium 4.0; Sodium  140 09/25/2021: TSH 33.100  Recent Lipid Panel    Component Value Date/Time   CHOL 161 07/19/2017 0900   TRIG 95 07/19/2017 0900   HDL 42 (L) 07/19/2017 0900   CHOLHDL 3.8 07/19/2017 0900   LDLCALC 100 (H) 07/19/2017 0900    Physical Exam:    VS:  BP 112/86 (BP Location: Left Arm, Patient Position: Sitting, Cuff Size: Large)    Pulse 76    Ht 5\' 10"  (1.778 m)    Wt (!) 303 lb (137.4 kg)    SpO2 96%    BMI 43.48 kg/m     Wt Readings from Last 3 Encounters:  11/12/21 (!) 303 lb (137.4 kg)  10/25/21 (!) 305 lb 1.9 oz (138.4 kg)  10/20/21 (!) 305 lb 3.2 oz (138.4 kg)     GEN: Well nourished, well developed in no acute distress.  Obese HEENT: Normal NECK: No JVD; No carotid bruits LYMPHATICS: No lymphadenopathy CARDIAC: RRR, no murmurs, rubs, gallops RESPIRATORY:  Clear to auscultation without rales, wheezing or rhonchi  ABDOMEN: Soft, non-tender, non-distended MUSCULOSKELETAL:  No edema; No deformity  SKIN: Warm and dry NEUROLOGIC:  Alert and oriented x 3 PSYCHIATRIC:  Normal affect       ASSESSMENT:    1. Supraventricular tachycardia (HCC)   2. Primary hypertension   3. Generalized anxiety disorder    PLAN:    In order of problems listed above:  #SVT Symptomatic.  Recurrent.  Suspect she is having AVNRT although I cannot completely exclude other mechanisms of SVT including atrial tachycardia or AVRT.  I discussed all mechanisms of SVT during today's visit and treatment strategies.  Given her young age and recurrent episodes, I think she is a good candidate for EP study and possible ablation.  I did discuss the ablation procedure in detail with the patient include the risk, recovery and likelihood of success.  She would like to talk over the options with her husband and will let us know how she would like to proceed.  Risk, benefits, and alternatives to EP study and radiofrequency ablation for SVT were also discussed in detail today. These risks include but are not  limited to complete heart block, stroke, bleeding, vascular damage, tamponade, perforation, and death. The patient understands these risk and wishes to talk things over with her husband before making a final decision about how to proceed.    If she elects to proceed, CARTO and ice would be requested for the procedure.  She would also need to hold her beta-blocker for 5 days prior to the procedure if she elects to proceed.        Total time spent with patient today 47 minutes. This includes reviewing records, evaluating the patient and coordinating care.  Medication Adjustments/Labs and Tests Ordered: Current medicines are reviewed at  length with the patient today.  Concerns regarding medicines are outlined above.  Orders Placed This Encounter  Procedures   EKG 12-Lead   No orders of the defined types were placed in this encounter.    Signed, Rossie Muskrat. Lalla Brothers, MD, [facility], [facility] 11/12/2021 8:33 AM    Electrophysiology [facility]

## 2021-11-12 NOTE — Patient Instructions (Addendum)
Medications: Your physician recommends that you continue on your current medications as directed. Please refer to the Current Medication list given to you today. *If you need a refill on your cardiac medications before your next appointment, please call your pharmacy*  Lab Work: None. If you have labs (blood work) drawn today and your tests are completely normal, you will receive your results only by: [place] (if you have MyChart) OR A paper copy in the mail If you have any lab test that is abnormal or we need to change your treatment, we will call you to review the results.  Testing/Procedures: Your physician has recommended that you have an ablation. Catheter ablation is a medical procedure used to treat some cardiac arrhythmias (irregular heartbeats). During catheter ablation, a long, thin, flexible tube is put into a blood vessel in your groin (upper thigh), or neck. This tube is called an ablation catheter. It is then guided to your heart through the blood vessel. Radio frequency waves destroy small areas of heart tissue where abnormal heartbeats may cause an arrhythmia to start. Please see the instruction sheet given to you today.   Follow-Up: At [facility], you and your health needs are our priority.  As part of our continuing mission to provide you with exceptional heart care, we have created designated Provider Care Teams.  These Care Teams include your primary Cardiologist (physician) and Advanced Practice Providers (APPs -  Physician Assistants and Nurse Practitioners) who all work together to provide you with the care you need, when you need it.  Your physician wants you to follow-up in: as needed with Lars Mage. Please contact Dr. Mardene Speak nurse, Otila Kluver if you wish to move forward with ablation. [phone].   We recommend signing up for the patient portal called "MyChart".  Sign up information is provided on this After Visit Summary.  MyChart is used to connect with  patients for Virtual Visits (Telemedicine).  Patients are able to view lab/test results, encounter notes, upcoming appointments, etc.  Non-urgent messages can be sent to your provider as well.   To learn more about what you can do with MyChart, go to [email or web address].    Any Other Special Instructions Will Be Listed Below (If Applicable).   Cardiac Ablation Cardiac ablation is a procedure to destroy (ablate) some heart tissue that is sending bad signals. These bad signals cause problems in heart rhythm. The heart has many areas that make these signals. If there are problems in these areas, they can make the heart beat in a way that is not normal. Destroying some tissues can help make the heart rhythm normal. Tell your doctor about: Any allergies you have. All medicines you are taking. These include vitamins, herbs, eye drops, creams, and over-the-counter medicines. Any problems you or family members have had with medicines that make you fall asleep (anesthetics). Any blood disorders you have. Any surgeries you have had. Any medical conditions you have, such as kidney failure. Whether you are pregnant or may be pregnant. What are the risks? This is a safe procedure. But problems may occur, including: Infection. Bruising and bleeding. Bleeding into the chest. Stroke or blood clots. Damage to nearby areas of your body. Allergies to medicines or dyes. The need for a pacemaker if the normal system is damaged. Failure of the procedure to treat the problem. What happens before the procedure? Medicines Ask your doctor about: Changing or stopping your normal medicines. This is important. Taking aspirin and ibuprofen. Do not take these medicines  unless your doctor tells you to take them. Taking other medicines, vitamins, herbs, and supplements. General instructions Follow instructions from your doctor about what you cannot eat or drink. Plan to have someone take you home from the  hospital or clinic. If you will be going home right after the procedure, plan to have someone with you for 24 hours. Ask your doctor what steps will be taken to prevent infection. What happens during the procedure?  An IV tube will be put into one of your veins. You will be given a medicine to help you relax. The skin on your neck or groin will be numbed. A cut (incision) will be made in your neck or groin. A needle will be put through your cut and into a large vein. A tube (catheter) will be put into the needle. The tube will be moved to your heart. Dye may be put through the tube. This helps your doctor see your heart. Small devices (electrodes) on the tube will send out signals. A type of energy will be used to destroy some heart tissue. The tube will be taken out. Pressure will be held on your cut. This helps stop bleeding. A bandage will be put over your cut. The exact procedure may vary among doctors and hospitals. What happens after the procedure? You will be watched until you leave the hospital or clinic. This includes checking your heart rate, breathing rate, oxygen, and blood pressure. Your cut will be watched for bleeding. You will need to lie still for a few hours. Do not drive for 24 hours or as long as your doctor tells you. Summary Cardiac ablation is a procedure to destroy some heart tissue. This is done to treat heart rhythm problems. Tell your doctor about any medical conditions you may have. Tell him or her about all medicines you are taking to treat them. This is a safe procedure. But problems may occur. These include infection, bruising, bleeding, and damage to nearby areas of your body. Follow what your doctor tells you about food and drink. You may also be told to change or stop some of your medicines. After the procedure, do not drive for 24 hours or as long as your doctor tells you. This information is not intended to replace advice given to you by your health care  provider. Make sure you discuss any questions you have with your health care provider. Document Revised: 08/24/2019 Document Reviewed: 08/24/2019 Elsevier Patient Education  2022 [employer].

## 2021-11-13 NOTE — Telephone Encounter (Signed)
Contaced Pt.  Pt scheduled for SVT ablation March 24  Will get lab work on March 13 at [facility]  Instruction letter sent  Work up complete.

## 2021-11-13 NOTE — Telephone Encounter (Signed)
Patient calling to check status of scheduling .

## 2021-11-24 ENCOUNTER — Ambulatory Visit: Payer: 59 | Admitting: Internal Medicine

## 2021-12-10 ENCOUNTER — Other Ambulatory Visit (HOSPITAL_BASED_OUTPATIENT_CLINIC_OR_DEPARTMENT_OTHER): Payer: Self-pay | Admitting: Family Medicine

## 2021-12-15 ENCOUNTER — Ambulatory Visit (HOSPITAL_BASED_OUTPATIENT_CLINIC_OR_DEPARTMENT_OTHER): Payer: 59 | Admitting: Family Medicine

## 2021-12-16 ENCOUNTER — Encounter (HOSPITAL_BASED_OUTPATIENT_CLINIC_OR_DEPARTMENT_OTHER): Payer: Self-pay | Admitting: Family Medicine

## 2021-12-16 ENCOUNTER — Ambulatory Visit (HOSPITAL_BASED_OUTPATIENT_CLINIC_OR_DEPARTMENT_OTHER): Payer: 59 | Admitting: Family Medicine

## 2021-12-16 ENCOUNTER — Other Ambulatory Visit: Payer: Self-pay

## 2021-12-16 VITALS — BP 128/82 | HR 78 | Ht 70.0 in | Wt 301.9 lb

## 2021-12-16 DIAGNOSIS — E209 Hypoparathyroidism, unspecified: Secondary | ICD-10-CM | POA: Diagnosis not present

## 2021-12-16 DIAGNOSIS — E039 Hypothyroidism, unspecified: Secondary | ICD-10-CM

## 2021-12-16 NOTE — Progress Notes (Signed)
? ? ?  Procedures performed today:   ? ?None. ? ?Independent interpretation of notes and tests performed by another provider:  ? ?None. ? ?Brief History, Exam, Impression, and Recommendations:   ? ?BP 128/82   Pulse 78   Ht 5\' 10"  (1.778 m)   Wt (!) 301 lb 14.4 oz (136.9 kg)   SpO2 97%   BMI 43.32 kg/m?  ? ?Hypothyroidism ?Symptomatically, patient is feeling better having started on slightly higher dose of levothyroxine ?She did arrange for evaluation with endocrinology, however reports that she had to cancel the appointment due to her son being sick.  Has not rescheduled this.  Is requesting new referral to be placed to arrange for evaluation, she indicates planning to schedule after her upcoming procedure with cardiology later this month ?Check TSH today, adjust levothyroxine based on labs as indicated ? ?Hypoparathyroidism (HCC) ?Patient found to have low calcium, likely due to hyperparathyroidism given elevated phosphate, low calcium, low PTH, will check magnesium to ensure no concurrent hypomagnesemia ?Discussed role of calcium and vitamin D supplementation ?As above, also referring to endocrinology for further evaluation and recommendations ? ?Check labs today as above ?Referral to endocrinology for evaluation management of hyperparathyroidism as well as hypothyroidism ?Plan for follow-up in about 2 months ? ? ?___________________________________________ ?Carla Wiegand de , MD, ABFM, CAQSM ?Primary Care and Sports Medicine ?[facility] ?

## 2021-12-16 NOTE — Assessment & Plan Note (Signed)
Symptomatically, patient is feeling better having started on slightly higher dose of levothyroxine ?She did arrange for evaluation with endocrinology, however reports that she had to cancel the appointment due to her son being sick.  Has not rescheduled this.  Is requesting new referral to be placed to arrange for evaluation, she indicates planning to schedule after her upcoming procedure with cardiology later this month ?Check TSH today, adjust levothyroxine based on labs as indicated ?

## 2021-12-16 NOTE — Assessment & Plan Note (Signed)
Patient found to have low calcium, likely due to hyperparathyroidism given elevated phosphate, low calcium, low PTH, will check magnesium to ensure no concurrent hypomagnesemia ?Discussed role of calcium and vitamin D supplementation ?As above, also referring to endocrinology for further evaluation and recommendations ?

## 2021-12-16 NOTE — Patient Instructions (Signed)
?  Medication Instructions:  ?Your physician recommends that you continue on your current medications as directed. Please refer to the Current Medication list given to you today. ?--If you need a refill on any your medications before your next appointment, please call your pharmacy first. If no refills are authorized on file call the office.-- ?Lab Work: ?Your physician has recommended that you have lab work today: TSH / Magnesium ?If you have labs (blood work) drawn today and your tests are completely normal, you will receive your results via MyChart message OR a phone call from our staff.  ?Please ensure you check your voicemail in the event that you authorized detailed messages to be left on a delegated number. If you have any lab test that is abnormal or we need to change your treatment, we will call you to review the results. ? ?Referrals/Procedures/Imaging: ?Endocrinology ? ?Follow-Up: ?Your next appointment:   ?Your physician recommends that you schedule a follow-up appointment in: 2 months  with Dr. de Peru ? ?You will receive a text message or e-mail with a link to a survey about your care and experience with [place] today! We would greatly appreciate your feedback!  ? ?Thanks for letting us be apart of your health journey!!  ?Primary Care and Sports Medicine  ? ?Dr. Marcy Salvo de [place]  ? ?We encourage you to activate your patient portal called "MyChart".  Sign up information is provided on this After Visit Summary.  MyChart is used to connect with patients for Virtual Visits (Telemedicine).  Patients are able to view lab/test results, encounter notes, upcoming appointments, etc.  Non-urgent messages can be sent to your provider as well. To learn more about what you can do with MyChart, please visit --  [email or web address].   ? ?

## 2021-12-17 ENCOUNTER — Other Ambulatory Visit (HOSPITAL_BASED_OUTPATIENT_CLINIC_OR_DEPARTMENT_OTHER): Payer: Self-pay | Admitting: Family Medicine

## 2021-12-17 LAB — BASIC METABOLIC PANEL
BUN/Creatinine Ratio: 22 (ref 9–23)
BUN: 17 mg/dL (ref 6–24)
CO2: 16 mmol/L — ABNORMAL LOW (ref 20–29)
Calcium: 8.6 mg/dL — ABNORMAL LOW (ref 8.7–10.2)
Chloride: 108 mmol/L — ABNORMAL HIGH (ref 96–106)
Creatinine, Ser: 0.77 mg/dL (ref 0.57–1.00)
Glucose: 91 mg/dL (ref 70–99)
Potassium: 4.6 mmol/L (ref 3.5–5.2)
Sodium: 145 mmol/L — ABNORMAL HIGH (ref 134–144)
eGFR: 94 mL/min/{1.73_m2} (ref >59)

## 2021-12-17 LAB — CBC WITH DIFFERENTIAL/PLATELET
Basophils Absolute: 0.1 10*3/uL (ref 0.0–0.2)
Basos: 1 %
EOS (ABSOLUTE): 0.2 10*3/uL (ref 0.0–0.4)
Eos: 2 %
Hematocrit: 40.1 % (ref 34.0–46.6)
Hemoglobin: 12.9 g/dL (ref 11.1–15.9)
Immature Grans (Abs): 0 10*3/uL (ref 0.0–0.1)
Immature Granulocytes: 0 %
Lymphocytes Absolute: 2.2 10*3/uL (ref 0.7–3.1)
Lymphs: 26 %
MCH: 27.5 pg (ref 26.6–33.0)
MCHC: 32.2 g/dL (ref 31.5–35.7)
MCV: 86 fL (ref 79–97)
Monocytes Absolute: 0.6 10*3/uL (ref 0.1–0.9)
Monocytes: 8 %
Neutrophils Absolute: 5.4 10*3/uL (ref 1.4–7.0)
Neutrophils: 63 %
Platelets: 267 10*3/uL (ref 150–450)
RBC: 4.69 x10E6/uL (ref 3.77–5.28)
RDW: 13.5 % (ref 11.7–15.4)
WBC: 8.4 10*3/uL (ref 3.4–10.8)

## 2021-12-17 LAB — TSH: TSH: 0.158 u[IU]/mL — ABNORMAL LOW (ref 0.450–4.500)

## 2021-12-17 LAB — MAGNESIUM: Magnesium: 1.9 mg/dL (ref 1.6–2.3)

## 2021-12-17 MED ORDER — LEVOTHYROXINE SODIUM 13 MCG PO CAPS: 13.0000 ug | ORAL_CAPSULE | Freq: Every day | ORAL | 1 refills | Status: DC

## 2021-12-17 NOTE — Addendum Note (Signed)
Addended by: DE Peru, Marcy Salvo J on: 12/17/2021 11:29 AM ? ? Modules accepted: Orders ? ?

## 2021-12-18 NOTE — Telephone Encounter (Signed)
Moved arrival time to 8:30 am.  ?Patient verbalized agreement with read back.  ?

## 2021-12-25 NOTE — Pre-Procedure Instructions (Signed)
Instructed patient on the following items: ?Arrival time 0830 ?Nothing to eat or drink after midnight ?No meds AM of procedure ?Responsible person to drive you home and stay with you for 24 hrs ? ?Stop Metorpolol on 3/18 ?

## 2021-12-26 ENCOUNTER — Encounter (HOSPITAL_COMMUNITY)
Admission: RE | Disposition: A | Discharge status: Home / Self Care | Payer: 59 | Source: Home / Self Care | Attending: Cardiology

## 2021-12-26 ENCOUNTER — Other Ambulatory Visit: Payer: Self-pay

## 2021-12-26 ENCOUNTER — Ambulatory Visit (HOSPITAL_BASED_OUTPATIENT_CLINIC_OR_DEPARTMENT_OTHER): Payer: 59 | Admitting: General Practice

## 2021-12-26 ENCOUNTER — Encounter (HOSPITAL_COMMUNITY): Payer: Self-pay | Admitting: Cardiology

## 2021-12-26 ENCOUNTER — Ambulatory Visit (HOSPITAL_COMMUNITY)
Admission: RE | Admit: 2021-12-26 | Discharge: 2021-12-26 | Disposition: A | Discharge status: Home / Self Care | Payer: 59 | Attending: Cardiology | Admitting: Cardiology

## 2021-12-26 ENCOUNTER — Ambulatory Visit (HOSPITAL_COMMUNITY): Payer: 59 | Admitting: General Practice

## 2021-12-26 DIAGNOSIS — F419 Anxiety disorder, unspecified: Secondary | ICD-10-CM | POA: Diagnosis not present

## 2021-12-26 DIAGNOSIS — Z79890 Hormone replacement therapy (postmenopausal): Secondary | ICD-10-CM | POA: Diagnosis not present

## 2021-12-26 DIAGNOSIS — Z87891 Personal history of nicotine dependence: Secondary | ICD-10-CM | POA: Insufficient documentation

## 2021-12-26 DIAGNOSIS — I10 Essential (primary) hypertension: Secondary | ICD-10-CM

## 2021-12-26 DIAGNOSIS — E039 Hypothyroidism, unspecified: Secondary | ICD-10-CM | POA: Insufficient documentation

## 2021-12-26 DIAGNOSIS — I471 Supraventricular tachycardia: Secondary | ICD-10-CM

## 2021-12-26 DIAGNOSIS — Z79899 Other long term (current) drug therapy: Secondary | ICD-10-CM | POA: Insufficient documentation

## 2021-12-26 DIAGNOSIS — F411 Generalized anxiety disorder: Secondary | ICD-10-CM | POA: Insufficient documentation

## 2021-12-26 HISTORY — PX: SVT ABLATION: EP1225

## 2021-12-26 LAB — PREGNANCY, URINE: Preg Test, Ur: NEGATIVE

## 2021-12-26 SURGERY — SVT ABLATION
Anesthesia: Monitor Anesthesia Care

## 2021-12-26 MED ORDER — ACETAMINOPHEN 325 MG PO TABS: 650.0000 mg | ORAL_TABLET | ORAL | Status: DC | PRN

## 2021-12-26 MED ORDER — ACETAMINOPHEN 500 MG PO TABS
1000.0000 mg | ORAL_TABLET | Freq: Once | ORAL | Status: AC
  Administered 2021-12-26: 1000 mg via ORAL

## 2021-12-26 MED ORDER — BUPIVACAINE HCL (PF) 0.25 % IJ SOLN
INTRAMUSCULAR | Status: DC | PRN
  Administered 2021-12-26: 30 mL

## 2021-12-26 MED ORDER — PROPOFOL 500 MG/50ML IV EMUL
INTRAVENOUS | Status: DC | PRN
  Administered 2021-12-26: 100 ug/kg/min via INTRAVENOUS

## 2021-12-26 MED ORDER — SODIUM CHLORIDE 0.9 % IV SOLN: INTRAVENOUS | Status: DC

## 2021-12-26 MED ORDER — HEPARIN SODIUM (PORCINE) 1000 UNIT/ML IJ SOLN
INTRAMUSCULAR | Status: DC | PRN
  Administered 2021-12-26: 1000 [IU] via INTRAVENOUS

## 2021-12-26 MED ORDER — MIDAZOLAM HCL 2 MG/2ML IJ SOLN
INTRAMUSCULAR | Status: DC | PRN
  Administered 2021-12-26: 2 mg via INTRAVENOUS

## 2021-12-26 MED ORDER — SODIUM CHLORIDE 0.9 % IV SOLN: 250.0000 mL | INTRAVENOUS | Status: DC | PRN

## 2021-12-26 MED ORDER — HEPARIN (PORCINE) IN NACL 1000-0.9 UT/500ML-% IV SOLN
INTRAVENOUS | Status: DC | PRN
  Administered 2021-12-26 (×3): 500 mL

## 2021-12-26 MED ORDER — ONDANSETRON HCL 4 MG/2ML IJ SOLN
INTRAMUSCULAR | Status: DC | PRN
  Administered 2021-12-26: 4 mg via INTRAVENOUS

## 2021-12-26 MED ORDER — ONDANSETRON HCL 4 MG/2ML IJ SOLN: 4.0000 mg | Freq: Four times a day (QID) | INTRAMUSCULAR | Status: DC | PRN

## 2021-12-26 MED ORDER — SODIUM CHLORIDE 0.9% FLUSH: 3.0000 mL | INTRAVENOUS | Status: DC | PRN

## 2021-12-26 MED ORDER — DEXAMETHASONE SODIUM PHOSPHATE 10 MG/ML IJ SOLN
INTRAMUSCULAR | Status: DC | PRN
  Administered 2021-12-26: 10 mg via INTRAVENOUS

## 2021-12-26 MED ORDER — SODIUM CHLORIDE 0.9% FLUSH: 3.0000 mL | Freq: Two times a day (BID) | INTRAVENOUS | Status: DC

## 2021-12-26 MED ORDER — FENTANYL CITRATE (PF) 100 MCG/2ML IJ SOLN
INTRAMUSCULAR | Status: DC | PRN
  Administered 2021-12-26 (×4): 25 ug via INTRAVENOUS

## 2021-12-26 MED ORDER — ISOPROTERENOL HCL 0.2 MG/ML IJ SOLN
INTRAVENOUS | Status: DC | PRN
  Administered 2021-12-26: 4 ug/min via INTRAVENOUS

## 2021-12-26 SURGICAL SUPPLY — 15 items
CATH CRD2 QUAD 6FR REP (CATHETERS) ×1 IMPLANT
CATH DECANAV F CURVE (CATHETERS) ×1 IMPLANT
CATH JOSEPH QUAD ALLRED 6F REP (CATHETERS) ×1 IMPLANT
CATH SMTCH THERMOCOOL SF DF (CATHETERS) ×2 IMPLANT
CLOSURE PERCLOSE PROSTYLE (VASCULAR PRODUCTS) ×4 IMPLANT
PACK EP LATEX FREE (CUSTOM PROCEDURE TRAY) ×2
PACK EP LF (CUSTOM PROCEDURE TRAY) ×1 IMPLANT
PAD DEFIB RADIO PHYSIO CONN (PAD) ×2 IMPLANT
PATCH CARTO3 (PAD) ×1 IMPLANT
SHEATH CARTO VIZIGO SM CVD (SHEATH) ×1 IMPLANT
SHEATH PINNACLE 6F 10CM (SHEATH) ×2 IMPLANT
SHEATH PINNACLE 7F 10CM (SHEATH) ×1 IMPLANT
SHEATH PINNACLE 8F 10CM (SHEATH) ×2 IMPLANT
SHEATH PROBE COVER 6X72 (BAG) ×1 IMPLANT
TUBING SMART ABLATE COOLFLOW (TUBING) ×1 IMPLANT

## 2021-12-26 NOTE — Anesthesia Preprocedure Evaluation (Addendum)
Anesthesia Evaluation  ?Patient identified by MRN, date of birth, ID band ?Patient awake ? ? ? ?Reviewed: ?Allergy & Precautions, NPO status , Patient's Chart, lab work & pertinent test results, reviewed documented beta blocker date and time  ? ?History of Anesthesia Complications ?Negative for: history of anesthetic complications ? ?Airway ?Mallampati: II ? ?TM Distance: >3 FB ?Neck ROM: Full ? ? ? Dental ? ?(+) Poor Dentition, Missing, Chipped, Dental Advisory Given ?  ?Pulmonary ?former smoker,  ?  ?breath sounds clear to auscultation ? ? ? ? ? ? Cardiovascular ?hypertension, Pt. on medications and Pt. on home beta blockers ?(-) angina+ dysrhythmias Supra Ventricular Tachycardia  ?Rhythm:Regular Rate:Normal ? ?08/2021 ECHO: EF 50-55%. The LV has low normal function. Left ventricular diastolic parameters were normal. RVF is mildly reduced. The right ventricular size is mildly enlarged, no significant valvular abnormalities ?  ?Neuro/Psych ?Anxiety negative neurological ROS ?   ? GI/Hepatic ?Neg liver ROS, GERD  Controlled,  ?Endo/Other  ?Hypothyroidism Morbid obesity ? Renal/GU ?negative Renal ROS  ? ?  ?Musculoskeletal ? ? Abdominal ?(+) + obese,   ?Peds ? Hematology ?negative hematology ROS ?(+)   ?Anesthesia Other Findings ? ? Reproductive/Obstetrics ? ?  ? ? ? ? ? ? ? ? ? ? ? ? ? ?  ?  ? ? ? ? ? ? ? ?Anesthesia Physical ?Anesthesia Plan ? ?ASA: 3 ? ?Anesthesia Plan: MAC  ? ?Post-op Pain Management: Tylenol PO (pre-op)*  ? ?Induction:  ? ?PONV Risk Score and Plan: 2 and Ondansetron and Dexamethasone ? ?Airway Management Planned: Natural Airway and Simple Face Mask ? ?Additional Equipment: None ? ?Intra-op Plan:  ? ?Post-operative Plan:  ? ?Informed Consent: I have reviewed the patients History and Physical, chart, labs and discussed the procedure including the risks, benefits and alternatives for the proposed anesthesia with the patient or authorized representative who has  indicated his/her understanding and acceptance.  ? ? ? ?Dental advisory given ? ?Plan Discussed with: CRNA and Surgeon ? ?Anesthesia Plan Comments:   ? ? ? ? ? ?Anesthesia Quick Evaluation ? ?

## 2021-12-26 NOTE — H&P (Signed)
?Electrophysiology Office Note:   ?  ?Date:  12/26/2021  ?  ?ID:  Carla Huffman, DOB 11/05/1970, MRN [id number] ?  ?PCP:  de [place]uba, Buren Kosaymond J, MD   ?[facility] Cardiologist:  Chilton Siiffany [place], MD  ?[facility] Electrophysiologist:  Lanier PrudeAMERON T Marai Teehan, MD  ?  ?Referring MD: Sharlene Doryonte, Tessa N, PA-C  ?  ?Chief Complaint: SVT ?  ?History of Present Illness:   ?  ?Carla Huffman is a 51 y.o. female who presents for an evaluation of SVT at the request of Jari Favreessa Conte, [place]. Their medical history includes GAD, hTN, hypothyroidism. ?  ?She saw Julian Hyessa 09/25/2021 for follow up. She was admitted 08/28/2021 with SVT that resolved spontaneously. She is symptomatic when in SVT. ?She presented to the ER 10/25/2021 with dyspnea.  ?  ?She has actually had 2 episodes of SVT that required ER visits.  She has received adenosine in the past which resolved the arrhythmia.  She said her episodes started in her 5820s and have become more frequent and intense.  She feels chest pressure and shortness of breath when she is in her supraventricular tachycardia. ?  ?No changes. Presents for EP study today. ?  ?Objective  ?  ?    ?Past Medical History:  ?Diagnosis Date  ? Anxiety    ? Hypertension    ? Hypothyroidism    ?  ?  ?     ?Past Surgical History:  ?Procedure Laterality Date  ? CESAREAN SECTION      ? THYROIDECTOMY      ?  ?  ?Current Medications: ?Active Medications  ?    ?Current Meds  ?Medication Sig  ? ibuprofen (ADVIL) 600 MG tablet Take 1 tablet (600 mg total) by mouth every 6 (six) hours as needed.  ? levothyroxine (SYNTHROID) 25 MCG tablet TAKE 1 TABLET BY MOUTH EVERY DAY  ? levothyroxine (SYNTHROID) 300 MCG tablet Take 1 tablet (300 mcg total) by mouth daily before breakfast.  ? metoprolol tartrate (LOPRESSOR) 25 MG tablet Take 1 tablet (25 mg total) by mouth 2 (two) times daily.  ? MISC NATURAL PRODUCTS PO Take by mouth. Seamoss - take daily in smoothie  ? MISC NATURAL PRODUCTS PO Take by mouth. Hemp Protein Powder - Take daily  in smoothie  ? Multiple Vitamin (MULTIVITAMIN WITH MINERALS) TABS tablet Take 1 tablet by mouth daily.  ?  ?  ?  ?Allergies:   Keflex [cephalexin] and Levofloxacin  ?  ?Social History  ?  ?     ?Socioeconomic History  ? Marital status: Married  ?    Spouse name: Not on file  ? Number of children: Not on file  ? Years of education: Not on file  ? Highest education level: Not on file  ?Occupational History  ? Not on file  ?Tobacco Use  ? Smoking status: Former  ?    Types: Cigarettes  ?    Quit date: 10/06/1995  ?    Years since quitting: 26.1  ? Smokeless tobacco: Never  ?Vaping Use  ? Vaping Use: Never used  ?Substance and Sexual Activity  ? Alcohol use: Yes  ?    Comment: ocassionaly  ? Drug use: No  ? Sexual activity: Yes  ?Other Topics Concern  ? Not on file  ?Social History Narrative  ? Not on file  ?  ?Social Determinants of Health  ?  ?Financial Resource Strain: Not on file  ?Food Insecurity: Not on file  ?Transportation Needs: Not on file  ?  Physical Activity: Not on file  ?Stress: Not on file  ?Social Connections: Not on file  ?  ?  ?Family History: ?The patient's family history includes Brain cancer (age of onset: 66) in her father; Breast cancer (age of onset: 7) in an other family member; Breast cancer (age of onset: 57) in her mother. ?  ?ROS:   ?Please see the history of present illness.    ?All other systems reviewed and are negative. ?  ?EKGs/Labs/Other Studies Reviewed:   ?  ?The following studies were reviewed today: ?  ?Sep 16, 2021 Echo ?EF normal, 50-55% ?RV mildly reduced fx ?Mildly dilated RA/LA ?No significant valvular abnormalities ?40mm aorta ?  ?09/22/2021 Zio personally reviewed ?12 SVT, longest lasting 11 seconds. Review of strips suggests AT. ?Some patient triggered episodes correspond to SVT and others to sinus with rare PVC ?  ?08/28/2021 ECG ?SVT at 203 bpm. ?Narrow complex ?  ?  ?EKG:  The ekg ordered today demonstrates sinus rhythm.  No preexcitation.  Intervals are normal. ?  ?   ?Recent Labs: ?08/28/2021: ALT 22; BUN 19; Creatinine, Ser 0.98; Hemoglobin 11.9; Magnesium 1.9; Platelets 276; Potassium 4.0; Sodium 140 ?09/25/2021: TSH 33.100  ?Recent Lipid Panel ?Labs (Brief)  ?     ?   ?Component Value Date/Time  ?  CHOL 161 07/19/2017 0900  ?  TRIG 95 07/19/2017 0900  ?  HDL 42 (L) 07/19/2017 0900  ?  CHOLHDL 3.8 07/19/2017 0900  ?  LDLCALC 100 (H) 07/19/2017 0900  ?  ?  ?  ?Physical Exam:   ?  ?VS:  BP 112/86 (BP Location: Left Arm, Patient Position: Sitting, Cuff Size: Large)   Pulse 76   Ht 5\' 10"  (1.778 m)   Wt (!) 303 lb (137.4 kg)   SpO2 96%   BMI 43.48 kg/m?    ?  ?   ?Wt Readings from Last 3 Encounters:  ?11/12/21 (!) 303 lb (137.4 kg)  ?10/25/21 (!) 305 lb 1.9 oz (138.4 kg)  ?10/20/21 (!) 305 lb 3.2 oz (138.4 kg)  ?  ?  ?GEN: Well nourished, well developed in no acute distress.  Obese ?HEENT: Normal ?NECK: No JVD; No carotid bruits ?LYMPHATICS: No lymphadenopathy ?CARDIAC: RRR, no murmurs, rubs, gallops ?RESPIRATORY:  Clear to auscultation without rales, wheezing or rhonchi  ?ABDOMEN: Soft, non-tender, non-distended ?MUSCULOSKELETAL:  No edema; No deformity  ?SKIN: Warm and dry ?NEUROLOGIC:  Alert and oriented x 3 ?PSYCHIATRIC:  Normal affect  ?  ?  ?  ?  ?Assessment ?  ?  ?ASSESSMENT:   ?  ?1. Supraventricular tachycardia (HCC)   ?2. Primary hypertension   ?3. Generalized anxiety disorder   ?  ?PLAN:   ?  ?In order of problems listed above: ?  ?#SVT ?Symptomatic.  Recurrent.  Suspect she is having AVNRT although I cannot completely exclude other mechanisms of SVT including atrial tachycardia or AVRT.  I discussed all mechanisms of SVT during today's visit and treatment strategies.  Given her young age and recurrent episodes, I think she is a good candidate for EP study and possible ablation.  I did discuss the ablation procedure in detail with the patient include the risk, recovery and likelihood of success.  She would like to talk over the options with her husband and will  let 10/22/21 know how she would like to proceed. ?  ?Risk, benefits, and alternatives to EP study and radiofrequency ablation for SVT were also discussed in detail today. These risks include but are  not limited to complete heart block, stroke, bleeding, vascular damage, tamponade, perforation, and death. The patient understands these risk and wishes to proceed. ? ?Plan for EP study and possible ablation of SVT. ?   ?Sheria Lang T. Lalla Brothers, MD, [facility], [facility] ?Cardiac Electrophysiology ? ?

## 2021-12-26 NOTE — Transfer of Care (Signed)
Immediate Anesthesia Transfer of Care Note ? ?Patient: Carla Huffman ? ?Procedure(s) Performed: SVT ABLATION ? ?Patient Location: Cath Lab ? ?Anesthesia Type:MAC ? ?Level of Consciousness: awake, alert  and oriented ? ?Airway & Oxygen Therapy: Patient Spontanous Breathing ? ?Post-op Assessment: Report given to RN and Post -op Vital signs reviewed and stable ? ?Post vital signs: Reviewed and stable ? ?Last Vitals:  ?Vitals Value Taken Time  ?BP 134/66 12/26/21 1230  ?Temp 36.7 ?C 12/26/21 1229  ?Pulse 73 12/26/21 1234  ?Resp 15 12/26/21 1234  ?SpO2 95 % 12/26/21 1234  ?Vitals shown include unvalidated device data. ? ?Last Pain:  ?Vitals:  ? 12/26/21 1229  ?TempSrc: Temporal  ?PainSc: 0-No pain  ?   ? ?  ? ?Complications: No notable events documented. ?

## 2021-12-26 NOTE — Anesthesia Procedure Notes (Signed)
Procedure Name: [place] ?Date/Time: 12/26/2021 10:25 AM ?Performed by: Dorann Lodge, CRNA ?Pre-anesthesia Checklist: Patient identified, Emergency Drugs available, Suction available, Patient being monitored and Timeout performed ?Patient Re-evaluated:Patient Re-evaluated prior to induction ?Oxygen Delivery Method: Simple face mask ? ? ? ? ?

## 2021-12-26 NOTE — Anesthesia Postprocedure Evaluation (Signed)
Anesthesia Post Note ? ?Patient: Carla Huffman ? ?Procedure(s) Performed: SVT ABLATION ? ?  ? ?Patient location during evaluation: Phase II ?Anesthesia Type: MAC ?Level of consciousness: awake and alert, oriented and patient cooperative ?Pain management: pain level controlled ?Vital Signs Assessment: post-procedure vital signs reviewed and stable ?Respiratory status: spontaneous breathing, nonlabored ventilation and respiratory function stable ?Cardiovascular status: blood pressure returned to baseline and stable ?Postop Assessment: no apparent nausea or vomiting and adequate PO intake ?Anesthetic complications: no ? ? ?No notable events documented. ? ?Last Vitals:  ?Vitals:  ? 12/26/21 1300 12/26/21 1352  ?BP: 123/75 (!) 146/88  ?Pulse: 73 73  ?Resp: 15 14  ?Temp:    ?SpO2: 92% 92%  ?  ?Last Pain:  ?Vitals:  ? 12/26/21 1259  ?TempSrc: Temporal  ?PainSc:   ? ? ?  ?  ?  ?  ?  ?  ? ?Loriann Bosserman,E. Zeyna Mkrtchyan ? ? ? ? ?

## 2021-12-29 ENCOUNTER — Encounter (HOSPITAL_COMMUNITY): Payer: Self-pay | Admitting: Cardiology

## 2022-01-08 ENCOUNTER — Telehealth: Payer: Self-pay | Admitting: Cardiology

## 2022-01-08 NOTE — Telephone Encounter (Signed)
Patient called in reporting she noted her HR dropping into the 40 range today. Had a recent SVT ablation and has been doing well from that standpoint. Mildly dizzy with the lower HR. She is on metoprolol 25mg  BID currently. BPs are ok. I advised she cut the dose/pill and take 12.5mg  BID, follow her HR/BP with this dose. If rates remain low may need to stop altogether. She is reach out if this continues to be an issue. Thanked me for callback.  ?

## 2022-01-09 ENCOUNTER — Other Ambulatory Visit (HOSPITAL_BASED_OUTPATIENT_CLINIC_OR_DEPARTMENT_OTHER): Payer: Self-pay | Admitting: Family Medicine

## 2022-01-22 ENCOUNTER — Ambulatory Visit (HOSPITAL_BASED_OUTPATIENT_CLINIC_OR_DEPARTMENT_OTHER): Payer: 59 | Admitting: Cardiovascular Disease

## 2022-01-23 ENCOUNTER — Encounter: Payer: Self-pay | Admitting: Cardiology

## 2022-01-23 ENCOUNTER — Ambulatory Visit: Payer: 59 | Admitting: Cardiology

## 2022-01-23 VITALS — BP 124/100 | HR 72 | Ht 70.0 in | Wt 304.2 lb

## 2022-01-23 DIAGNOSIS — I471 Supraventricular tachycardia: Secondary | ICD-10-CM

## 2022-01-23 DIAGNOSIS — I10 Essential (primary) hypertension: Secondary | ICD-10-CM

## 2022-01-23 NOTE — Progress Notes (Signed)
?Electrophysiology Office Follow up Visit Note:   ? ?Date:  01/24/2022  ? ?ID:  Carla Huffman, DOB 26-Feb-1971, MRN [id number] ? ?PCP:  de [place], Buren Kos, MD  ?[facility] Cardiologist:  Chilton Si, MD  ?[facility] Electrophysiologist:  Lanier Prude, MD  ? ? ?Interval History:   ? ?Carla Huffman is a 51 y.o. female who presents for a follow up visit.  She underwent a successful EP study and ablation of AVNRT on December 26, 2021.  During the procedure there is evidence of dual AV nodal physiology with double echo beats observed during the EP study.  The slow pathway was modified during the procedure.  She tolerated the procedure well. ? ?Since the procedure she has done well without recurrence of her arrhythmia. ? ? ? ?  ? ?Past Medical History:  ?Diagnosis Date  ? Anxiety   ? Hypertension   ? Hypothyroidism   ? ? ?Past Surgical History:  ?Procedure Laterality Date  ? CESAREAN SECTION    ? SVT ABLATION N/A 12/26/2021  ? Procedure: SVT ABLATION;  Surgeon: Lanier Prude, MD;  [facility] INVASIVE CV LAB;  Service: Cardiovascular;  Laterality: N/A;  ? THYROIDECTOMY    ? ? ?Current Medications: ?Current Meds  ?Medication Sig  ? calcium carbonate (OS-CAL) 600 MG TABS tablet Take 600 mg by mouth daily with breakfast.  ? ibuprofen (ADVIL) 200 MG tablet Take 600 mg by mouth every 6 (six) hours as needed for mild pain.  ? levothyroxine (SYNTHROID) 25 MCG tablet TAKE 1 TABLET BY MOUTH EVERY DAY (Patient taking differently: every other day.)  ? levothyroxine (SYNTHROID) 300 MCG tablet TAKE 1 TABLET (300 MCG TOTAL) BY MOUTH DAILY BEFORE BREAKFAST.  ? metoprolol tartrate (LOPRESSOR) 25 MG tablet Take 1 tablet (25 mg total) by mouth 2 (two) times daily. (Patient taking differently: Take 25 mg by mouth 2 (two) times daily. Per patient taking 12.5 mg twice a day)  ? MISC NATURAL PRODUCTS PO Take by mouth. Seamoss - take daily in smoothie  ? MISC NATURAL PRODUCTS PO Take by mouth. Hemp Protein Powder - Take  daily in smoothie  ? Multiple Vitamin (MULTIVITAMIN WITH MINERALS) TABS tablet Take 1 tablet by mouth daily.  ? vitamin B-12 (CYANOCOBALAMIN) 1000 MCG tablet Take 1,000 mcg by mouth daily.  ?  ? ?Allergies:   Keflex [cephalexin] and Levofloxacin  ? ?Social History  ? ?Socioeconomic History  ? Marital status: Married  ?  Spouse name: Not on file  ? Number of children: Not on file  ? Years of education: Not on file  ? Highest education level: Not on file  ?Occupational History  ? Not on file  ?Tobacco Use  ? Smoking status: Former  ?  Types: Cigarettes  ?  Quit date: 10/06/1995  ?  Years since quitting: 26.3  ? Smokeless tobacco: Never  ?Vaping Use  ? Vaping Use: Never used  ?Substance and Sexual Activity  ? Alcohol use: Yes  ?  Comment: ocassionaly  ? Drug use: No  ? Sexual activity: Yes  ?Other Topics Concern  ? Not on file  ?Social History Narrative  ? Not on file  ? ?Social Determinants of Health  ? ?Financial Resource Strain: Not on file  ?Food Insecurity: Not on file  ?Transportation Needs: Not on file  ?Physical Activity: Not on file  ?Stress: Not on file  ?Social Connections: Not on file  ?  ? ?Family History: ?The patient's family history includes Brain cancer (age of  onset: 4) in her father; Breast cancer (age of onset: 20) in an other family member; Breast cancer (age of onset: 70) in her mother. ? ?ROS:   ?Please see the history of present illness.    ?All other systems reviewed and are negative. ? ?EKGs/Labs/Other Studies Reviewed:   ? ?The following studies were reviewed today: ? ? ? ? ?Recent Labs: ?08/28/2021: ALT 22 ?12/16/2021: BUN 17; Creatinine, Ser 0.77; Hemoglobin 12.9; Magnesium 1.9; Platelets 267; Potassium 4.6; Sodium 145; TSH 0.158  ?Recent Lipid Panel ?   ?Component Value Date/Time  ? CHOL 161 07/19/2017 0900  ? TRIG 95 07/19/2017 0900  ? HDL 42 (L) 07/19/2017 0900  ? CHOLHDL 3.8 07/19/2017 0900  ? LDLCALC 100 (H) 07/19/2017 0900  ? ? ?Physical Exam:   ? ?VS:  BP (!) 124/100 Comment: Right arm  130/100 @ 3:44 pm  Pulse 72   Ht 5\' 10"  (1.778 m)   Wt (!) 304 lb 3.2 oz (138 kg)   SpO2 95%   BMI 43.65 kg/m?    ? ?Wt Readings from Last 3 Encounters:  ?01/23/22 (!) 304 lb 3.2 oz (138 kg)  ?12/26/21 299 lb (135.6 kg)  ?12/16/21 (!) 301 lb 14.4 oz (136.9 kg)  ?  ? ?GEN:  Well nourished, well developed in no acute distress.  Obese ?HEENT: Normal ?NECK: No JVD; No carotid bruits ?LYMPHATICS: No lymphadenopathy ?CARDIAC: RRR, no murmurs, rubs, gallops ?RESPIRATORY:  Clear to auscultation without rales, wheezing or rhonchi  ?ABDOMEN: Soft, non-tender, non-distended ?MUSCULOSKELETAL:  No edema; No deformity  ?SKIN: Warm and dry ?NEUROLOGIC:  Alert and oriented x 3 ?PSYCHIATRIC:  Normal affect  ? ? ? ?  ? ?ASSESSMENT:   ? ?1. Supraventricular tachycardia (HCC)   ?2. Primary hypertension   ? ?PLAN:   ? ?In order of problems listed above: ? ? ?#SVT ?Dual AV nodal physiology with double echo beats during EP study in March.  Now post successful modification of the slow pathway during that procedure.  No recurrence of arrhythmia.  She is doing well. ? ?#Hypertension ?Controlled.  Continue current medication regimen.  I have recommended that she check her blood pressures 1-2 times per week while at home and record these values.  She should bring these recordings to her primary care physician. ? ?Follow-up with EP on an as-needed basis. ? ? ? ?Medication Adjustments/Labs and Tests Ordered: ?Current medicines are reviewed at length with the patient today.  Concerns regarding medicines are outlined above.  ?No orders of the defined types were placed in this encounter. ? ?No orders of the defined types were placed in this encounter. ? ? ? ?Signed, ?April, MD, [facility], [facility] ?01/24/2022 9:40 AM    ?Electrophysiology ?[facility] ?

## 2022-01-23 NOTE — Patient Instructions (Signed)
Medication Instructions:  ?Stop Metoprolol  Tartrate ?Start Metoprolol succinate 25 mg daily at bedtime  ?Your physician recommends that you continue on your current medications as directed. Please refer to the Current Medication list given to you today. ?*If you need a refill on your cardiac medications before your next appointment, please call your pharmacy* ? ?Lab Work: ?None. ?If you have labs (blood work) drawn today and your tests are completely normal, you will receive your results only by: ?MyChart Message (if you have MyChart) OR ?A paper copy in the mail ?If you have any lab test that is abnormal or we need to change your treatment, we will call you to review the results. ? ?Testing/Procedures: ?None. ? ?Follow-Up: ?At [facility], you and your health needs are our priority.  As part of our continuing mission to provide you with exceptional heart care, we have created designated Provider Care Teams.  These Care Teams include your primary Cardiologist (physician) and Advanced Practice Providers (APPs -  Physician Assistants and Nurse Practitioners) who all work together to provide you with the care you need, when you need it. ? ?Your physician wants you to follow-up in: As needed with Steffanie Dunn, MD  ? ? ?We recommend signing up for the patient portal called "MyChart".  Sign up information is provided on this After Visit Summary.  MyChart is used to connect with patients for Virtual Visits (Telemedicine).  Patients are able to view lab/test results, encounter notes, upcoming appointments, etc.  Non-urgent messages can be sent to your provider as well.   ?To learn more about what you can do with MyChart, go to [email or web address].   ? ?Any Other Special Instructions Will Be Listed Below (If Applicable). ? ? ? ? ?  ? ? ?

## 2022-01-29 ENCOUNTER — Telehealth: Payer: Self-pay | Admitting: Cardiology

## 2022-01-29 MED ORDER — METOPROLOL SUCCINATE ER 25 MG PO TB24: 25.0000 mg | ORAL_TABLET | Freq: Every day | ORAL | 3 refills | Status: DC

## 2022-01-29 NOTE — Addendum Note (Signed)
Addended by: Darrell Jewel on: 01/29/2022 12:13 PM ? ? Modules accepted: Orders ? ?

## 2022-01-29 NOTE — Telephone Encounter (Signed)
Left detailed message okay per DPR.  ?

## 2022-01-29 NOTE — Telephone Encounter (Signed)
Pt c/o medication issue: ? ?1. Name of Medication: Start Metoprolol succinate 25 mg  ? ?2. How are you currently taking this medication (dosage and times per day)?   ? ?3. Are you having a reaction (difficulty breathing--STAT)? no ? ?4. What is your medication issue? Patient is supposed to start his medication. But Prescription haven't been written for the patient. Please advise  ? ?

## 2022-02-05 ENCOUNTER — Ambulatory Visit (INDEPENDENT_AMBULATORY_CARE_PROVIDER_SITE_OTHER): Payer: 59 | Admitting: Family Medicine

## 2022-02-05 ENCOUNTER — Encounter (HOSPITAL_BASED_OUTPATIENT_CLINIC_OR_DEPARTMENT_OTHER): Payer: Self-pay | Admitting: Family Medicine

## 2022-02-05 VITALS — BP 129/81 | HR 75 | Temp 98.1°F | Ht 70.0 in | Wt 300.3 lb

## 2022-02-05 DIAGNOSIS — R112 Nausea with vomiting, unspecified: Secondary | ICD-10-CM | POA: Diagnosis not present

## 2022-02-05 DIAGNOSIS — E039 Hypothyroidism, unspecified: Secondary | ICD-10-CM

## 2022-02-05 MED ORDER — ONDANSETRON 8 MG PO TBDP: 8.0000 mg | ORAL_TABLET | Freq: Three times a day (TID) | ORAL | 3 refills | Status: DC | PRN

## 2022-02-05 NOTE — Assessment & Plan Note (Addendum)
About 1 week ago, she started to have dry cough, next day fatigue. Had some subsequent nausea and chills. Some vomiting over the weekend, multiple episodes. This resolved after a couple days. Has had some continued nausea, tolerating fluids, has been scared to eat due to recent vomiting. No abdominal. No fevers.  ?On exam, patient is in no acute distress, vital signs normal.  Cardiovascular exam with regular rate and rhythm, abdomen is soft, nontender, nondistended.  Normal bowel sounds. ?Most likely underlying cause is related to possible infectious etiology which I would suspect is gradually resolving for patient ?Can proceed with initial laboratory evaluation to ensure electrolyte derangement or kidney dysfunction ?Would expect gradual symptoms over the coming days.  Stressed importance of maintaining adequate hydration and gradual increase of solids into the diet beginning with bland foods.  Discussed that if she has any persistent vomiting which impairs ability to stay hydrated or any worsening symptoms, would proceed with evaluation in the emergency department ?

## 2022-02-05 NOTE — Progress Notes (Signed)
? ? ?  Procedures performed today:   ? ?None. ? ?Independent interpretation of notes and tests performed by another provider:  ? ?None. ? ?Brief History, Exam, Impression, and Recommendations:   ? ?BP 129/81   Pulse 75   Temp 98.1 ?F (36.7 ?C)   Ht 5\' 10"  (1.778 m)   Wt (!) 300 lb 4.8 oz (136.2 kg)   SpO2 97%   BMI 43.09 kg/m?  ? ?Nausea and vomiting ?About 1 week ago, she started to have dry cough, next day fatigue. Had some subsequent nausea and chills. Some vomiting over the weekend, multiple episodes. This resolved after a couple days. Has had some continued nausea, tolerating fluids, has been scared to eat due to recent vomiting. No abdominal. No fevers.  ?On exam, patient is in no acute distress, vital signs normal.  Cardiovascular exam with regular rate and rhythm, abdomen is soft, nontender, nondistended.  Normal bowel sounds. ?Most likely underlying cause is related to possible infectious etiology which I would suspect is gradually resolving for patient ?Can proceed with initial laboratory evaluation to ensure electrolyte derangement or kidney dysfunction ?Would expect gradual symptoms over the coming days.  Stressed importance of maintaining adequate hydration and gradual increase of solids into the diet beginning with bland foods.  Discussed that if she has any persistent vomiting which impairs ability to stay hydrated or any worsening symptoms, would proceed with evaluation in the emergency department ? ?Hypothyroidism ?Has been taking levothyroxine as prescribed with alternating days of 300 mcg and 325 mcg ?Last TSH was slightly outside of normal range, will recheck today to assess current status.  Further medication adjustments pending results of labs ? ?Plan for follow-up at previously scheduled appointment ? ? ?___________________________________________ ?Tatyana Biber de [place], MD, ABFM, CAQSM ?Primary Care and Sports Medicine ?[place] ?

## 2022-02-05 NOTE — Patient Instructions (Signed)
?  Medication Instructions:  ?Your physician recommends that you continue on your current medications as directed. Please refer to the Current Medication list given to you today. ?--If you need a refill on any your medications before your next appointment, please call your pharmacy first. If no refills are authorized on file call the office.-- ?Lab Work: ?Your physician has recommended that you have lab work today: Yes ?If you have labs (blood work) drawn today and your tests are completely normal, you will receive your results via MyChart message OR a phone call from our staff.  ?Please ensure you check your voicemail in the event that you authorized detailed messages to be left on a delegated number. If you have any lab test that is abnormal or we need to change your treatment, we will call you to review the results. ? ?Referrals/Procedures/Imaging: ?NO ? ?Follow-Up: ?Your next appointment:   ?Your physician recommends that you schedule a follow-up appointment with Dr. de [place]. ? ?You will receive a text message or e-mail with a link to a survey about your care and experience with [place] today! We would greatly appreciate your feedback!  ? ?Thanks for letting us be apart of your health journey!!  ?Primary Care and Sports Medicine  ? ?Dr. Marcy Salvo de [place]  ? ?We encourage you to activate your patient portal called "MyChart".  Sign up information is provided on this After Visit Summary.  MyChart is used to connect with patients for Virtual Visits (Telemedicine).  Patients are able to view lab/test results, encounter notes, upcoming appointments, etc.  Non-urgent messages can be sent to your provider as well. To learn more about what you can do with MyChart, please visit --  [email or web address].    ?

## 2022-02-06 LAB — BASIC METABOLIC PANEL
BUN/Creatinine Ratio: 19 (ref 9–23)
BUN: 14 mg/dL (ref 6–24)
CO2: 22 mmol/L (ref 20–29)
Calcium: 8.4 mg/dL — ABNORMAL LOW (ref 8.7–10.2)
Chloride: 103 mmol/L (ref 96–106)
Creatinine, Ser: 0.72 mg/dL (ref 0.57–1.00)
Glucose: 92 mg/dL (ref 70–99)
Potassium: 4.3 mmol/L (ref 3.5–5.2)
Sodium: 142 mmol/L (ref 134–144)
eGFR: 101 mL/min/{1.73_m2} (ref >59)

## 2022-02-06 LAB — TSH: TSH: 3.66 u[IU]/mL (ref 0.450–4.500)

## 2022-02-06 NOTE — Assessment & Plan Note (Signed)
Has been taking levothyroxine as prescribed with alternating days of 300 mcg and 325 mcg ?Last TSH was slightly outside of normal range, will recheck today to assess current status.  Further medication adjustments pending results of labs ?

## 2022-02-14 ENCOUNTER — Other Ambulatory Visit (HOSPITAL_BASED_OUTPATIENT_CLINIC_OR_DEPARTMENT_OTHER): Payer: Self-pay | Admitting: Family Medicine

## 2022-02-16 ENCOUNTER — Ambulatory Visit (HOSPITAL_BASED_OUTPATIENT_CLINIC_OR_DEPARTMENT_OTHER): Payer: 59 | Admitting: Family Medicine

## 2022-02-17 ENCOUNTER — Telehealth (HOSPITAL_BASED_OUTPATIENT_CLINIC_OR_DEPARTMENT_OTHER): Payer: Self-pay | Admitting: Family Medicine

## 2022-02-17 NOTE — Telephone Encounter (Signed)
LVM for pt to reschedule missed f/u with PCP. ?

## 2022-02-25 ENCOUNTER — Encounter (HOSPITAL_BASED_OUTPATIENT_CLINIC_OR_DEPARTMENT_OTHER): Payer: Self-pay | Admitting: Family Medicine

## 2022-02-25 ENCOUNTER — Ambulatory Visit (INDEPENDENT_AMBULATORY_CARE_PROVIDER_SITE_OTHER): Payer: 59 | Admitting: Family Medicine

## 2022-02-25 VITALS — BP 130/88 | HR 85 | Ht 71.0 in | Wt 300.0 lb

## 2022-02-25 DIAGNOSIS — I10 Essential (primary) hypertension: Secondary | ICD-10-CM

## 2022-02-25 DIAGNOSIS — R42 Dizziness and giddiness: Secondary | ICD-10-CM | POA: Diagnosis not present

## 2022-02-25 NOTE — Patient Instructions (Signed)
  Medication Instructions:  Your physician recommends that you continue on your current medications as directed. Please refer to the Current Medication list given to you today. --If you need a refill on any your medications before your next appointment, please call your pharmacy first. If no refills are authorized on file call the office.-- Lab Work: Your physician has recommended that you have lab work today: No If you have labs (blood work) drawn today and your tests are completely normal, you will receive your results via [place] a phone call from our staff.  Please ensure you check your voicemail in the event that you authorized detailed messages to be left on a delegated number. If you have any lab test that is abnormal or we need to change your treatment, we will call you to review the results.  Referrals/Procedures/Imaging: No  Follow-Up: Your next appointment:   Your physician recommends that you schedule a follow-up appointment in 4 weeks for bp recheck and dizziness  with Dr. de [place].  You will receive a text message or e-mail with a link to a survey about your care and experience with [place] today! We would greatly appreciate your feedback!   Thanks for letting us be apart of your health journey!!  Primary Care and Sports Medicine   Dr. Arlina Robes [place]   We encourage you to activate your patient portal called "MyChart".  Sign up information is provided on this After Visit Summary.  MyChart is used to connect with patients for Virtual Visits (Telemedicine).  Patients are able to view lab/test results, encounter notes, upcoming appointments, etc.  Non-urgent messages can be sent to your provider as well. To learn more about what you can do with MyChart, please visit --  [email or web address].

## 2022-02-25 NOTE — Assessment & Plan Note (Signed)
Patient reports that yesterday she began to have some intermittent dizziness.  Denies experiencing any other associated symptoms, has not had any numbness or tingling, no weakness, no significant balance issues.  Symptoms not necessarily worsened by anything in particular, does recall that if she was looking up for a prolonged period of time she felt that she maybe had a low bit more dizziness.  Otherwise has not noticed worsening with rolling over in bed or turning her head specifically.  Has not had any blurry vision or other visual disturbances.  No headache associated with symptoms. She did have procedure a couple months ago for SVT ablation, has not had any recurrence of symptoms since that time. Labs earlier this month showed normal TSH as well as normal electrolytes and kidney function, calcium is slightly low, however has been low on prior labs as well. On exam, extraocular movements are intact, pupils are equal, round and reactive to light and accommodation.  Cardiovascular exam with regular rate and rhythm, no murmur appreciated.  Auscultation of carotid arteries bilaterally without any bruits.  Lungs clear to auscultation bilaterally.  Dix-Hallpike does produce recurrence of symptoms, no nystagmus noted with testing. Given history and exam, feel that cardiac etiology is less likely as is acute neurologic issue such as stroke.  Feel the symptoms are most likely attributable to BPPV.  Discussed diagnosis, expected prognosis, potential treatment considerations.  She would like to continue to monitor symptoms, has had some improvement since yesterday.  If continuing to have issues, would consider referral to physical therapy for vestibular rehab. Blood pressure was initially elevated in the office today, repeat was closer to typical for patient.  We will have her continue to monitor blood pressure at home and we will plan for close follow-up in about 3 to 4 weeks to continue monitoring blood  pressure Did advise warning signs related to her dizziness and potential symptom progression which would increase concern for more serious process and warrant presentation to emergency department

## 2022-02-25 NOTE — Assessment & Plan Note (Signed)
Blood pressure initially elevated in office today, repeat blood pressure was closer to typical for patient We will have her continue to monitor blood pressure at home, plan for close follow-up in about 3 to 4 weeks for continued monitoring

## 2022-02-25 NOTE — Progress Notes (Signed)
    Procedures performed today:    None.  Independent interpretation of notes and tests performed by another provider:   None.  Brief History, Exam, Impression, and Recommendations:    BP 130/88   Pulse 85   Ht 5\' 11"  (1.803 m)   Wt 300 lb (136.1 kg)   SpO2 98%   BMI 41.84 kg/m   Dizziness Patient reports that yesterday she began to have some intermittent dizziness.  Denies experiencing any other associated symptoms, has not had any numbness or tingling, no weakness, no significant balance issues.  Symptoms not necessarily worsened by anything in particular, does recall that if she was looking up for a prolonged period of time she felt that she maybe had a low bit more dizziness.  Otherwise has not noticed worsening with rolling over in bed or turning her head specifically.  Has not had any blurry vision or other visual disturbances.  No headache associated with symptoms. She did have procedure a couple months ago for SVT ablation, has not had any recurrence of symptoms since that time. Labs earlier this month showed normal TSH as well as normal electrolytes and kidney function, calcium is slightly low, however has been low on prior labs as well. On exam, extraocular movements are intact, pupils are equal, round and reactive to light and accommodation.  Cardiovascular exam with regular rate and rhythm, no murmur appreciated.  Auscultation of carotid arteries bilaterally without any bruits.  Lungs clear to auscultation bilaterally.  Dix-Hallpike does produce recurrence of symptoms, no nystagmus noted with testing. Given history and exam, feel that cardiac etiology is less likely as is acute neurologic issue such as stroke.  Feel the symptoms are most likely attributable to BPPV.  Discussed diagnosis, expected prognosis, potential treatment considerations.  She would like to continue to monitor symptoms, has had some improvement since yesterday.  If continuing to have issues, would consider  referral to physical therapy for vestibular rehab. Blood pressure was initially elevated in the office today, repeat was closer to typical for patient.  We will have her continue to monitor blood pressure at home and we will plan for close follow-up in about 3 to 4 weeks to continue monitoring blood pressure Did advise warning signs related to her dizziness and potential symptom progression which would increase concern for more serious process and warrant presentation to emergency department  Hypertension Blood pressure initially elevated in office today, repeat blood pressure was closer to typical for patient We will have her continue to monitor blood pressure at home, plan for close follow-up in about 3 to 4 weeks for continued monitoring  Return in about 4 weeks (around 03/25/2022) for HTN.   ___________________________________________ Arturo Freundlich de [place], MD, ABFM, [facility] and [place]

## 2022-03-03 ENCOUNTER — Encounter (HOSPITAL_BASED_OUTPATIENT_CLINIC_OR_DEPARTMENT_OTHER): Payer: Self-pay | Admitting: Family Medicine

## 2022-03-25 ENCOUNTER — Ambulatory Visit (HOSPITAL_BASED_OUTPATIENT_CLINIC_OR_DEPARTMENT_OTHER): Payer: 59 | Admitting: Family Medicine

## 2022-04-08 ENCOUNTER — Encounter: Payer: Self-pay | Admitting: Cardiology

## 2022-04-09 ENCOUNTER — Telehealth (HOSPITAL_BASED_OUTPATIENT_CLINIC_OR_DEPARTMENT_OTHER): Payer: Self-pay | Admitting: Cardiovascular Disease

## 2022-04-09 NOTE — Telephone Encounter (Signed)
Patient has aching around her groin where the incision was and starting to have tenderness from her chest to her shoulder. Please advise  [facility] patient appt with Dan Humphreys on 7/11

## 2022-04-09 NOTE — Telephone Encounter (Signed)
Returned call to patient. Patient c/o pain in her right groin at her procedure insertion site from her SVT ablation back in 12/26/21. She states that it is an aching pain. She denies and bruising, swelling, discoloration, temperature changes in her extremity, or any other Sx. Instructed patient to continue to monitor and follow up with PCP if needed.

## 2022-04-09 NOTE — Telephone Encounter (Signed)
Patient sees Dr Lalla Brothers at [facility] office, [place] message sent to him yesterday No follow up with Ronn Melena NP scheduled  Will forward to [facility] office triage

## 2022-04-22 ENCOUNTER — Other Ambulatory Visit (HOSPITAL_BASED_OUTPATIENT_CLINIC_OR_DEPARTMENT_OTHER): Payer: Self-pay | Admitting: Family Medicine

## 2022-04-28 ENCOUNTER — Encounter (HOSPITAL_BASED_OUTPATIENT_CLINIC_OR_DEPARTMENT_OTHER): Payer: Self-pay | Admitting: Family Medicine

## 2022-05-03 ENCOUNTER — Ambulatory Visit (HOSPITAL_COMMUNITY)
Admission: EM | Admit: 2022-05-03 | Discharge: 2022-05-03 | Disposition: A | Discharge status: Home / Self Care | Payer: 59 | Attending: Physician Assistant | Admitting: Physician Assistant

## 2022-05-03 ENCOUNTER — Ambulatory Visit (INDEPENDENT_AMBULATORY_CARE_PROVIDER_SITE_OTHER): Payer: 59

## 2022-05-03 ENCOUNTER — Encounter (HOSPITAL_COMMUNITY): Payer: Self-pay

## 2022-05-03 DIAGNOSIS — M62838 Other muscle spasm: Secondary | ICD-10-CM | POA: Diagnosis not present

## 2022-05-03 DIAGNOSIS — M542 Cervicalgia: Secondary | ICD-10-CM

## 2022-05-03 MED ORDER — METHYLPREDNISOLONE SODIUM SUCC 125 MG IJ SOLR
INTRAMUSCULAR | Status: AC
  Filled 2022-05-03: qty 2

## 2022-05-03 MED ORDER — METHYLPREDNISOLONE ACETATE 80 MG/ML IJ SUSP
60.0000 mg | Freq: Once | INTRAMUSCULAR | Status: AC
  Administered 2022-05-03: 60 mg via INTRAMUSCULAR

## 2022-05-03 MED ORDER — METHOCARBAMOL 500 MG PO TABS: 500.0000 mg | ORAL_TABLET | Freq: Two times a day (BID) | ORAL | 0 refills | Status: DC

## 2022-05-03 NOTE — ED Provider Notes (Signed)
[facility]    CSN: [id number] Arrival date & time: 05/03/22  1533      History   Chief Complaint Chief Complaint  Patient presents with   Spasms    HPI Carla Huffman is a 51 y.o. female.   Patient presents today with a several day history of neck pain that radiates into bilateral trapezius but worse on the right.  She denies any known injury or increase in activity prior to symptom onset.  Reports that pain is currently 3/4 on a 0-10 pain scale but has been more severe, described as aching, worse with movement or palpation, no alleviating factors identified.  She denies any previous injury or surgery involving her neck.  Denies history of malignancy.  She denies any numbness or paresthesias involving her arms/hands, does report some tingling sensation along her left trapezius.  She denies any upper extremity weakness.  She has tried ibuprofen without improvement of symptoms.  Reports a similar episode many years ago that resolved with steroid injection.  She is requesting this if appropriate today.  She denies any history of diabetes.  She did have to leave work as a result of symptoms.    Past Medical History:  Diagnosis Date   Anxiety    Hypertension    Hypothyroidism     Patient Active Problem List   Diagnosis Date Noted   Dizziness 02/25/2022   Nausea and vomiting 02/05/2022   Hypoparathyroidism (HCC) 12/16/2021   Hypocalcemia 10/20/2021   Hypertension 10/20/2021   Cardiac ischemia 08/28/2021   Supraventricular tachycardia (HCC) 08/28/2021   Hypothyroidism 06/30/2017   Generalized anxiety disorder 06/30/2017    Past Surgical History:  Procedure Laterality Date   CESAREAN SECTION     SVT ABLATION N/A 12/26/2021   Procedure: SVT ABLATION;  Surgeon: Lanier Prude, MD;  Location: [facility] INVASIVE CV LAB;  Service: Cardiovascular;  Laterality: N/A;   THYROIDECTOMY      OB History   No obstetric history on file.      Home Medications    Prior to  Admission medications   Medication Sig Start Date End Date Taking? Authorizing Provider  methocarbamol (ROBAXIN) 500 MG tablet Take 1 tablet (500 mg total) by mouth 2 (two) times daily. 05/03/22  Yes Lyda Colcord K, PA-C  calcium carbonate (OS-CAL) 600 MG TABS tablet Take 600 mg by mouth daily with breakfast.    [provider]  ibuprofen (ADVIL) 200 MG tablet Take 600 mg by mouth every 6 (six) hours as needed for mild pain.    [provider]  levothyroxine (SYNTHROID) 25 MCG tablet Take 1 tablet (25 mcg total) by mouth every other day. 02/17/22   de [place], Raymond J, MD  levothyroxine (SYNTHROID) 300 MCG tablet TAKE 1 TABLET (300 MCG TOTAL) BY MOUTH DAILY BEFORE BREAKFAST. 04/23/22   de [place], Buren Kos, MD  Levothyroxine Sodium 13 MCG CAPS TAKE 1 CAPSULE (13 MCG TOTAL) BY MOUTH DAILY BEFORE BREAKFAST. 12/17/21   de [place], Buren Kos, MD  metoprolol succinate (TOPROL XL) 25 MG 24 hr tablet Take 1 tablet (25 mg total) by mouth at bedtime. 01/29/22   Lanier Prude, MD  MISC NATURAL PRODUCTS PO Take by mouth. Seamoss - take daily in smoothie    [provider]  MISC NATURAL PRODUCTS PO Take by mouth. Hemp Protein Powder - Take daily in smoothie    [provider]  Multiple Vitamin (MULTIVITAMIN WITH MINERALS) TABS tablet Take 1 tablet by mouth daily.  [provider]  ondansetron (ZOFRAN-ODT) 8 MG disintegrating tablet Take 1 tablet (8 mg total) by mouth every 8 (eight) hours as needed for nausea. 02/05/22   de [place], Raymond J, MD  vitamin B-12 (CYANOCOBALAMIN) 1000 MCG tablet Take 1,000 mcg by mouth daily.    [provider]    Family History Family History  Problem Relation Age of Onset   Breast cancer Mother 17   Brain cancer Father 57   Breast cancer Other 72       Half sister--same father    Social History Social History   Tobacco Use   Smoking status: Former    Types: Cigarettes    Quit date: 10/06/1995    Years since quitting: 26.5    Smokeless tobacco: Never  Vaping Use   Vaping Use: Never used  Substance Use Topics   Alcohol use: Yes    Comment: ocassionaly   Drug use: No     Allergies   Keflex [cephalexin] and Levofloxacin   Review of Systems Review of Systems  Constitutional:  Positive for activity change. Negative for appetite change, fatigue and fever.  Musculoskeletal:  Positive for neck pain. Negative for arthralgias and myalgias.  Neurological:  Negative for dizziness, weakness, light-headedness, numbness and headaches.     Physical Exam Triage Vital Signs ED Triage Vitals [05/03/22 1633]  Enc Vitals Group     BP 138/86     Pulse Rate 81     Resp 18     Temp 98.7 F (37.1 C)     Temp Source Oral     SpO2 98 %     Weight      Height      Head Circumference      Peak Flow      Pain Score      Pain Loc      Pain Edu?      Excl. in GC?    No data found.  Updated Vital Signs BP 138/86 (BP Location: Left Arm)   Pulse 81   Temp 98.7 F (37.1 C) (Oral)   Resp 18   SpO2 98%   Visual Acuity Right Eye Distance:   Left Eye Distance:   Bilateral Distance:    Right Eye Near:   Left Eye Near:    Bilateral Near:     Physical Exam Vitals reviewed.  Constitutional:      General: She is awake. She is not in acute distress.    Appearance: Normal appearance. She is well-developed. She is not ill-appearing.     Comments: Very pleasant female appears stated age in no acute distress sitting comfortably in exam room  HENT:     Head: Normocephalic and atraumatic.  Cardiovascular:     Rate and Rhythm: Normal rate and regular rhythm.     Heart sounds: Normal heart sounds, S1 normal and S2 normal. No murmur heard. Pulmonary:     Effort: Pulmonary effort is normal.     Breath sounds: Normal breath sounds. No wheezing, rhonchi or rales.     Comments: Clear to auscultation bilaterally Musculoskeletal:     Cervical back: Spasms, tenderness and bony tenderness present. No crepitus. Pain with  movement present.     Thoracic back: No tenderness or bony tenderness.     Lumbar back: No tenderness or bony tenderness.     Comments: Pain percussion of cervical vertebrae.  No deformity or step-off noted.  Decreased range of motion with rotation secondary to pain.  Tenderness palpation along cervical paraspinal muscles and trapezius.  Spasm noted along right trapezius.  Psychiatric:        Behavior: Behavior is cooperative.      UC Treatments / Results  Labs (all labs ordered are listed, but only abnormal results are displayed) Labs Reviewed - No data to display  EKG   Radiology DG Cervical Spine Complete  Result Date: 05/03/2022 CLINICAL DATA:  Pain and swelling. Spasms on the right side of the neck. EXAM: CERVICAL SPINE - COMPLETE 4+ VIEW COMPARISON:  CT 03/08/2019 FINDINGS: Straightening of usual cervical lordosis without change since prior study. No anterior subluxations. Changes may be due to patient positioning or could indicate degenerative change or muscle spasm. No vertebral compression deformities. No focal bone lesion or bone destruction. Degenerative changes with narrowed interspaces and endplate osteophyte formation at C5-6 and C6-7 levels. Normal alignment of the facet joints. C1-2 articulation appears intact. No prevertebral soft tissue swelling. Surgical clips at the lower neck. IMPRESSION: Nonspecific straightening of usual cervical lordosis. Mild degenerative changes. No acute displaced fractures identified. Electronically Signed   By: Burman Nieves M.D.   On: 05/03/2022 17:37    Procedures Procedures (including critical care time)  Medications Ordered in UC Medications  methylPREDNISolone acetate (DEPO-MEDROL) injection 60 mg (60 mg Intramuscular Given 05/03/22 1707)    Initial Impression / Assessment and Plan / UC Course  I have reviewed the triage vital signs and the nursing notes.  Pertinent labs & imaging results that were available during my care of the  patient were reviewed by me and considered in my medical decision making (see chart for details).     X-ray obtained given pain on percussion of vertebrae showed no acute fractures or dislocations.  Straightening of cervical lordosis consistent with muscle spasm.  Patient was given injection of Depo-Medrol.  We will start Robaxin twice daily for up to 10 days.  Discussed that this is sedating and she should not drive drink alcohol while taking it.  She can use Tylenol for breakthrough pain.  Discussed that if symptoms or not improving she should follow-up with a sports medicine provider was given contact information for local provider with instruction to call to schedule an appointment.  Encouraged her to use heat, stretch, rest for symptom relief.  Discussed that if she has any worsening symptoms including increased pain, fever, headache, weakness, numbness, paresthesias she needs to be seen emergently.  Strict return precautions given.  Work excuse note provided.  Final Clinical Impressions(s) / UC Diagnoses   Final diagnoses:  Neck pain  Muscle spasm     Discharge Instructions      There was no fracture or dislocation on your x-ray.  It did show some straightening of your neck which is likely related to muscle spasms.  We gave an injection of steroids today.  Continue Tylenol and topical medication as needed for additional pain relief.  I have called in a muscle relaxer.  This make you sleepy so do not drive or drink alcohol with taking it.  If your symptoms are improving I recommend you follow-up with sports medicine; call to schedule an appointment.  If anything worsens and you have fever, headache, increased pain, numbness or tingling in your hands, weakness you need to be seen immediately.     ED Prescriptions     Medication Sig Dispense Auth. Provider   methocarbamol (ROBAXIN) 500 MG tablet Take 1 tablet (500 mg total) by mouth 2 (two) times daily. 20 tablet  Agnieszka Newhouse, Noberto Retort, PA-C       PDMP not reviewed this encounter.   Jeani Hawking, PA-C 05/03/22 1745

## 2022-05-03 NOTE — ED Triage Notes (Signed)
Pt reports muscle spasm that started on her right side down her right arm. C/o neck pain.

## 2022-05-03 NOTE — Discharge Instructions (Signed)
There was no fracture or dislocation on your x-ray.  It did show some straightening of your neck which is likely related to muscle spasms.  We gave an injection of steroids today.  Continue Tylenol and topical medication as needed for additional pain relief.  I have called in a muscle relaxer.  This make you sleepy so do not drive or drink alcohol with taking it.  If your symptoms are improving I recommend you follow-up with sports medicine; call to schedule an appointment.  If anything worsens and you have fever, headache, increased pain, numbness or tingling in your hands, weakness you need to be seen immediately.

## 2022-06-04 IMAGING — CT CT HEART MORP W/ CTA COR W/ SCORE W/ CA W/CM &/OR W/O CM
4 of 7 series · 8 of 20 positions shown, 9 images · IV contrast (APPLIED)
Comparison: None.
COMPARISON: None.

Addendum:
EXAM:
OVER-READ INTERPRETATION  CT CHEST

The following report is an over-read performed by radiologist Dr.
Yoslin Andia [[email or web address]] on 08/29/2021. This
over-read does not include interpretation of cardiac or coronary
anatomy or pathology. The coronary CTA interpretation by the
cardiologist is attached.
CLINICAL DATA: This is a 50 year old female with elevated troponin.
Cardiac/Coronary  CTA
TECHNIQUE: The patient was scanned on a [employer] Force scanner.

[Series 6: best diast · axial · 0.41mm/px · z∈[+1287,+1331]mm · 2 of 333 slices shown, 3 images]
[im 111/333  vessel]
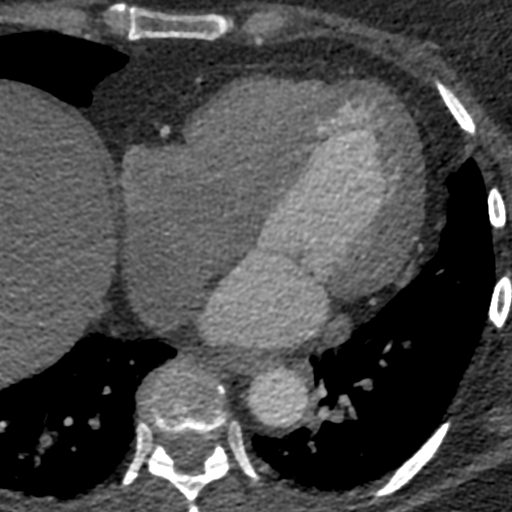
[im 111/333  lung]
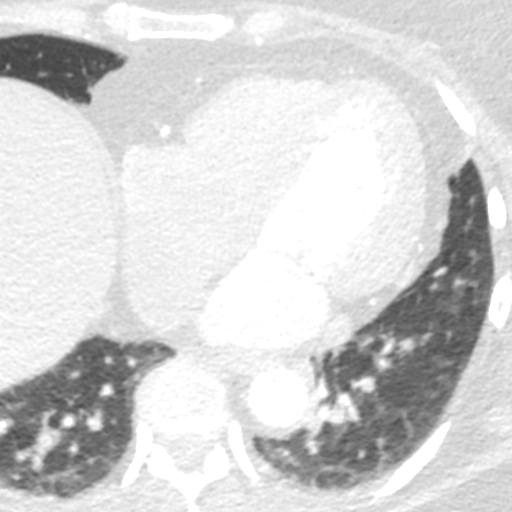
[im 222/333  vessel]
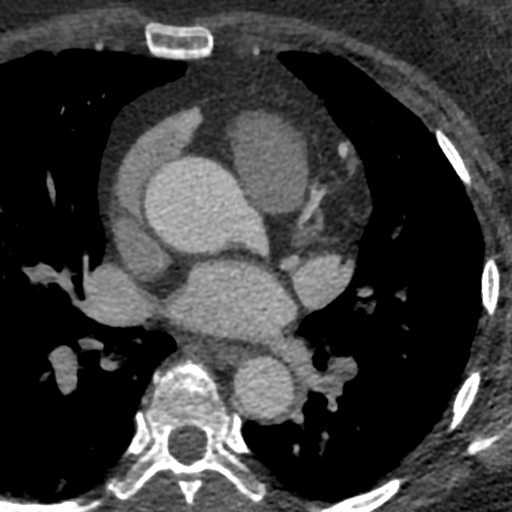

[Series 7: ts syst sharp · axial · 0.41mm/px · z∈[+1287,+1331]mm · 2 of 333 slices shown]
[im 111/333  lung]
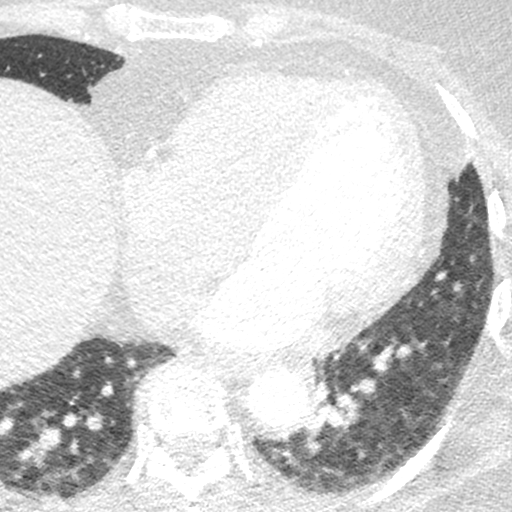
[im 222/333  lung]
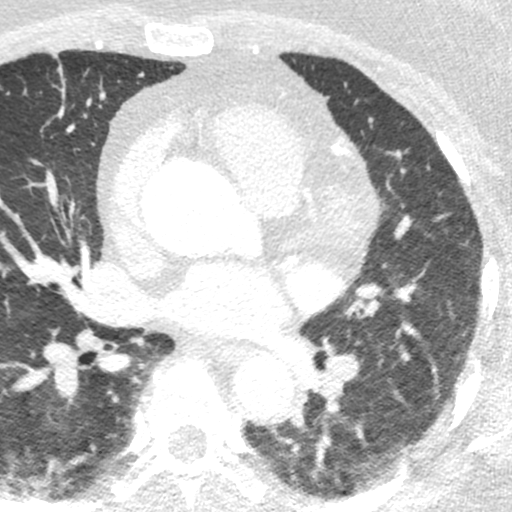

[Series 8: best syst · axial · 0.41mm/px · z∈[+1287,+1331]mm · 2 of 333 slices shown]
[im 111/333  vessel]
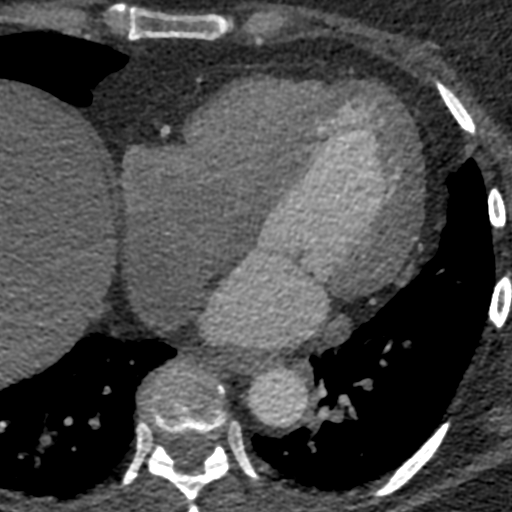
[im 222/333  vessel]
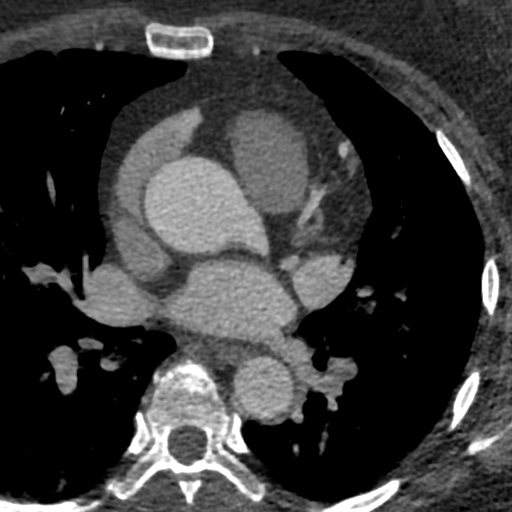

[Series 9: ts diast sharp 77 % · axial · 0.41mm/px · z∈[+1287,+1331]mm · 2 of 333 slices shown]
[im 111/333  lung]
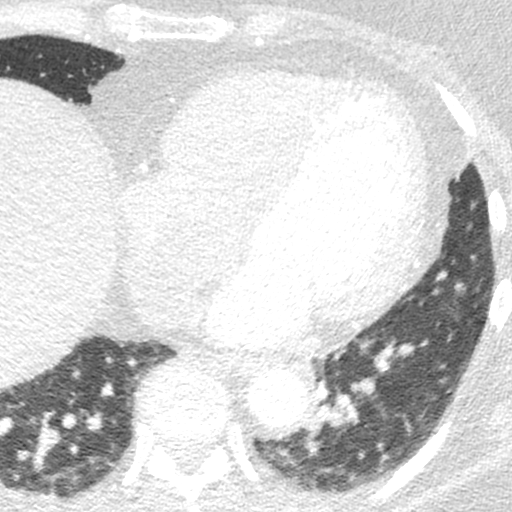
[im 222/333  lung]
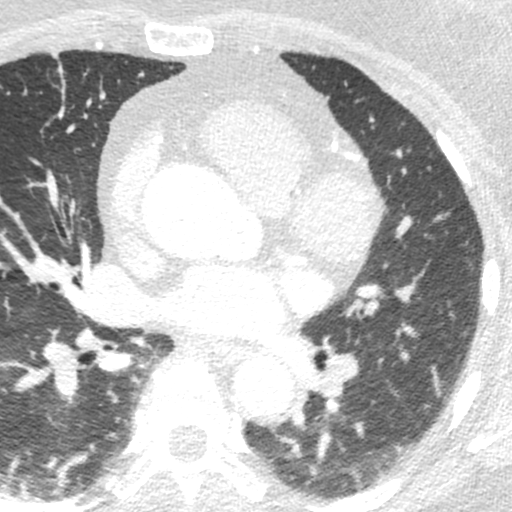

[8 of 20 positions shown; findings below may reference images not displayed]

FINDINGS: Vascular: Fusiform ectasia of the ascending aorta measuring up to 42
mm. No additional significant extra cardiac vascular abnormality.

Mediastinum/Nodes: No enlarged mediastinal or axillary lymph nodes.
The trachea and esophagus demonstrate no significant findings.

Lungs/Pleura: No focal consolidation, pleural effusion, or
pneumothorax.

Upper Abdomen: No acute abnormality.

Musculoskeletal: No chest wall mass or suspicious bone lesions
identified.
IMPRESSION: Fusiform aneurysmal dilation of the visualized ascending thoracic
aorta measuring up to 4.2 cm. Recommend annual imaging followup by
CTA or MRA. This recommendation follows 2282
ACCF/AHA/AATS/ACR/ASA/SCA/MACKLAIN/ROSALIA/PAULA/APOLLINAIRE Guidelines for the
Diagnosis and Management of Patients with Thoracic Aortic Disease.
Circulation. 2282; 121: E266-e369. Aortic aneurysm NOS (7M4P5-JL4.C)
FINDINGS: A 100 kV prospective scan was triggered in the descending thoracic
aorta at 111 HU's. Axial non-contrast 3 mm slices were carried out
through the heart. The data set was analyzed on a dedicated work
station and scored using the Agatson method. Gantry rotation speed
was 250 msecs and collimation was .6 mm. No beta blockade and 0.8 mg
of sl NTG was given. The 3D data set was reconstructed in 5%
intervals of the 67-82 % of the R-R cycle. Diastolic phases were
analyzed on a dedicated work station using MPR, MIP and VRT modes.
The patient received 80 cc of contrast.

Image quality: Contrast opacification not optimal but the is good
visibility of the coronary arteries. Evidence of respiratory motion
artifact.

Aorta: The proximal ascending aorta is dilated (41.4 mm). No
calcifications. No dissection.

Aortic Valve:  Trileaflet.  No calcifications.

Coronary Arteries:  Normal coronary origin.  Right dominance.

RCA is a large dominant artery that gives rise to PDA and PLA. There
is no plaque.

Left main is a large artery that gives rise to LAD and LCX arteries.

LAD is a large vessel that has no plaque.

LCX is a non-dominant artery that gives rise to one large OM1
branch. There is no plaque.

Coronary Calcium Score:

Left main: 0

Left anterior descending artery: 0

Left circumflex artery: 0

Right coronary artery: 0

Total: 0

Percentile: 0

Other findings:

Normal pulmonary vein drainage into the left atrium.

Normal left atrial appendage without a thrombus.

Normal size of the pulmonary artery.
IMPRESSION: 1. Coronary calcium score of 0. This was 0 percentile for age and
sex matched control.

2. Normal coronary origin with right dominance.

3. No evidence of CAD. CAD-RADS 0. No evidence of CAD (0%). Consider
non-atherosclerotic causes of chest pain.

4. Proximal ascending aorta dilatation.

*** End of Addendum ***
EXAM:
OVER-READ INTERPRETATION  CT CHEST

The following report is an over-read performed by radiologist Dr.
Yoslin Andia [REDACTED] on 08/29/2021. This
over-read does not include interpretation of cardiac or coronary
anatomy or pathology. The coronary CTA interpretation by the
cardiologist is attached.
FINDINGS: Vascular: Fusiform ectasia of the ascending aorta measuring up to 42
mm. No additional significant extra cardiac vascular abnormality.

Mediastinum/Nodes: No enlarged mediastinal or axillary lymph nodes.
The trachea and esophagus demonstrate no significant findings.

Lungs/Pleura: No focal consolidation, pleural effusion, or
pneumothorax.

Upper Abdomen: No acute abnormality.

Musculoskeletal: No chest wall mass or suspicious bone lesions
identified.
IMPRESSION: Fusiform aneurysmal dilation of the visualized ascending thoracic
aorta measuring up to 4.2 cm. Recommend annual imaging followup by
CTA or MRA. This recommendation follows 2282
ACCF/AHA/AATS/ACR/ASA/SCA/MACKLAIN/ROSALIA/PAULA/APOLLINAIRE Guidelines for the
Diagnosis and Management of Patients with Thoracic Aortic Disease.
Circulation. 2282; 121: [id number]. Aortic aneurysm NOS (7M4P5-JL4.C)

## 2022-06-20 ENCOUNTER — Other Ambulatory Visit (HOSPITAL_BASED_OUTPATIENT_CLINIC_OR_DEPARTMENT_OTHER): Payer: Self-pay | Admitting: Family Medicine

## 2022-06-25 ENCOUNTER — Other Ambulatory Visit (HOSPITAL_BASED_OUTPATIENT_CLINIC_OR_DEPARTMENT_OTHER): Payer: Self-pay | Admitting: Family Medicine

## 2022-07-27 ENCOUNTER — Emergency Department (HOSPITAL_BASED_OUTPATIENT_CLINIC_OR_DEPARTMENT_OTHER)
Admission: EM | Admit: 2022-07-27 | Discharge: 2022-07-27 | Discharge status: Against Medical Advice | Payer: 59 | Attending: Emergency Medicine | Admitting: Emergency Medicine

## 2022-07-27 ENCOUNTER — Other Ambulatory Visit: Payer: Self-pay

## 2022-07-27 ENCOUNTER — Ambulatory Visit (INDEPENDENT_AMBULATORY_CARE_PROVIDER_SITE_OTHER): Payer: 59 | Admitting: Family Medicine

## 2022-07-27 ENCOUNTER — Encounter (HOSPITAL_BASED_OUTPATIENT_CLINIC_OR_DEPARTMENT_OTHER): Payer: Self-pay | Admitting: *Deleted

## 2022-07-27 DIAGNOSIS — J029 Acute pharyngitis, unspecified: Secondary | ICD-10-CM | POA: Diagnosis not present

## 2022-07-27 DIAGNOSIS — R221 Localized swelling, mass and lump, neck: Secondary | ICD-10-CM | POA: Diagnosis present

## 2022-07-27 DIAGNOSIS — Z5321 Procedure and treatment not carried out due to patient leaving prior to being seen by health care provider: Secondary | ICD-10-CM | POA: Diagnosis not present

## 2022-07-27 LAB — CBC
HCT: 40 % (ref 36.0–46.0)
Hemoglobin: 12.9 g/dL (ref 12.0–15.0)
MCH: 28.6 pg (ref 26.0–34.0)
MCHC: 32.3 g/dL (ref 30.0–36.0)
MCV: 88.7 fL (ref 80.0–100.0)
Platelets: 218 10*3/uL (ref 150–400)
RBC: 4.51 MIL/uL (ref 3.87–5.11)
RDW: 14.6 % (ref 11.5–15.5)
WBC: 19.8 10*3/uL — ABNORMAL HIGH (ref 4.0–10.5)
nRBC: 0 % (ref 0.0–0.2)

## 2022-07-27 LAB — BASIC METABOLIC PANEL
Anion gap: 11 (ref 5–15)
BUN: 14 mg/dL (ref 6–20)
CO2: 24 mmol/L (ref 22–32)
Calcium: 8 mg/dL — ABNORMAL LOW (ref 8.9–10.3)
Chloride: 99 mmol/L (ref 98–111)
Creatinine, Ser: 0.7 mg/dL (ref 0.44–1.00)
GFR, Estimated: >60 mL/min (ref >60)
Glucose, Bld: 94 mg/dL (ref 70–99)
Potassium: 3.4 mmol/L — ABNORMAL LOW (ref 3.5–5.1)
Sodium: 134 mmol/L — ABNORMAL LOW (ref 135–145)

## 2022-07-27 LAB — POCT RAPID STREP A (OFFICE): Rapid Strep A Screen: NEGATIVE

## 2022-07-27 LAB — PREGNANCY, URINE: Preg Test, Ur: NEGATIVE

## 2022-07-27 NOTE — Progress Notes (Signed)
Virtual Visit via Telephone   I connected with  Carla Huffman  on 07/27/22 by telephone/telehealth and verified that I am speaking with the correct person using two identifiers.  I discussed the limitations, risks, security and privacy concerns of performing an evaluation and management service by telephone, including the higher likelihood of inaccurate diagnosis and treatment, and the availability of in person appointments.  We also discussed the likely need of an additional face to face encounter for complete and high quality delivery of care.  I also discussed with the patient that there may be a patient responsible charge related to this service. The patient expressed understanding and wishes to proceed.  Provider location is in medical facility. Patient location is at their home, different from provider location. People involved in care of the patient during this telehealth encounter were myself, my nurse/medical assistant, and my front office/scheduling team member.  Review of Systems: No fevers, chills, night sweats, weight loss, chest pain, or shortness of breath.   Objective Findings:    General: Speaking full sentences, no audible heavy breathing.  Sounds alert and appropriately interactive.    Independent interpretation of tests performed by another provider:   None.  Brief History, Exam, Impression, and Recommendations:    Sore throat Patient presents for evaluation of sore throat.  She indicates that symptoms initially began on Friday with mild ear pain and the next day she began to have fever, sore throat.  She also began to have some nausea and vomiting.  Fever and vomiting both resolved yesterday afternoon.  She has been tolerating p.o. intake, primarily water.  Has not had fever in the least 24 hours.  She has primarily been utilizing Tylenol to help with controlling symptoms.  She is not aware of any sick contacts.  She does continue to have a sore throat and feels  that she has a lump in the back of her throat.  Has not noticed any discrete swelling when looking in her throat, not aware of any tonsillar exudate.  She indicates that her sore throat feels similar to strep throat, however she has not had this many years.  She has had a mild cough with current symptoms.  No bowel movement changes.  She has not felt any significant shortness of breath when completing daily activities. During virtual visit, patient did not have any audible increased work of breathing, but she was able to speak in complete sentences. Discussed with patient that symptoms are most likely related to viral etiology and would less likely be related to strep throat, however did discuss that it is difficult to completely assess current symptoms due to virtual nature of appointment.  Because of virtual nature, cannot directly look at tympanic membrane's, inspect throat or listen to heart and lungs today. Discussed options with patient, recommend initial conservative measures to help with current symptoms and would initially manage as acute viral illness.  Additionally, she may come by the office to have point-of-care testing done to evaluate for potential strep throat.  She will plan to come to the office today to have this testing completed and if positive, we will treat accordingly  Patient to come by the office to have strep swab completed, however this came back negative.  She was examined at that time and noted to have discrete area over right side of neck that was firm, tender to palpation, no obvious fluctuance.  Given recent fevers, location of possible abscess, recommended evaluation emergency department downstairs.  I discussed  the above assessment and treatment plan with the patient. The patient was provided an opportunity to ask questions and all were answered. The patient agreed with the plan and demonstrated an understanding of the instructions.  The patient was advised to call back or  seek an in-person evaluation if the symptoms worsen or if the condition fails to improve as anticipated.  I provided 31 minutes of face to face and non-face-to-face time during this encounter date, time was needed to gather information, review chart, records, communicate/coordinate with staff remotely, as well as complete documentation.   ___________________________________________ Carla Whitmire de [place], MD, ABFM, CAQSM [facility] [place]

## 2022-07-27 NOTE — ED Notes (Signed)
Triage BP did not cross over, pt called back to recheck VS

## 2022-07-27 NOTE — ED Triage Notes (Addendum)
Pt has what she calls a "lump" (lymphadenopathy) on right and has had sore throat.  Pt had negative strep sceen with PCP and was advised to come here for evaluation of swelling on right side of neck. Pt noted this first this am and sore throat x2 days with fever and emesis.  Her MD wanted to have her evaluated to ensure this is not an abscess.  No fever today.

## 2022-07-27 NOTE — Assessment & Plan Note (Signed)
Patient presents for evaluation of sore throat.  She indicates that symptoms initially began on Friday with mild ear pain and the next day she began to have fever, sore throat.  She also began to have some nausea and vomiting.  Fever and vomiting both resolved yesterday afternoon.  She has been tolerating p.o. intake, primarily water.  Has not had fever in the least 24 hours.  She has primarily been utilizing Tylenol to help with controlling symptoms.  She is not aware of any sick contacts.  She does continue to have a sore throat and feels that she has a lump in the back of her throat.  Has not noticed any discrete swelling when looking in her throat, not aware of any tonsillar exudate.  She indicates that her sore throat feels similar to strep throat, however she has not had this many years.  She has had a mild cough with current symptoms.  No bowel movement changes.  She has not felt any significant shortness of breath when completing daily activities. During virtual visit, patient did not have any audible increased work of breathing, but she was able to speak in complete sentences. Discussed with patient that symptoms are most likely related to viral etiology and would less likely be related to strep throat, however did discuss that it is difficult to completely assess current symptoms due to virtual nature of appointment.  Because of virtual nature, cannot directly look at tympanic membrane's, inspect throat or listen to heart and lungs today. Discussed options with patient, recommend initial conservative measures to help with current symptoms and would initially manage as acute viral illness.  Additionally, she may come by the office to have point-of-care testing done to evaluate for potential strep throat.  She will plan to come to the office today to have this testing completed and if positive, we will treat accordingly

## 2022-07-27 NOTE — Patient Instructions (Signed)
  Medication Instructions:  Your physician recommends that you continue on your current medications as directed. Please refer to the Current Medication list given to you today. --If you need a refill on any your medications before your next appointment, please call your pharmacy first. If no refills are authorized on file call the office.-- Lab Work: Your physician has recommended that you have lab work today:Yes Throat Swab If you have labs (blood work) drawn today and your tests are completely normal, you will receive your results via [place] a phone call from our staff.  Please ensure you check your voicemail in the event that you authorized detailed messages to be left on a delegated number. If you have any lab test that is abnormal or we need to change your treatment, we will call you to review the results.  Referrals/Procedures/Imaging: N/A  Follow-Up: Your next appointment:   Your physician recommends that you schedule a follow-up appointment in: PRN with Dr. de [place]  You will receive a text message or e-mail with a link to a survey about your care and experience with [place] today! We would greatly appreciate your feedback!   Thanks for letting us be apart of your health journey!!  Primary Care and Sports Medicine   Dr. Arlina Robes [place]   We encourage you to activate your patient portal called "MyChart".  Sign up information is provided on this After Visit Summary.  MyChart is used to connect with patients for Virtual Visits (Telemedicine).  Patients are able to view lab/test results, encounter notes, upcoming appointments, etc.  Non-urgent messages can be sent to your provider as well. To learn more about what you can do with MyChart, please visit --  [email or web address].

## 2022-07-31 IMAGING — DX DG CHEST 2V
2 series · 2 of 2 positions shown · non-contrast
Comparison: 08/28/2021

CLINICAL DATA: Shortness of breath

EXAM:
CHEST - 2 VIEW

[chest pa]
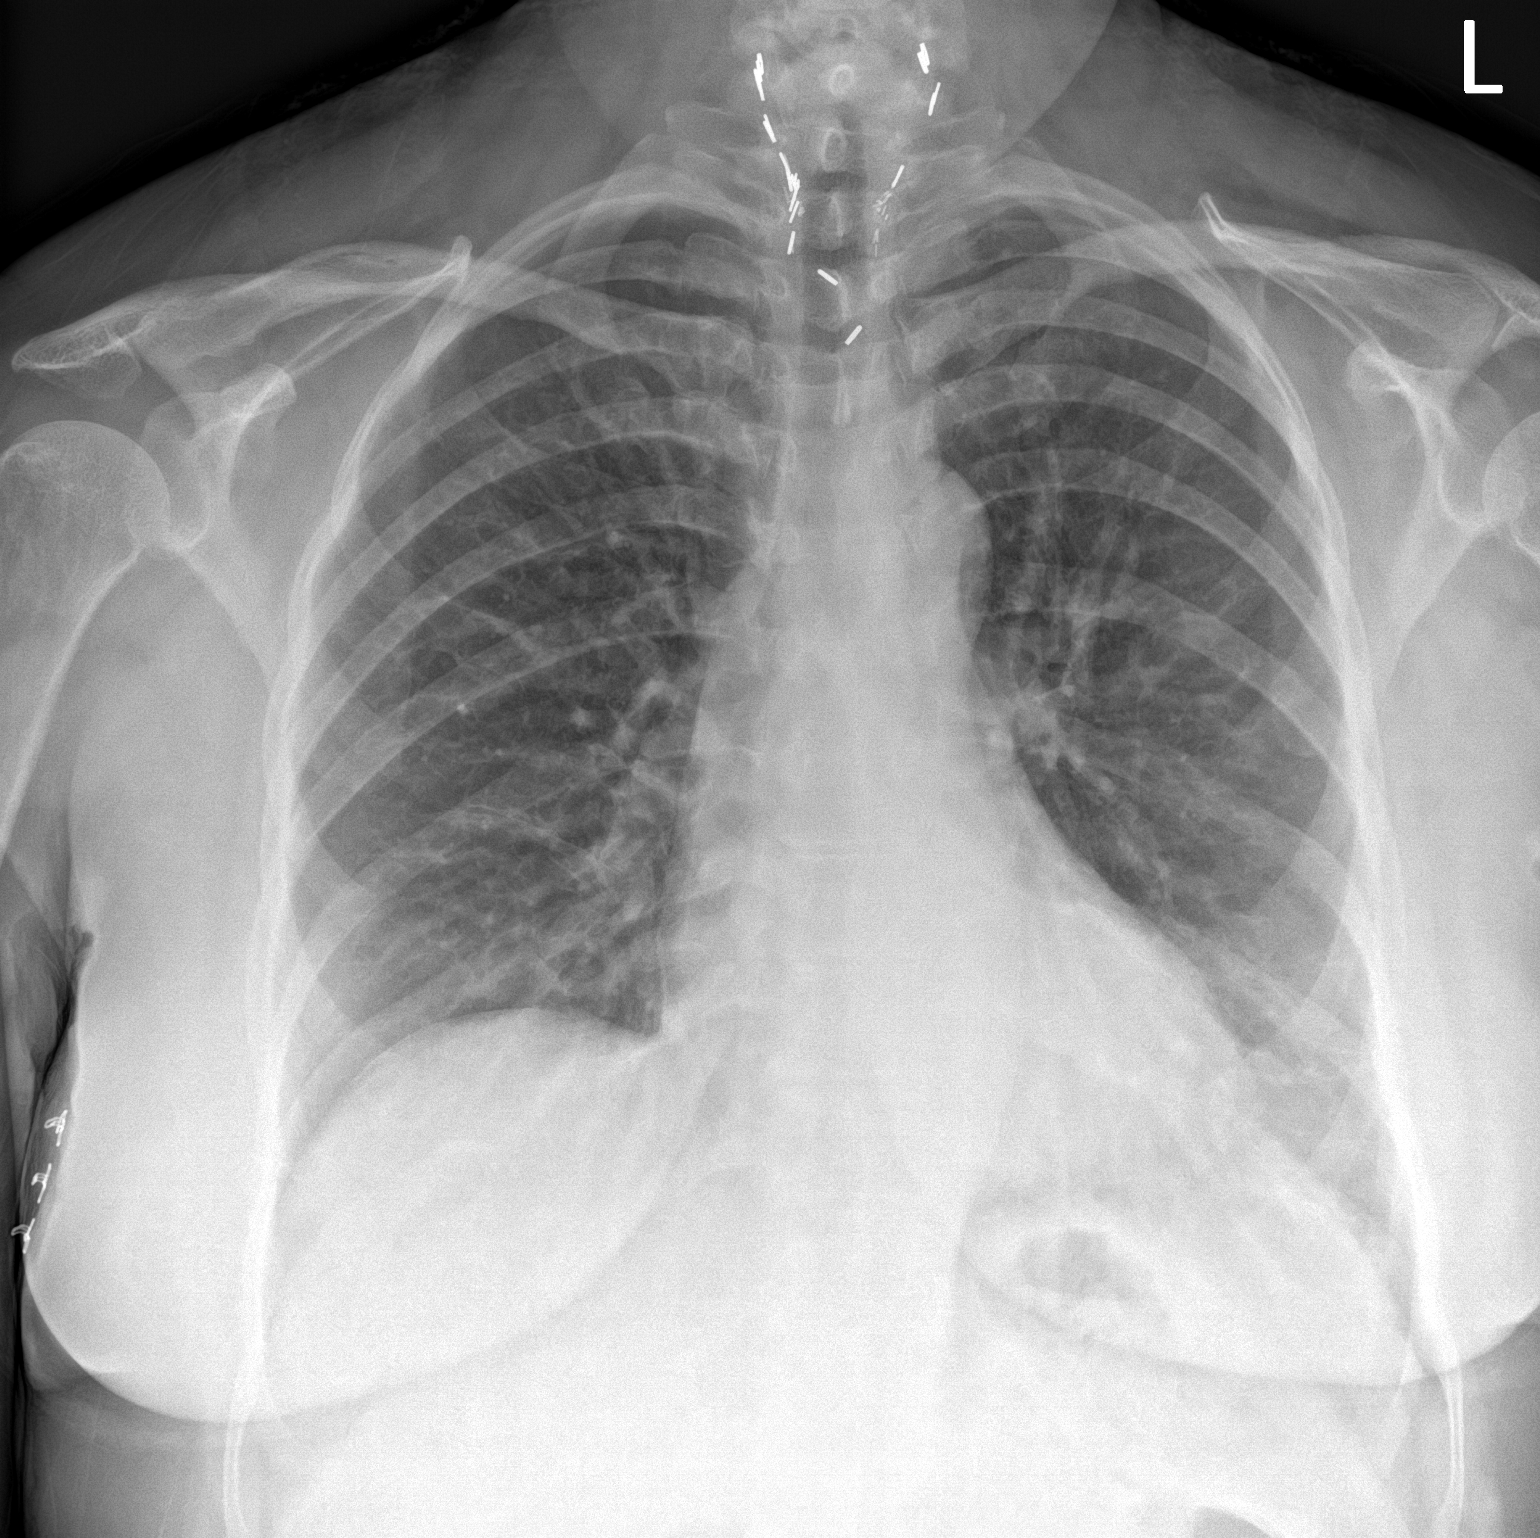

[chest lat]
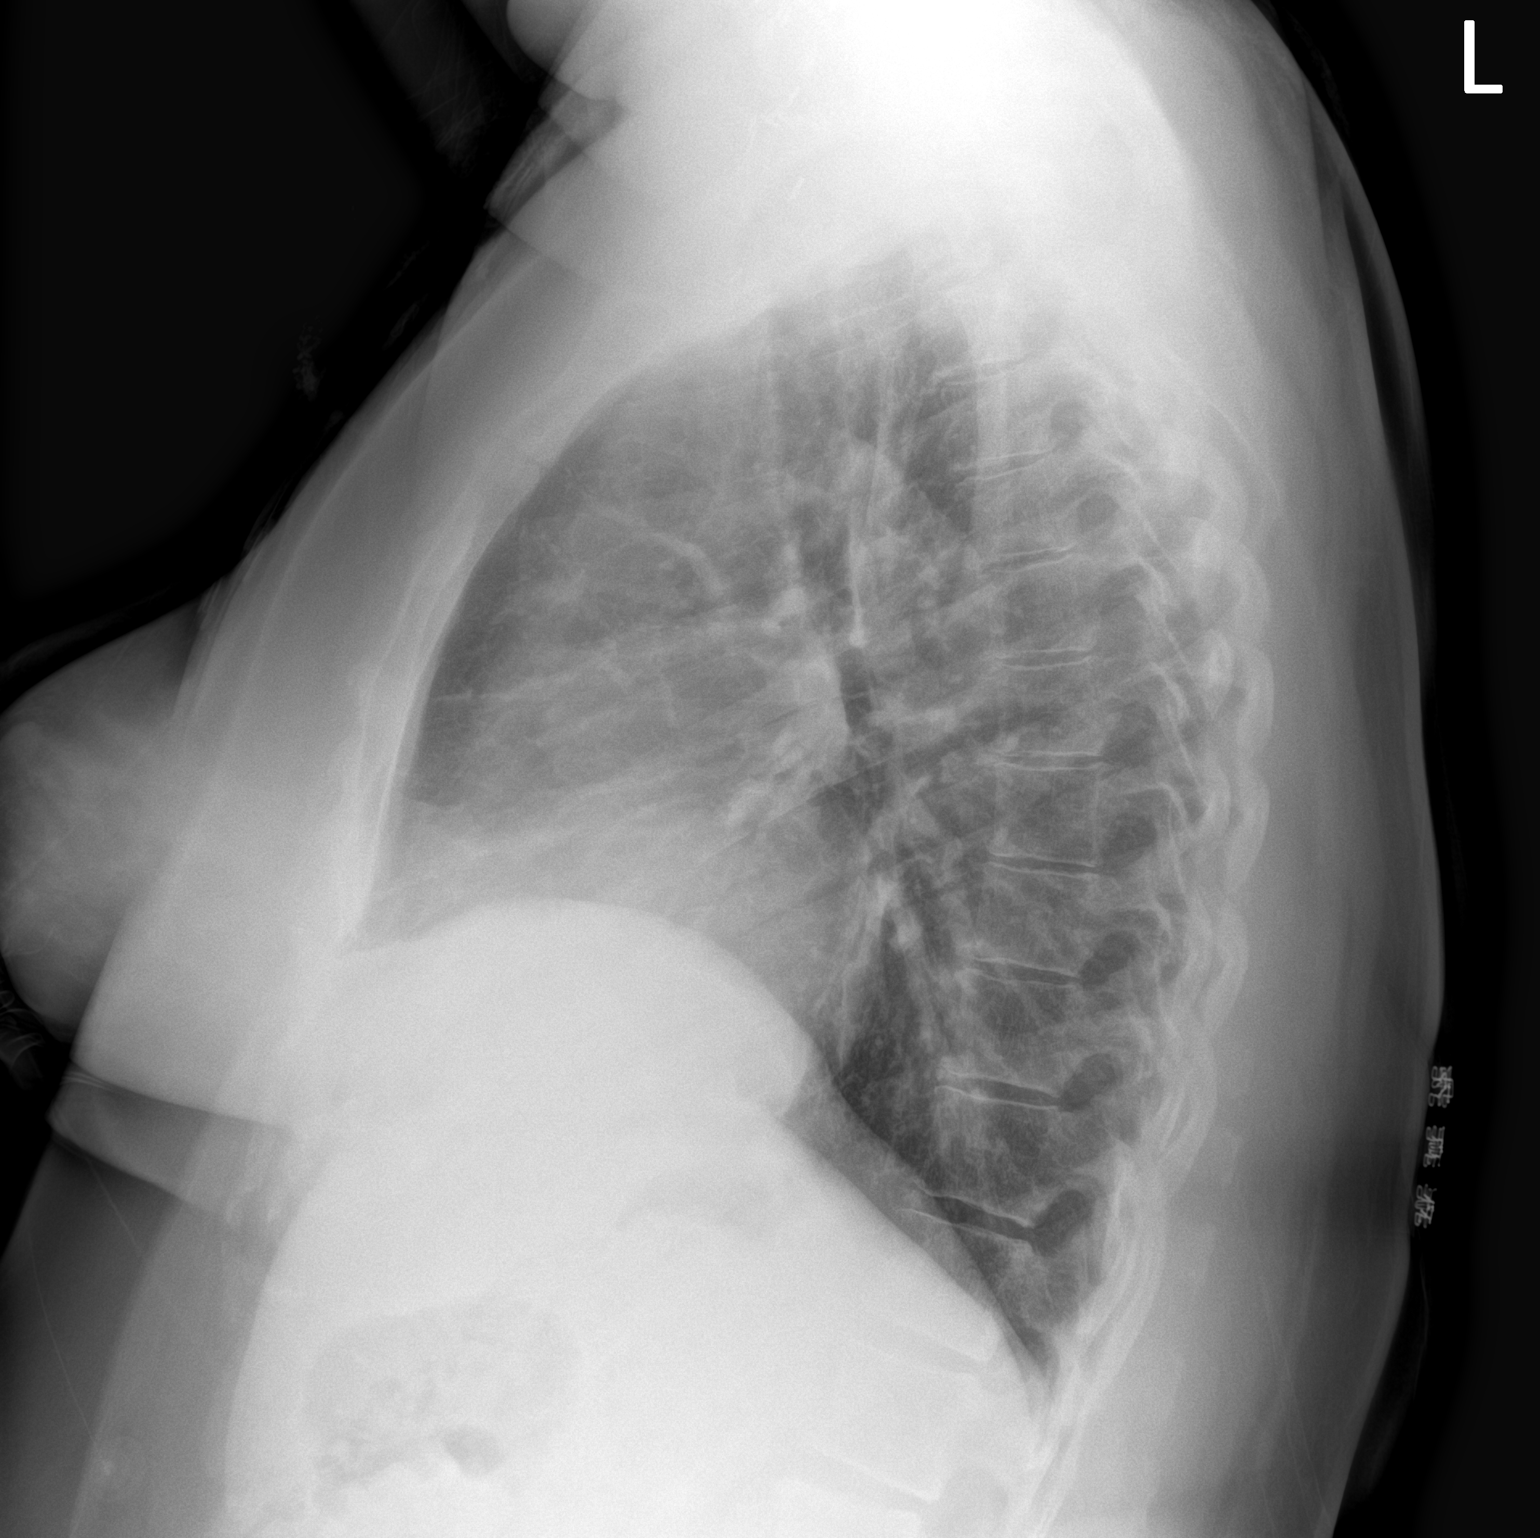

[2 of 2 positions shown; findings below may reference images not displayed]

FINDINGS: The heart size and mediastinal contours are within normal limits.
Surgical clips about the lower neck, likely related to prior
thyroidectomy. Pulmonary vascular prominence. The visualized
skeletal structures are unremarkable.
IMPRESSION: Pulmonary vascular prominence without overt pulmonary edema. No
focal airspace opacity.

## 2022-08-21 ENCOUNTER — Encounter (HOSPITAL_BASED_OUTPATIENT_CLINIC_OR_DEPARTMENT_OTHER): Payer: Self-pay | Admitting: Emergency Medicine

## 2022-08-21 ENCOUNTER — Emergency Department (HOSPITAL_BASED_OUTPATIENT_CLINIC_OR_DEPARTMENT_OTHER): Payer: 59

## 2022-08-21 ENCOUNTER — Other Ambulatory Visit: Payer: Self-pay

## 2022-08-21 ENCOUNTER — Emergency Department (HOSPITAL_BASED_OUTPATIENT_CLINIC_OR_DEPARTMENT_OTHER)
Admission: EM | Admit: 2022-08-21 | Discharge: 2022-08-21 | Disposition: A | Discharge status: Home / Self Care | Payer: 59 | Attending: Emergency Medicine | Admitting: Emergency Medicine

## 2022-08-21 ENCOUNTER — Telehealth: Payer: Self-pay | Admitting: Cardiovascular Disease

## 2022-08-21 DIAGNOSIS — R0789 Other chest pain: Secondary | ICD-10-CM | POA: Diagnosis not present

## 2022-08-21 DIAGNOSIS — R079 Chest pain, unspecified: Secondary | ICD-10-CM

## 2022-08-21 DIAGNOSIS — D72829 Elevated white blood cell count, unspecified: Secondary | ICD-10-CM | POA: Diagnosis not present

## 2022-08-21 LAB — BASIC METABOLIC PANEL
Anion gap: 10 (ref 5–15)
BUN: 14 mg/dL (ref 6–20)
CO2: 26 mmol/L (ref 22–32)
Calcium: 8.5 mg/dL — ABNORMAL LOW (ref 8.9–10.3)
Chloride: 101 mmol/L (ref 98–111)
Creatinine, Ser: 0.93 mg/dL (ref 0.44–1.00)
GFR, Estimated: >60 mL/min (ref >60)
Glucose, Bld: 82 mg/dL (ref 70–99)
Potassium: 4.3 mmol/L (ref 3.5–5.1)
Sodium: 137 mmol/L (ref 135–145)

## 2022-08-21 LAB — CBC WITH DIFFERENTIAL/PLATELET
Abs Immature Granulocytes: 0.18 10*3/uL — ABNORMAL HIGH (ref 0.00–0.07)
Basophils Absolute: 0.1 10*3/uL (ref 0.0–0.1)
Basophils Relative: 1 %
Eosinophils Absolute: 0.2 10*3/uL (ref 0.0–0.5)
Eosinophils Relative: 2 %
HCT: 42.9 % (ref 36.0–46.0)
Hemoglobin: 13.6 g/dL (ref 12.0–15.0)
Immature Granulocytes: 2 %
Lymphocytes Relative: 25 %
Lymphs Abs: 2.8 10*3/uL (ref 0.7–4.0)
MCH: 28.9 pg (ref 26.0–34.0)
MCHC: 31.7 g/dL (ref 30.0–36.0)
MCV: 91.1 fL (ref 80.0–100.0)
Monocytes Absolute: 0.6 10*3/uL (ref 0.1–1.0)
Monocytes Relative: 5 %
Neutro Abs: 7.3 10*3/uL (ref 1.7–7.7)
Neutrophils Relative %: 65 %
Platelets: 283 10*3/uL (ref 150–400)
RBC: 4.71 MIL/uL (ref 3.87–5.11)
RDW: 15.6 % — ABNORMAL HIGH (ref 11.5–15.5)
WBC: 11.2 10*3/uL — ABNORMAL HIGH (ref 4.0–10.5)
nRBC: 0 % (ref 0.0–0.2)

## 2022-08-21 LAB — TROPONIN I (HIGH SENSITIVITY)
Troponin I (High Sensitivity): 4 ng/L (ref <18)
Troponin I (High Sensitivity): 3 ng/L (ref <18)

## 2022-08-21 NOTE — ED Triage Notes (Signed)
Pt reports sharp, stabbing, intermittent central CP that started this morning, along with SOB with exertion for 2 days.  Pt able to ambulate and converse without distress. H/o heart surgery in March, and anxiety.  Denies n/v

## 2022-08-21 NOTE — ED Provider Notes (Signed)
[facility] EMERGENCY DEPT Provider Note   CSN: [id number] Arrival date & time: 08/21/22  7681     History  Chief Complaint  Patient presents with   Chest Pain    Carla Huffman is a 51 y.o. female.  She had an episode of fast heart rate that happened 2 nights ago for about a minute.  She has a history of SVT and had an ablation and has not had an episode since then.  Since yesterday she felt little short of breath intermittently.  Today experienced stabbing substernal chest pain that lasted about a minute at a time intermittent while she was driving.  She said she has not had this pain since her prior cardiac rhythm problems.  No diaphoresis dizziness nausea vomiting.  No numbness or weakness.  No known coronary disease  The history is provided by the patient.  Chest Pain Pain location:  Substernal area Pain quality: stabbing   Pain radiates to:  Does not radiate Onset quality:  Sudden Timing:  Sporadic Progression:  Unchanged Chronicity:  New Relieved by:  None tried Worsened by:  Nothing Ineffective treatments:  None tried Associated symptoms: cough and shortness of breath   Associated symptoms: no abdominal pain, no back pain, no diaphoresis, no fever, no nausea and no vomiting   Risk factors: no coronary artery disease        Home Medications Prior to Admission medications   Medication Sig Start Date End Date Taking? Authorizing Provider  calcium carbonate (OS-CAL) 600 MG TABS tablet Take 600 mg by mouth daily with breakfast.    [provider]  ibuprofen (ADVIL) 200 MG tablet Take 600 mg by mouth every 6 (six) hours as needed for mild pain.    [provider]  levothyroxine (SYNTHROID) 25 MCG tablet TAKE 1 TABLET (25 MCG TOTAL) BY MOUTH EVERY OTHER DAY. 06/22/22   [place], Raymond J, MD  levothyroxine (SYNTHROID) 300 MCG tablet TAKE 1 TABLET (300 MCG TOTAL) BY MOUTH DAILY BEFORE BREAKFAST. 06/25/22   [place], Buren Kos, MD   Levothyroxine Sodium 13 MCG CAPS TAKE 1 CAPSULE (13 MCG TOTAL) BY MOUTH DAILY BEFORE BREAKFAST. 12/17/21   [place], Buren Kos, MD  methocarbamol (ROBAXIN) 500 MG tablet Take 1 tablet (500 mg total) by mouth 2 (two) times daily. 05/03/22   Raspet, Noberto Retort, PA-C  metoprolol succinate (TOPROL XL) 25 MG 24 hr tablet Take 1 tablet (25 mg total) by mouth at bedtime. 01/29/22   Lanier Prude, MD  MISC NATURAL PRODUCTS PO Take by mouth. Seamoss - take daily in smoothie    [provider]  MISC NATURAL PRODUCTS PO Take by mouth. Hemp Protein Powder - Take daily in smoothie    [provider]  Multiple Vitamin (MULTIVITAMIN WITH MINERALS) TABS tablet Take 1 tablet by mouth daily.    [provider]  ondansetron (ZOFRAN-ODT) 8 MG disintegrating tablet Take 1 tablet (8 mg total) by mouth every 8 (eight) hours as needed for nausea. 02/05/22   [place], Raymond J, MD  vitamin B-12 (CYANOCOBALAMIN) 1000 MCG tablet Take 1,000 mcg by mouth daily.    [provider]      Allergies    Keflex [cephalexin] and Levofloxacin    Review of Systems   Review of Systems  Constitutional:  Negative for diaphoresis and fever.  HENT:  Negative for sore throat.   Respiratory:  Positive for cough and shortness of breath.   Cardiovascular:  Positive for chest pain.  Gastrointestinal:  Negative for abdominal pain, nausea and vomiting.  Genitourinary:  Negative for dysuria.  Musculoskeletal:  Negative for back pain.  Skin:  Negative for rash.    Physical Exam Updated Vital Signs BP 122/87   Pulse 76   Temp 98.2 F (36.8 C) (Oral)   Resp 17   Ht 5\' 10"  (1.778 m)   Wt 136.1 kg   SpO2 96%   BMI 43.05 kg/m  Physical Exam Vitals and nursing note reviewed.  Constitutional:      General: She is not in acute distress.    Appearance: She is well-developed.  HENT:     Head: Normocephalic and atraumatic.  Eyes:     Conjunctiva/sclera: Conjunctivae normal.  Cardiovascular:     Rate  and Rhythm: Normal rate and regular rhythm.     Heart sounds: Normal heart sounds. No murmur heard. Pulmonary:     Effort: Pulmonary effort is normal. No respiratory distress.     Breath sounds: Normal breath sounds.  Abdominal:     Palpations: Abdomen is soft.     Tenderness: There is no abdominal tenderness.  Musculoskeletal:        General: No swelling. Normal range of motion.     Cervical back: Neck supple.     Right lower leg: No tenderness. No edema.     Left lower leg: No tenderness. No edema.  Skin:    General: Skin is warm and dry.     Capillary Refill: Capillary refill takes less than 2 seconds.  Neurological:     General: No focal deficit present.     Mental Status: She is alert.     ED Results / Procedures / Treatments   Labs (all labs ordered are listed, but only abnormal results are displayed) Labs Reviewed  BASIC METABOLIC PANEL - Abnormal; Notable for the following components:      Result Value   Calcium 8.5 (*)    All other components within normal limits  CBC WITH DIFFERENTIAL/PLATELET - Abnormal; Notable for the following components:   WBC 11.2 (*)    RDW 15.6 (*)    Abs Immature Granulocytes 0.18 (*)    All other components within normal limits  TROPONIN I (HIGH SENSITIVITY)  TROPONIN I (HIGH SENSITIVITY)    EKG None  Radiology DG Chest Port 1 View  Result Date: 08/21/2022 CLINICAL DATA:  Chest pain and shortness of breath EXAM: PORTABLE CHEST 1 VIEW COMPARISON:  10/25/2021 FINDINGS: Clips from prior thyroidectomy. Low lung volumes are present, causing crowding of the pulmonary vasculature. Prominent pericardial adipose tissues. Mildly indistinct left hemidiaphragm although this has been present on prior exams and may have to do with hemidiaphragmatic and cardiac contour rather than necessarily indicative of a left lower lobe airspace opacity. The right lung appears clear. No significant bony abnormality. Heart size is within normal limits given the  AP projection and low lung volumes. IMPRESSION: 1. Indistinct left hemidiaphragm believed to likely be due to cardiac and diaphragmatic contour rather than left lower lobe airspace opacity. 2. Low lung volumes are present, causing crowding of the pulmonary vasculature. 3. Prominent pericardial adipose tissues. 4. Prior thyroidectomy. Electronically Signed   By: 10/27/2021 M.D.   On: 08/21/2022 10:00    Procedures Procedures    Medications Ordered in ED Medications - No data to display  ED Course/ Medical Decision Making/ A&P Clinical Course as of 08/21/22 1712  Fri Aug 21, 2022  1003 Chest x-ray shows poor inspiration no clear  infiltrates.  Awaiting radiology reading. [MB]    Clinical Course User Index [MB] Terrilee Files, MD                           Medical Decision Making Amount and/or Complexity of Data Reviewed Labs: ordered. Radiology: ordered.   This patient complains of chest pain shortness of breath palpitations; this involves an extensive number of treatment Options and is a complaint that carries with it a high risk of complications and morbidity. The differential includes pneumonia, pneumothorax, PE, arrhythmia, ischemia  I ordered, reviewed and interpreted labs, which included CBC with mildly elevated white count normal hemoglobin, chemistries normal, troponins flat I ordered imaging studies which included chest x-ray and I independently    visualized and interpreted imaging which showed no acute findings Previous records obtained and reviewed in epic including prior cardiology notes Cardiac monitoring reviewed, normal sinus rhythm no significant arrhythmias Social determinants considered, no barriers identified Critical Interventions: None  After the interventions stated above, I reevaluated the patient and found patient to be asymptomatic pain-free Admission and further testing considered, recommended close follow-up with PCP and cardiology.  Return  instructions discussed         Final Clinical Impression(s) / ED Diagnoses Final diagnoses:  Nonspecific chest pain    Rx / DC Orders ED Discharge Orders     None         Terrilee Files, MD 08/21/22 1714

## 2022-08-21 NOTE — Telephone Encounter (Signed)
Patient c/o Palpitations:  High priority if patient c/o lightheadedness, shortness of breath, or chest pain  How long have you had palpitations/irregular HR/ Afib? Are you having the symptoms now?  Patient states 2 nights ago she had an episode of palpitations where she could visibly see her chest pulsating  Are you currently experiencing lightheadedness, SOB or CP?  Not currently  Do you have a history of afib (atrial fibrillation) or irregular heart rhythm?  Patient had recent ablation, but states she does not remember her diagnosis  Have you checked your BP or HR? (document readings if available):  11/16: 116/78 90 (sitting)  Are you experiencing any other symptoms?   SOB past 3 days, CP

## 2022-08-21 NOTE — Discharge Instructions (Addendum)
You were seen in the emergency department for some chest pain and shortness of breath.  You had lab work chest x-ray and EKG that did not show any significant abnormalities.  Please continue your regular medications and follow-up with your primary care doctor and cardiologist.  Return to the emergency department if any worsening or concerning symptoms.

## 2022-08-21 NOTE — Telephone Encounter (Signed)
Returned call to patient,   Patient states two nights ago she had palpitations for the first time since her surgery. She felt the palpitations, could see her chest moving up and down. Shortness of breath times a few days. Notes some jabbing pain in her chest starting this morning and still happening. Based on these symptoms patient advised to be seen in the ED. Patient agreeable to being seen and verbalizes understanding.

## 2022-08-21 NOTE — ED Notes (Signed)
Discharge instructions and follow up care reviewed and explained, pt verbalized understanding and had no further questions on d/c. Pt caox4, ambulatory, denying CP and SOB on d/c.

## 2022-08-26 ENCOUNTER — Other Ambulatory Visit (HOSPITAL_BASED_OUTPATIENT_CLINIC_OR_DEPARTMENT_OTHER): Payer: Self-pay | Admitting: Family Medicine

## 2022-10-02 ENCOUNTER — Encounter (HOSPITAL_BASED_OUTPATIENT_CLINIC_OR_DEPARTMENT_OTHER): Payer: Self-pay | Admitting: Family Medicine

## 2022-10-02 ENCOUNTER — Ambulatory Visit (INDEPENDENT_AMBULATORY_CARE_PROVIDER_SITE_OTHER): Payer: 59 | Admitting: Family Medicine

## 2022-10-02 DIAGNOSIS — J019 Acute sinusitis, unspecified: Secondary | ICD-10-CM | POA: Diagnosis not present

## 2022-10-02 DIAGNOSIS — E039 Hypothyroidism, unspecified: Secondary | ICD-10-CM | POA: Diagnosis not present

## 2022-10-02 DIAGNOSIS — J329 Chronic sinusitis, unspecified: Secondary | ICD-10-CM | POA: Insufficient documentation

## 2022-10-02 NOTE — Assessment & Plan Note (Signed)
Patient reports that since her last visit with me about 2 months ago, she has continued to have sinus congestion, rhinorrhea, intermittent cough.  She has been using some over-the-counter medications, specifically references using a Vicks spray.  She has not been using any intranasal sprays, no antihistamines.  She has not been having any fevers or chills.  No significant shortness of breath. On exam, patient is in no acute distress, vital signs stable, oxygen saturation is normal. Discussed options today.  Given continued ongoing symptoms, would recommend proceeding with OTC medications to help with control of symptoms.  Do not suspect acute illness as cause of ongoing symptoms.  Recommend utilizing intranasal steroid spray as well as intranasal saline spray.  Discussed options as well as proper administration.  Given ongoing nature, would also recommend incorporating oral antihistamine into treatment regimen, discussed considerations for this including Claritin, Zyrtec, Allegra. Referral to ENT placed today in order to arrange for further evaluation should symptoms persist despite measures.

## 2022-10-02 NOTE — Assessment & Plan Note (Signed)
Patient reports that over the past 2 to 3 months, she has experienced about a 15 pound weight gain.  She feels that she has not had any significant changes to her physical activity level or diet.  Thinks that she has been trying to eat healthier over this timeframe during which she has had increasing weight.  Because of the weight changes and lack of Eliquis focusing on lifestyle modification, she has questions about injectable medications like Wegovy. We did discuss GLP-1 receptor agonist and discussed typical titration regimen for these medications, potential risks and side effects related to this class of medications.  Discussed potential supply issues, particular related to [facility] as well as potential cost issues as some insurances may not cover medication well.  Also reviewed that prior to proceeding with medication, would want to ensure no other organic cause for observed weight increase, particularly would need to assess TSH.  Would also recheck hemoglobin A1c to ensure no changes with this Will proceed with labs today including TSH and A1c If labs are unremarkable, can proceed with initiation of Wegovy at that time and plan for 1 month follow-up

## 2022-10-02 NOTE — Progress Notes (Signed)
    Procedures performed today:    None.  Independent interpretation of notes and tests performed by another provider:   None.  Brief History, Exam, Impression, and Recommendations:    BP (!) 152/95 (BP Location: Right Arm, Patient Position: Sitting, Cuff Size: Large)   Pulse 71   Ht 5\' 10"  (1.778 m)   Wt (!) 316 lb 14.4 oz (143.7 kg)   SpO2 100%   BMI 45.47 kg/m   Sinusitis Patient reports that since her last visit with me about 2 months ago, she has continued to have sinus congestion, rhinorrhea, intermittent cough.  She has been using some over-the-counter medications, specifically references using a Vicks spray.  She has not been using any intranasal sprays, no antihistamines.  She has not been having any fevers or chills.  No significant shortness of breath. On exam, patient is in no acute distress, vital signs stable, oxygen saturation is normal. Discussed options today.  Given continued ongoing symptoms, would recommend proceeding with OTC medications to help with control of symptoms.  Do not suspect acute illness as cause of ongoing symptoms.  Recommend utilizing intranasal steroid spray as well as intranasal saline spray.  Discussed options as well as proper administration.  Given ongoing nature, would also recommend incorporating oral antihistamine into treatment regimen, discussed considerations for this including Claritin, Zyrtec, Allegra. Referral to ENT placed today in order to arrange for further evaluation should symptoms persist despite measures.  Severe obesity (BMI >= 40) (HCC) Patient reports that over the past 2 to 3 months, she has experienced about a 15 pound weight gain.  She feels that she has not had any significant changes to her physical activity level or diet.  Thinks that she has been trying to eat healthier over this timeframe during which she has had increasing weight.  Because of the weight changes and lack of Eliquis focusing on lifestyle modification, she  has questions about injectable medications like Wegovy. We did discuss GLP-1 receptor agonist and discussed typical titration regimen for these medications, potential risks and side effects related to this class of medications.  Discussed potential supply issues, particular related to [facility] as well as potential cost issues as some insurances may not cover medication well.  Also reviewed that prior to proceeding with medication, would want to ensure no other organic cause for observed weight increase, particularly would need to assess TSH.  Would also recheck hemoglobin A1c to ensure no changes with this Will proceed with labs today including TSH and A1c If labs are unremarkable, can proceed with initiation of Wegovy at that time and plan for 1 month follow-up   ___________________________________________ Latrice Storlie de [facility], MD, ABFM, CAQSM Primary Care and Sports Medicine [facility]

## 2022-10-03 LAB — TSH: TSH: 75 u[IU]/mL — ABNORMAL HIGH (ref 0.450–4.500)

## 2022-10-03 LAB — HEMOGLOBIN A1C
Est. average glucose Bld gHb Est-mCnc: 117 mg/dL
Hgb A1c MFr Bld: 5.7 % — ABNORMAL HIGH (ref 4.8–5.6)

## 2022-10-07 ENCOUNTER — Ambulatory Visit (HOSPITAL_BASED_OUTPATIENT_CLINIC_OR_DEPARTMENT_OTHER): Payer: 59 | Admitting: Family Medicine

## 2022-10-07 ENCOUNTER — Other Ambulatory Visit (HOSPITAL_BASED_OUTPATIENT_CLINIC_OR_DEPARTMENT_OTHER): Payer: Self-pay | Admitting: Family Medicine

## 2022-10-07 DIAGNOSIS — E039 Hypothyroidism, unspecified: Secondary | ICD-10-CM

## 2022-10-07 LAB — T4, FREE: Free T4: 0.44 ng/dL — ABNORMAL LOW (ref 0.82–1.77)

## 2022-10-07 LAB — SPECIMEN STATUS REPORT

## 2022-10-07 NOTE — Progress Notes (Signed)
LVM for pt to cb to sch- nurse visit in 6 to 8 weeks to repeat TSH

## 2022-11-12 ENCOUNTER — Telehealth (HOSPITAL_BASED_OUTPATIENT_CLINIC_OR_DEPARTMENT_OTHER): Payer: Self-pay | Admitting: Family Medicine

## 2022-11-12 NOTE — Telephone Encounter (Signed)
Pt has advised NO to the FLU VACCINE

## 2022-11-20 ENCOUNTER — Ambulatory Visit (HOSPITAL_BASED_OUTPATIENT_CLINIC_OR_DEPARTMENT_OTHER): Payer: 59

## 2022-11-20 DIAGNOSIS — E039 Hypothyroidism, unspecified: Secondary | ICD-10-CM

## 2022-11-21 LAB — TSH RFX ON ABNORMAL TO FREE T4: TSH: 0.148 u[IU]/mL — ABNORMAL LOW (ref 0.450–4.500)

## 2022-11-21 LAB — T4F: T4,Free (Direct): 2.43 ng/dL — ABNORMAL HIGH (ref 0.82–1.77)

## 2022-12-07 ENCOUNTER — Other Ambulatory Visit: Payer: Self-pay

## 2022-12-07 ENCOUNTER — Ambulatory Visit (INDEPENDENT_AMBULATORY_CARE_PROVIDER_SITE_OTHER): Payer: 59

## 2022-12-07 ENCOUNTER — Encounter (HOSPITAL_COMMUNITY): Payer: Self-pay

## 2022-12-07 ENCOUNTER — Ambulatory Visit (HOSPITAL_COMMUNITY)
Admission: RE | Admit: 2022-12-07 | Discharge: 2022-12-07 | Disposition: A | Discharge status: Home / Self Care | Payer: 59 | Source: Ambulatory Visit | Attending: Family Medicine | Admitting: Family Medicine

## 2022-12-07 VITALS — BP 164/89 | HR 74 | Temp 97.7°F | Resp 20

## 2022-12-07 DIAGNOSIS — M5442 Lumbago with sciatica, left side: Secondary | ICD-10-CM

## 2022-12-07 DIAGNOSIS — M545 Low back pain, unspecified: Secondary | ICD-10-CM | POA: Diagnosis not present

## 2022-12-07 DIAGNOSIS — W19XXXA Unspecified fall, initial encounter: Secondary | ICD-10-CM | POA: Diagnosis not present

## 2022-12-07 DIAGNOSIS — M549 Dorsalgia, unspecified: Secondary | ICD-10-CM

## 2022-12-07 MED ORDER — KETOROLAC TROMETHAMINE 30 MG/ML IJ SOLN
30.0000 mg | Freq: Once | INTRAMUSCULAR | Status: AC
  Administered 2022-12-07: 30 mg via INTRAMUSCULAR

## 2022-12-07 MED ORDER — KETOROLAC TROMETHAMINE 10 MG PO TABS: 10.0000 mg | ORAL_TABLET | Freq: Four times a day (QID) | ORAL | 0 refills | Status: DC | PRN

## 2022-12-07 MED ORDER — TIZANIDINE HCL 4 MG PO TABS: 4.0000 mg | ORAL_TABLET | Freq: Three times a day (TID) | ORAL | 0 refills | Status: DC | PRN

## 2022-12-07 MED ORDER — KETOROLAC TROMETHAMINE 30 MG/ML IJ SOLN
INTRAMUSCULAR | Status: AC
  Filled 2022-12-07: qty 1

## 2022-12-07 NOTE — ED Triage Notes (Signed)
Pain started in middle back, now feel burning in my neck and shooting pain into hip, left hip.   Started with muscle spasms on Wednesday 12/02/2022.  Patient will not go away.  Has used ibuprofen and tylenol and these medicines did help some.

## 2022-12-07 NOTE — ED Provider Notes (Signed)
[place]    CSN: [id number] Arrival date & time: 12/07/22  1507      History   Chief Complaint Chief Complaint  Patient presents with   Back Pain   Appointment    15:30    HPI Carla Huffman is a 52 y.o. female.    Back Pain  Here with left low back pain that is been bothering her for a few days.  At first it eased off a little bit with Tylenol, but then it has been pretty constant since then.  Now her left low back pain is radiating into her left lateral upper thigh.  Also she has some pain and burning in her left neck and left posterior shoulder.    No trauma or fall no cough or fever.  No rash   Past Medical History:  Diagnosis Date   Anxiety    Hypertension    Hypothyroidism     Patient Active Problem List   Diagnosis Date Noted   Sinusitis 10/02/2022   Severe obesity (BMI >= 40) (HCC) 10/02/2022   Sore throat 07/27/2022   Dizziness 02/25/2022   Nausea and vomiting 02/05/2022   Hypoparathyroidism ([place]) 12/16/2021   Hypocalcemia 10/20/2021   Hypertension 10/20/2021   Cardiac ischemia 08/28/2021   Supraventricular tachycardia 08/28/2021   Hypothyroidism 06/30/2017   Generalized anxiety disorder 06/30/2017    Past Surgical History:  Procedure Laterality Date   CESAREAN SECTION     SVT ABLATION N/A 12/26/2021   Procedure: SVT ABLATION;  Surgeon: Vickie Epley, MD;  Location: [facility];  Service: Cardiovascular;  Laterality: N/A;   THYROIDECTOMY      OB History   No obstetric history on file.      Home Medications    Prior to Admission medications   Medication Sig Start Date End Date Taking? Authorizing Provider  ketorolac (TORADOL) 10 MG tablet Take 1 tablet (10 mg total) by mouth every 6 (six) hours as needed (pain). 12/07/22  Yes Chazlyn Cude, Gwenlyn Perking, MD  tiZANidine (ZANAFLEX) 4 MG tablet Take 1 tablet (4 mg total) by mouth every 8 (eight) hours as needed for muscle spasms. 12/07/22  Yes Barrett Henle, MD  calcium  carbonate (OS-CAL) 600 MG TABS tablet Take 600 mg by mouth daily with breakfast.    [provider]  levothyroxine (SYNTHROID) 25 MCG tablet TAKE 1 TABLET (25 MCG TOTAL) BY MOUTH EVERY OTHER DAY. 06/22/22   de [place], Raymond J, MD  levothyroxine (SYNTHROID) 300 MCG tablet TAKE 1 TABLET (300 MCG TOTAL) BY MOUTH DAILY BEFORE BREAKFAST. 08/26/22   de [place], Blondell Reveal, MD  Levothyroxine Sodium 13 MCG CAPS TAKE 1 CAPSULE (13 MCG TOTAL) BY MOUTH DAILY BEFORE BREAKFAST. 12/17/21   de [place], Blondell Reveal, MD  metoprolol succinate (TOPROL XL) 25 MG 24 hr tablet Take 1 tablet (25 mg total) by mouth at bedtime. 01/29/22   Vickie Epley, MD  MISC NATURAL PRODUCTS PO Take by mouth. Seamoss - take daily in smoothie    [provider]  MISC NATURAL PRODUCTS PO Take by mouth. Hemp Protein Powder - Take daily in smoothie    [provider]  Multiple Vitamin (MULTIVITAMIN WITH MINERALS) TABS tablet Take 1 tablet by mouth daily.    [provider]  ondansetron (ZOFRAN-ODT) 8 MG disintegrating tablet Take 1 tablet (8 mg total) by mouth every 8 (eight) hours as needed for nausea. 02/05/22   de [place], Raymond J, MD  vitamin B-12 (CYANOCOBALAMIN) 1000  MCG tablet Take 1,000 mcg by mouth daily.    [provider]    Family History Family History  Problem Relation Age of Onset   Breast cancer Mother 54   Brain cancer Father 65   Breast cancer Other 13       Half sister--same father    Social History Social History   Tobacco Use   Smoking status: Former    Types: Cigarettes    Quit date: 10/06/1995    Years since quitting: 27.1   Smokeless tobacco: Never  Vaping Use   Vaping Use: Never used  Substance Use Topics   Alcohol use: Yes    Comment: ocassionaly   Drug use: No     Allergies   Keflex [cephalexin] and Levofloxacin   Review of Systems Review of Systems  Musculoskeletal:  Positive for back pain.     Physical Exam Triage Vital Signs ED Triage Vitals   Enc Vitals Group     BP 12/07/22 1543 (!) 164/89     Pulse Rate 12/07/22 1543 74     Resp 12/07/22 1543 20     Temp 12/07/22 1543 97.7 F (36.5 C)     Temp Source 12/07/22 1543 Oral     SpO2 12/07/22 1543 97 %     Weight --      Height --      Head Circumference --      Peak Flow --      Pain Score 12/07/22 1541 7     Pain Loc --      Pain Edu? --      Excl. in [place]? --    No data found.  Updated Vital Signs BP (!) 164/89 (BP Location: Left Arm) Comment (BP Location): regular size cuff on forearm  Pulse 74   Temp 97.7 F (36.5 C) (Oral)   Resp 20   SpO2 97%   Visual Acuity Right Eye Distance:   Left Eye Distance:   Bilateral Distance:    Right Eye Near:   Left Eye Near:    Bilateral Near:     Physical Exam Vitals reviewed.  Constitutional:      General: She is not in acute distress.    Appearance: She is not ill-appearing, toxic-appearing or diaphoretic.  HENT:     Mouth/Throat:     Mouth: Mucous membranes are moist.  Eyes:     Extraocular Movements: Extraocular movements intact.     Pupils: Pupils are equal, round, and reactive to light.  Cardiovascular:     Rate and Rhythm: Normal rate and regular rhythm.     Heart sounds: No murmur heard. Pulmonary:     Effort: Pulmonary effort is normal.     Breath sounds: Normal breath sounds.  Musculoskeletal:     Cervical back: Neck supple.     Comments: There is some tenderness in the left lumbosacral area.  No rash there is also little spasm of the left trapezius near the neck and shoulder.  Lymphadenopathy:     Cervical: No cervical adenopathy.  Neurological:     General: No focal deficit present.     Mental Status: She is alert and oriented to person, place, and time.  Psychiatric:        Behavior: Behavior normal.      UC Treatments / Results  Labs (all labs ordered are listed, but only abnormal results are displayed) Labs Reviewed - No data to display  EKG   Radiology DG Lumbar Spine  2-3  Views  Result Date: 12/07/2022 CLINICAL DATA:  Fall.  Low back pain. EXAM: LUMBAR SPINE - 2-3 VIEW COMPARISON:  CT abdomen 10/17/2010 FINDINGS: Body habitus reduces diagnostic sensitivity and specificity. No fracture or acute bony findings noted. Minimal anterior interbody spurring at L1-2 with 2 mm degenerative retrolisthesis at L1-2. IMPRESSION: 1. No fracture or acute bony findings identified. If pain persists despite conservative therapy, MRI may be warranted for further characterization. 2. Mild degenerative findings at L1-2. Electronically Signed   By: Van Clines M.D.   On: 12/07/2022 17:01    Procedures Procedures (including critical care time)  Medications Ordered in UC Medications  ketorolac (TORADOL) 30 MG/ML injection 30 mg (has no administration in time range)    Initial Impression / Assessment and Plan / UC Course  I have reviewed the triage vital signs and the nursing notes.  Pertinent labs & imaging results that were available during my care of the patient were reviewed by me and considered in my medical decision making (see chart for details).        There are some mild degenerative changes on the L-spine films.  On review of her prior C-spine films she also had some degenerative changes on those.  Toradol shot is given here and tizanidine is sent in as a muscle relaxer.  Oral Toradol is sent in to take for the next 4 to 5 days.  She will follow-up with her primary care Final Clinical Impressions(s) / UC Diagnoses   Final diagnoses:  Acute left-sided low back pain with left-sided sciatica  Upper back pain on left side     Discharge Instructions      There is a little bit of arthritic changes in your lumbar spine.  You have been given a shot of Toradol 30 mg today.  Ketorolac/Toradol 10 mg tablets-1 tablet by mouth every 6 hours as needed for pain   Take tizanidine 4 mg--1 every 8 hours as needed for muscle spasms; this medication can cause dizziness  and sleepiness      ED Prescriptions     Medication Sig Dispense Auth. Provider   ketorolac (TORADOL) 10 MG tablet Take 1 tablet (10 mg total) by mouth every 6 (six) hours as needed (pain). 20 tablet Destani Wamser, Gwenlyn Perking, MD   tiZANidine (ZANAFLEX) 4 MG tablet Take 1 tablet (4 mg total) by mouth every 8 (eight) hours as needed for muscle spasms. 15 tablet Elianna Windom, Gwenlyn Perking, MD      I have reviewed the PDMP during this encounter.   Barrett Henle, MD 12/07/22 [phone]

## 2022-12-07 NOTE — Discharge Instructions (Signed)
There is a little bit of arthritic changes in your lumbar spine.  You have been given a shot of Toradol 30 mg today.  Ketorolac/Toradol 10 mg tablets-1 tablet by mouth every 6 hours as needed for pain   Take tizanidine 4 mg--1 every 8 hours as needed for muscle spasms; this medication can cause dizziness and sleepiness

## 2022-12-09 ENCOUNTER — Ambulatory Visit (HOSPITAL_BASED_OUTPATIENT_CLINIC_OR_DEPARTMENT_OTHER): Payer: 59 | Admitting: Family Medicine

## 2022-12-09 ENCOUNTER — Encounter (HOSPITAL_BASED_OUTPATIENT_CLINIC_OR_DEPARTMENT_OTHER): Payer: Self-pay | Admitting: Family Medicine

## 2022-12-09 VITALS — BP 128/76 | HR 77 | Temp 97.5°F | Ht 70.0 in | Wt 298.3 lb

## 2022-12-09 DIAGNOSIS — E039 Hypothyroidism, unspecified: Secondary | ICD-10-CM | POA: Diagnosis not present

## 2022-12-09 DIAGNOSIS — M545 Low back pain, unspecified: Secondary | ICD-10-CM

## 2022-12-09 MED ORDER — MELOXICAM 7.5 MG PO TABS: 7.5000 mg | ORAL_TABLET | Freq: Every day | ORAL | 0 refills | Status: DC

## 2022-12-09 MED ORDER — LEVOTHYROXINE SODIUM 300 MCG PO TABS
300.0000 ug | ORAL_TABLET | Freq: Every day | ORAL | 1 refills | Status: DC
Start: 2022-12-09 — End: 2023-02-11

## 2022-12-09 NOTE — Progress Notes (Signed)
    Procedures performed today:    None.  Independent interpretation of notes and tests performed by another provider:   None.  Brief History, Exam, Impression, and Recommendations:    BP 128/76   Pulse 77   Temp (!) 97.5 F (36.4 C) (Oral)   Ht  (1.778 m)   Wt 298 lb 4.8 oz (135.3 kg)   SpO2 99%   BMI 42.80 kg/m   Low back pain Patient seen a couple days ago at local urgent care.  At that time, she had a few day history of low back pain.  She had some improvement with use of Tylenol, however her pain returned and remained persistent.  She had some radiation into left lateral upper thigh.  Imaging completed at urgent care with degenerative changes noted, primarily at L1-2.  At urgent care, patient did receive Toradol IM and prescription for Toradol to take by mouth.  She was also provided with muscle relaxer.  Today she reports that she continues to have some symptoms, did get some relief with use of Toradol, muscle relaxer has been less effective.  No new symptoms since visit to the urgent care, no new bowel or bladder issues, no new radicular symptoms, no associated numbness or tingling. On exam, patient is in no acute distress.  No tenderness to palpation over spinous processes within lumbar region, mild tenderness to palpation through left paraspinal muscles in lumbar region.  Distal neurovascular exam is intact. Discussed options.  Can continue with as needed use of NSAID and/or Tylenol.  Would also recommend further evaluation with physical therapy, patient amenable to this, referral placed today.  Advised on home exercise program as per PT Will plan to follow-up in about 6 to 8 weeks to monitor progress.  If symptoms do persist or if any worsening does occur, we would consider proceeding with advanced imaging, likely with MRI  Hypothyroidism Recent TSH found to be low normal range.  Clinically, patient is euthyroid, she denies any temperature intolerances, no changes in  bowel habits, no notable weight changes, no palpitations or elevated heart rate reported. Discussed options pertaining to recent laboratory findings, we will proceed with slight adjustment in levothyroxine dose, patient previously was taking 325 mcg daily, we will adjust this down to 300 mcg daily, refill sent to pharmacy Will plan to recheck TSH in about 6 to 8 weeks to monitor progress and determine if any further changes in dosage are needed  Return in about 2 months (around 02/08/2023).   ___________________________________________ Stephanos Fan de [place], MD, ABFM, CAQSM Primary Care and Sports Medicine [facility]

## 2022-12-15 ENCOUNTER — Encounter (HOSPITAL_BASED_OUTPATIENT_CLINIC_OR_DEPARTMENT_OTHER): Payer: Self-pay | Admitting: Physical Therapy

## 2022-12-15 ENCOUNTER — Other Ambulatory Visit: Payer: Self-pay

## 2022-12-15 ENCOUNTER — Ambulatory Visit (HOSPITAL_BASED_OUTPATIENT_CLINIC_OR_DEPARTMENT_OTHER): Payer: 59 | Attending: Family Medicine | Admitting: Physical Therapy

## 2022-12-15 DIAGNOSIS — M542 Cervicalgia: Secondary | ICD-10-CM | POA: Insufficient documentation

## 2022-12-15 DIAGNOSIS — M62838 Other muscle spasm: Secondary | ICD-10-CM | POA: Insufficient documentation

## 2022-12-15 DIAGNOSIS — M545 Low back pain, unspecified: Secondary | ICD-10-CM | POA: Insufficient documentation

## 2022-12-15 DIAGNOSIS — R2689 Other abnormalities of gait and mobility: Secondary | ICD-10-CM | POA: Insufficient documentation

## 2022-12-15 DIAGNOSIS — M5459 Other low back pain: Secondary | ICD-10-CM | POA: Insufficient documentation

## 2022-12-15 NOTE — Therapy (Addendum)
OUTPATIENT PHYSICAL THERAPY THORACOLUMBAR EVALUATION   Patient Name: Carla Huffman MRN: [id number] DOB:August 07, 1971, 52 y.o., female Today's Date: 12/15/2022  END OF SESSION:   Past Medical History:  Diagnosis Date   Anxiety    Hypertension    Hypothyroidism    Past Surgical History:  Procedure Laterality Date   CESAREAN SECTION     SVT ABLATION N/A 12/26/2021   Procedure: SVT ABLATION;  Surgeon: Vickie Epley, MD;  Location: [facility];  Service: Cardiovascular;  Laterality: N/A;   THYROIDECTOMY     Patient Active Problem List   Diagnosis Date Noted   Low back pain 12/09/2022   Sinusitis 10/02/2022   Severe obesity (BMI >= 40) (HCC) 10/02/2022   Sore throat 07/27/2022   Dizziness 02/25/2022   Nausea and vomiting 02/05/2022   Hypoparathyroidism ([place]) 12/16/2021   Hypocalcemia 10/20/2021   Hypertension 10/20/2021   Cardiac ischemia 08/28/2021   Supraventricular tachycardia 08/28/2021   Hypothyroidism 06/30/2017   Generalized anxiety disorder 06/30/2017    PCP:  Dr raymond [place]   REFERRING PROVIDER: Dr Arlina Robes [place]   REFERRING DIAG:M54.50 (ICD-10-CM) - Acute bilateral low back pain without sciatica  Rationale for Evaluation and Treatment: Rehabilitation  THERAPY DIAG: Other Low Back Pain  Cervicalgia  Other abnormalities of gait and mobility  Other muscle spasm    ONSET DATE: 2 months prior   SUBJECTIVE:                                                                                                                                                                                           SUBJECTIVE STATEMENT: Patient had an acute insidious onset of lower back pain starting approximately 2 months ago.  The pain was across her lower back.  In 2023 she had pain in her neck.  That pain resolved.  Now she is beginning to have pain in her lower back and upper back.  She sits at a desk for work.  The pain is increased over the past few weeks to the  point where she is having difficulty upon initial standing to get walking.  PERTINENT HISTORY:  Cervical pain, S SVT, anxiety, remote history of cesarean section.  PAIN:  Are you having pain? Yes: NPRS scale: 5/10 right now; has reached a 10/10  Pain location: Low Back/ Neck and shoulders  Pain description: aching  Aggravating factors: Sitting is the worst; can also feel it when he is walking Relieving factors: Changing position  PRECAUTIONS: None  WEIGHT BEARING RESTRICTIONS: No  FALLS:  Has patient fallen in last 6 months? No  LIVING ENVIRONMENT: 3 steps into the house  OCCUPATION:  Works at a desk mostly doing data entry  Recreation: Hiking    PLOF: Independent  PATIENT GOALS:  Get the pain under control  Sleeping position: Sleeps on side left  NEXT MD VISIT:    OBJECTIVE:   DIAGNOSTIC FINDINGS:  X-ray: Lumbar Mild degeneration at L1-L2  PATIENT SURVEYS:  FOTO    SCREENING FOR RED FLAGS: Bowel or bladder incontinence: No Spinal tumors: No Cauda equina syndrome: No Compression fracture: No Abdominal aneurysm: No  COGNITION: Overall cognitive status: Within functional limits for tasks assessed     SENSATION: Has begun losing sensation in her pinky  MUSCLE LENGTH:  POSTURE: rounded shoulders, forward head, and flexed trunk   PALPATION: Significant spasming in bilateral lower lumbar paraspinals, bilateral gluteal, bilateral upper traps, and into cervical paraspinals  LUMBAR ROM:   AROM eval  Flexion 30 degrees with pain   Extension Limited past neutral   Right lateral flexion   Left lateral flexion   Right rotation No limit no pain   Left rotation Painful limited 50%    (Blank rows = not tested)  LOWER EXTREMITY ROM:     Passive  Right eval Left eval  Hip flexion 90 degrees with pain 85 degrees with pain  Hip extension    Hip abduction    Hip adduction    Hip internal rotation Painful Painful  Hip external rotation Painful Painful   Knee flexion    Knee extension    Ankle dorsiflexion    Ankle plantarflexion    Ankle inversion    Ankle eversion     (Blank rows = not tested)  CERVICAL ROM:   Active ROM A/PROM (deg) eval  Flexion   Extension   Right lateral flexion   Left lateral flexion   Right rotation 80 degrees with minor pain  Left rotation Full with out pain   (Blank rows = not tested)   LOWER EXTREMITY MMT:    MMT Right eval Left eval  Hip flexion 16.2 16.9  Hip extension    Hip abduction 24.1 16.4  Hip adduction    Hip internal rotation    Hip external rotation    Knee flexion    Knee extension 15.4 27.5  Ankle dorsiflexion    Ankle plantarflexion    Ankle inversion    Ankle eversion     (Blank rows = not tested)  LUMBAR SPECIAL TESTS:   FUNCTIONAL TESTS:  Sit to stand: Stand slowly.  Needs several seconds before taking her initial steps.  GAIT: Decreased bilateral hip flexion, lateral movement with gait. TODAY'S TREATMENT:                                                                                                                              DATE:  Exercises - Seated Bilateral Shoulder Flexion Towel Slide at Table Top  - 1 x daily - 7 x weekly - 3 sets - Supine Lower Trunk Rotation  -  1 x daily - 7 x weekly - 3 sets - 10 reps - Theracane Over Shoulder  - 1 x daily - 7 x weekly - 3 sets - 1-2 min  hold - Standing Glute Med Mobilization with Small Ball on Wall  - 1 x daily - 7 x weekly - 3 sets - 10 reps - 1-2 min  hold   PATIENT EDUCATION:  Education details: HEP, symptom management, movement within pain-free ranges. Person educated: Patient Education method: Explanation, Demonstration, Tactile cues, Verbal cues, and Handouts Education comprehension: verbalized understanding, returned demonstration, verbal cues required, tactile cues required, and needs further education  HOME EXERCISE PROGRAM: Access Code: [email or web address] URL: [email or web address] Date:  12/16/2022 Prepared by: Carolyne Littles  ASSESSMENT:  CLINICAL IMPRESSION: Patient is a 52 year old female who presents with lower back pain that is now extended up into her mid thoracic and cervical area.  She has significant limitations in forward flexion and pain with end range extension.  She also has pain with left rotation.  She has significant spasming in her lower lumbar paraspinals and gluteals.  She also has spasming in her bilateral upper traps and into her cervical paraspinals.  She has pain when she sits.  She has pain with hip flexion past 85 degrees on the left and past 90 degrees on the right which is likely contributing to her pain when sitting.  She would benefit from skilled therapy to develop techniques to reduce pain and develop a program that improves posture when sitting.  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  OBJECTIVE IMPAIRMENTS: Abnormal gait, decreased activity tolerance, difficulty walking, decreased ROM, decreased strength, increased fascial restrictions, improper body mechanics, postural dysfunction, and pain.   ACTIVITY LIMITATIONS: carrying, lifting, bending, sitting, standing, stairs, transfers, bathing, and locomotion level  PARTICIPATION LIMITATIONS: meal prep, cleaning, laundry, driving, shopping, community activity, occupation, and yard work  PERSONAL FACTORS: Fitness and 1-2 comorbidities: anxiety, cervical pain   are also affecting patient's functional outcome.   REHAB POTENTIAL: Good  CLINICAL DECISION MAKING: Evolving/moderate complexity increasing pain throughout her back   EVALUATION COMPLEXITY: Moderate   GOALS: Goals reviewed with patient? Yes  SHORT TERM GOALS: Target date:   Patient will stand from a sitting position and walk without having to take several seconds Baseline: Goal status: INITIAL  2.  Patient will increase lumbar flexion by 25 degrees Baseline:  Goal status: INITIAL  3.  Patient will increase bilateral  lower extremity strength by 5 pounds Baseline:  Goal status: INITIAL  4.  Patient will be independent with a base exercise program Baseline:  Goal status: INITIAL    LONG TERM GOALS: Target date: 01/27/2023    Patient will sit at her desk through work shift, with standing breaks, without increased pain. Baseline:  Goal status: INITIAL  2.  Patient will stand for 45 minutes without increased pain in order to perform work tasks Baseline:  Goal status: INITIAL  3.  Patient will ambulate community distances without increased pain in order to perform IADLs Baseline:  Goal status: INITIAL  4.  Patient will have complete exercise program to prevent further exacerbation of neck and back pain Baseline:  Goal status: INITIAL  PLAN:  PT FREQUENCY: 2x/week  PT DURATION: 6 weeks  PLANNED INTERVENTIONS: Therapeutic exercises, Therapeutic activity, Neuromuscular re-education, Balance training, Gait training, Patient/Family education, Self Care, Joint mobilization, Aquatic Therapy, Dry Needling, Electrical stimulation, Spinal manipulation, Spinal mobilization, Cryotherapy, Moist heat, Ultrasound, and Manual therapy.  PLAN FOR NEXT  SESSION: Consider LAD and posterior mobilizations at bilateral hips to improve hip flexion.  Patient is close to 95 to 100 degree mark which will reduce her pain and sitting.  Consider soft tissue mobilization to the lumbar spine and upper back/cervical area.  Consider posterior chain exercises.  Consider, rows shoulder, extension bilateral ER next visit.  Consider supine core stability series of exercises next visit.   Carney Living, PT 12/15/2022, 3:21 PM

## 2022-12-16 ENCOUNTER — Encounter (HOSPITAL_BASED_OUTPATIENT_CLINIC_OR_DEPARTMENT_OTHER): Payer: Self-pay

## 2022-12-31 ENCOUNTER — Encounter (HOSPITAL_BASED_OUTPATIENT_CLINIC_OR_DEPARTMENT_OTHER): Payer: Self-pay | Admitting: Emergency Medicine

## 2022-12-31 ENCOUNTER — Emergency Department (HOSPITAL_BASED_OUTPATIENT_CLINIC_OR_DEPARTMENT_OTHER)
Admission: EM | Admit: 2022-12-31 | Discharge: 2022-12-31 | Disposition: A | Discharge status: Home / Self Care | Payer: 59 | Attending: Emergency Medicine | Admitting: Emergency Medicine

## 2022-12-31 ENCOUNTER — Ambulatory Visit (HOSPITAL_BASED_OUTPATIENT_CLINIC_OR_DEPARTMENT_OTHER): Payer: 59

## 2022-12-31 ENCOUNTER — Encounter (HOSPITAL_BASED_OUTPATIENT_CLINIC_OR_DEPARTMENT_OTHER): Payer: Self-pay

## 2022-12-31 ENCOUNTER — Other Ambulatory Visit: Payer: Self-pay

## 2022-12-31 DIAGNOSIS — I10 Essential (primary) hypertension: Secondary | ICD-10-CM | POA: Insufficient documentation

## 2022-12-31 DIAGNOSIS — R590 Localized enlarged lymph nodes: Secondary | ICD-10-CM | POA: Insufficient documentation

## 2022-12-31 DIAGNOSIS — M542 Cervicalgia: Secondary | ICD-10-CM

## 2022-12-31 DIAGNOSIS — M62838 Other muscle spasm: Secondary | ICD-10-CM

## 2022-12-31 DIAGNOSIS — Z79899 Other long term (current) drug therapy: Secondary | ICD-10-CM | POA: Insufficient documentation

## 2022-12-31 DIAGNOSIS — E039 Hypothyroidism, unspecified: Secondary | ICD-10-CM | POA: Diagnosis not present

## 2022-12-31 DIAGNOSIS — M5459 Other low back pain: Secondary | ICD-10-CM

## 2022-12-31 DIAGNOSIS — R2689 Other abnormalities of gait and mobility: Secondary | ICD-10-CM

## 2022-12-31 LAB — BASIC METABOLIC PANEL
Anion gap: 7 (ref 5–15)
BUN: 15 mg/dL (ref 6–20)
CO2: 30 mmol/L (ref 22–32)
Calcium: 8.9 mg/dL (ref 8.9–10.3)
Chloride: 104 mmol/L (ref 98–111)
Creatinine, Ser: 0.8 mg/dL (ref 0.44–1.00)
GFR, Estimated: >60 mL/min (ref >60)
Glucose, Bld: 89 mg/dL (ref 70–99)
Potassium: 4 mmol/L (ref 3.5–5.1)
Sodium: 141 mmol/L (ref 135–145)

## 2022-12-31 LAB — CBC WITH DIFFERENTIAL/PLATELET
Abs Immature Granulocytes: 0.03 10*3/uL (ref 0.00–0.07)
Basophils Absolute: 0 10*3/uL (ref 0.0–0.1)
Basophils Relative: 1 %
Eosinophils Absolute: 0.1 10*3/uL (ref 0.0–0.5)
Eosinophils Relative: 2 %
HCT: 44.1 % (ref 36.0–46.0)
Hemoglobin: 13.8 g/dL (ref 12.0–15.0)
Immature Granulocytes: 0 %
Lymphocytes Relative: 27 %
Lymphs Abs: 2.3 10*3/uL (ref 0.7–4.0)
MCH: 28.1 pg (ref 26.0–34.0)
MCHC: 31.3 g/dL (ref 30.0–36.0)
MCV: 89.8 fL (ref 80.0–100.0)
Monocytes Absolute: 0.5 10*3/uL (ref 0.1–1.0)
Monocytes Relative: 6 %
Neutro Abs: 5.5 10*3/uL (ref 1.7–7.7)
Neutrophils Relative %: 64 %
Platelets: 265 10*3/uL (ref 150–400)
RBC: 4.91 MIL/uL (ref 3.87–5.11)
RDW: 13.5 % (ref 11.5–15.5)
WBC: 8.5 10*3/uL (ref 4.0–10.5)
nRBC: 0 % (ref 0.0–0.2)

## 2022-12-31 LAB — MONONUCLEOSIS SCREEN: Mono Screen: NEGATIVE

## 2022-12-31 LAB — GROUP A STREP BY PCR: Group A Strep by PCR: NOT DETECTED

## 2022-12-31 NOTE — ED Notes (Signed)
Pt given discharge instructions. Opportunities given for questions. Pt verbalizes understanding. PIV removed x1. Lilymae Swiech R, RN 

## 2022-12-31 NOTE — Therapy (Signed)
OUTPATIENT PHYSICAL THERAPY THORACOLUMBAR EVALUATION   Patient Name: Carla Huffman MRN: [id number] DOB:05-Jun-1971, 52 y.o., female Today's Date: 12/31/2022  END OF SESSION:  PT End of Session - 12/31/22 0855     Visit Number 2    Number of Visits 12    Date for PT Re-Evaluation 01/27/23    PT Start Time 0853    PT Stop Time 0930    PT Time Calculation (min) 37 min    Activity Tolerance Patient tolerated treatment well    Behavior During Therapy [facility] for tasks assessed/performed             Past Medical History:  Diagnosis Date   Anxiety    Hypertension    Hypothyroidism    Past Surgical History:  Procedure Laterality Date   CESAREAN SECTION     SVT ABLATION N/A 12/26/2021   Procedure: SVT ABLATION;  Surgeon: Vickie Epley, MD;  Location: [facility];  Service: Cardiovascular;  Laterality: N/A;   THYROIDECTOMY     Patient Active Problem List   Diagnosis Date Noted   Low back pain 12/09/2022   Sinusitis 10/02/2022   Severe obesity (BMI >= 40) (HCC) 10/02/2022   Sore throat 07/27/2022   Dizziness 02/25/2022   Nausea and vomiting 02/05/2022   Hypoparathyroidism ([place]) 12/16/2021   Hypocalcemia 10/20/2021   Hypertension 10/20/2021   Cardiac ischemia 08/28/2021   Supraventricular tachycardia 08/28/2021   Hypothyroidism 06/30/2017   Generalized anxiety disorder 06/30/2017    PCP:  Dr raymond [place]   REFERRING PROVIDER: Dr Arlina Robes [place]   REFERRING DIAG:M54.50 (ICD-10-CM) - Acute bilateral low back pain without sciatica  Rationale for Evaluation and Treatment: Rehabilitation  THERAPY DIAG:  Other low back pain  Cervicalgia  Other abnormalities of gait and mobility  Other muscle spasm  ONSET DATE: 2 months prior   SUBJECTIVE:                                                                                                                                                                                           SUBJECTIVE  STATEMENT: Patient reports continued tightness in mid/low back and upper traps. Reports the pain is worse in her upper trap area. Has been compliant with HEP."It gets sore in the morning after I do my exercises."  PERTINENT HISTORY:  Cervical pain, S SVT, anxiety, remote history of cesarean section.  PAIN:  Are you having pain? Yes: NPRS scale: 5/10 right now; has reached a 10/10  Pain location: Low Back/ Neck and shoulders  Pain description: aching  Aggravating factors: Sitting is the worst; can also feel it when he  is walking Relieving factors: Changing position  PRECAUTIONS: None  WEIGHT BEARING RESTRICTIONS: No  FALLS:  Has patient fallen in last 6 months? No  LIVING ENVIRONMENT: 3 steps into the house  OCCUPATION:  Works at a desk mostly doing data entry  Recreation: Hiking    PLOF: Independent  PATIENT GOALS:  Get the pain under control  Sleeping position: Sleeps on side left  NEXT MD VISIT:    OBJECTIVE:   DIAGNOSTIC FINDINGS:  X-ray: Lumbar Mild degeneration at L1-L2  PATIENT SURVEYS:  FOTO    SCREENING FOR RED FLAGS: Bowel or bladder incontinence: No Spinal tumors: No Cauda equina syndrome: No Compression fracture: No Abdominal aneurysm: No  COGNITION: Overall cognitive status: Within functional limits for tasks assessed     SENSATION: Has begun losing sensation in her pinky  MUSCLE LENGTH:  POSTURE: rounded shoulders, forward head, and flexed trunk   PALPATION: Significant spasming in bilateral lower lumbar paraspinals, bilateral gluteal, bilateral upper traps, and into cervical paraspinals  LUMBAR ROM:   AROM eval  Flexion 30 degrees with pain   Extension Limited past neutral   Right lateral flexion   Left lateral flexion   Right rotation No limit no pain   Left rotation Painful limited 50%    (Blank rows = not tested)  LOWER EXTREMITY ROM:     Passive  Right eval Left eval  Hip flexion 90 degrees with pain 85 degrees with pain   Hip extension    Hip abduction    Hip adduction    Hip internal rotation Painful Painful  Hip external rotation Painful Painful  Knee flexion    Knee extension    Ankle dorsiflexion    Ankle plantarflexion    Ankle inversion    Ankle eversion     (Blank rows = not tested)  CERVICAL ROM:   Active ROM A/PROM (deg) eval  Flexion   Extension   Right lateral flexion   Left lateral flexion   Right rotation 80 degrees with minor pain  Left rotation Full with out pain   (Blank rows = not tested)   LOWER EXTREMITY MMT:    MMT Right eval Left eval  Hip flexion 16.2 16.9  Hip extension    Hip abduction 24.1 16.4  Hip adduction    Hip internal rotation    Hip external rotation    Knee flexion    Knee extension 15.4 27.5  Ankle dorsiflexion    Ankle plantarflexion    Ankle inversion    Ankle eversion     (Blank rows = not tested)  LUMBAR SPECIAL TESTS:   FUNCTIONAL TESTS:  Sit to stand: Stand slowly.  Needs several seconds before taking her initial steps.  GAIT: Decreased bilateral hip flexion, lateral movement with gait. TODAY'S TREATMENT:  DATE: 3/28  -STM bilateral UT/periscapular mm.  -Seated upper trap stretch 30secx3bil -Doorway pec stretch 30secx3 -DKTC with legs on physioball- x15- 5 second hold -LTR with legs on physioball 5 second hold x15ea -sidelying open book- 5second hold- 1x10ea -hooklying PPT- 5" 1x10 -Seated scap squeeze 5" 2x10 -Bilateral ER YTB-2x10   DATE: Eval Exercises - Seated Bilateral Shoulder Flexion Towel Slide at Table Top  - 1 x daily - 7 x weekly - 3 sets - Supine Lower Trunk Rotation  - 1 x daily - 7 x weekly - 3 sets - 10 reps - Theracane Over Shoulder  - 1 x daily - 7 x weekly - 3 sets - 1-2 min  hold - Standing Glute Med Mobilization with Small Ball on Wall  - 1 x daily - 7 x weekly - 3 sets - 10 reps  - 1-2 min  hold   PATIENT EDUCATION:  Education details: HEP, symptom management, movement within pain-free ranges. Person educated: Patient Education method: Explanation, Demonstration, Tactile cues, Verbal cues, and Handouts Education comprehension: verbalized understanding, returned demonstration, verbal cues required, tactile cues required, and needs further education  HOME EXERCISE PROGRAM: Access Code: [email or web address] URL: [email or web address] Date: 12/16/2022 Prepared by: Carolyne Littles  ASSESSMENT:  CLINICAL IMPRESSION: Pt continues with significant tightness and soft tissue tenderness throughout bilateral upper traps, lats, periscap mm, and all paraspinal mm. She responded well to stretching program for various mm. And was provided with updated HEP. Trialed gentle scapular strengthening without complaint.   OBJECTIVE IMPAIRMENTS: Abnormal gait, decreased activity tolerance, difficulty walking, decreased ROM, decreased strength, increased fascial restrictions, improper body mechanics, postural dysfunction, and pain.   ACTIVITY LIMITATIONS: carrying, lifting, bending, sitting, standing, stairs, transfers, bathing, and locomotion level  PARTICIPATION LIMITATIONS: meal prep, cleaning, laundry, driving, shopping, community activity, occupation, and yard work  PERSONAL FACTORS: Fitness and 1-2 comorbidities: anxiety, cervical pain   are also affecting patient's functional outcome.   REHAB POTENTIAL: Good  CLINICAL DECISION MAKING: Evolving/moderate complexity increasing pain throughout her back   EVALUATION COMPLEXITY: Moderate   GOALS: Goals reviewed with patient? No  SHORT TERM GOALS: Target date: 01/12/2023  Patient will stand from a sitting position and walk without having to take several seconds Baseline: Goal status: INITIAL  2.  Patient will increase lumbar flexion by 25 degrees Baseline:  Goal status: INITIAL  3.  Patient will increase bilateral lower  extremity strength by 5 pounds Baseline:  Goal status: INITIAL  4.  Patient will be independent with a base exercise program Baseline:  Goal status: INITIAL    LONG TERM GOALS: Target date: 01/27/2023    Patient will sit at her desk through work shift, with standing breaks, without increased pain. Baseline:  Goal status: INITIAL  2.  Patient will stand for 45 minutes without increased pain in order to perform work tasks Baseline:  Goal status: INITIAL  3.  Patient will ambulate community distances without increased pain in order to perform IADLs Baseline:  Goal status: INITIAL  4.  Patient will have complete exercise program to prevent further exacerbation of neck and back pain Baseline:  Goal status: INITIAL  PLAN:  PT FREQUENCY: 2x/week  PT DURATION: 6 weeks  PLANNED INTERVENTIONS: Therapeutic exercises, Therapeutic activity, Neuromuscular re-education, Balance training, Gait training, Patient/Family education, Self Care, Joint mobilization, Aquatic Therapy, Dry Needling, Electrical stimulation, Spinal manipulation, Spinal mobilization, Cryotherapy, Moist heat, Ultrasound, and Manual therapy.  PLAN FOR NEXT SESSION: Consider LAD and posterior mobilizations at bilateral hips to improve hip flexion.  Patient is close to 95 to 100 degree mark which will reduce her pain and sitting.  Consider soft tissue mobilization to the lumbar spine and upper back/cervical area.  Consider posterior chain exercises.  Consider, rows shoulder, extension bilateral ER next visit.  Consider supine core stability series of exercises next visit.   Graceann Congress Michaelanthony Kempton, PTA 12/31/2022, 2:57 PM

## 2022-12-31 NOTE — ED Notes (Signed)
Pt c/o knot in L upper neck area. Has appointment with MD but wanted to see a MD sooner. No redness or tenderness.

## 2022-12-31 NOTE — ED Triage Notes (Signed)
Pt arrives to left cervical lymphadenopathy x3 months.

## 2022-12-31 NOTE — Discharge Instructions (Addendum)
Thank you for letting us take care of you today.  Your blood work, monoscreen, and strep swab are negative.  As discussed, you will need to follow-up with the ENT for further evaluation of your lymph node.  I have given you information for our ENT on-call if you would like to try and make an earlier appointment. Please follow up with your PCP to discuss your visit today and continued lymphadenopathy. You may need a biopsy of this in the future if no other cause is found.  For any new or worsening symptoms such as fever, vomiting, chest pain, shortness of breath, abdominal pain, other lymph node swelling, or other concerning symptoms, please return to the nearest emergency department for reevaluation.

## 2022-12-31 NOTE — ED Provider Notes (Signed)
[facility] Provider Note   CSN: [id number] Arrival date & time: 12/31/22  [id number]     History  Chief Complaint  Patient presents with   Lymphadenopathy    Carla Huffman is a 52 y.o. female with PMH anxiety, HTN, hypothyroidism s/p thyroidectomy, SVT s/p cardiac ablation who presents to ED c/o lymph node to neck. She states she noticed node on left side of neck over 3 months ago and was referred to ENT specialist but appointment was delayed with scheduling and then required cancellation and rescheduling so she has not yet been able to be seen. At time of lymph node onset, she had upper respiratory symptoms including cough, congestion, headache, and sore throat but these resolved after several days and she has not had them recur since that time. She denies associated fever, chills, nausea, vomiting, weight loss, abdominal pain, or other symptoms including noticing lymphadenopathy in other places. No other recent infection.       Home Medications Prior to Admission medications   Medication Sig Start Date End Date Taking? Authorizing Provider  calcium carbonate (OS-CAL) 600 MG TABS tablet Take 600 mg by mouth daily with breakfast.    [provider]  levothyroxine (SYNTHROID) 25 MCG tablet TAKE 1 TABLET (25 MCG TOTAL) BY MOUTH EVERY OTHER DAY. Patient not taking: Reported on 12/15/2022 06/22/22   [place], Blondell Reveal, MD  levothyroxine (SYNTHROID) 300 MCG tablet Take 1 tablet (300 mcg total) by mouth daily before breakfast. 12/09/22   [place], Blondell Reveal, MD  Levothyroxine Sodium 13 MCG CAPS TAKE 1 CAPSULE (13 MCG TOTAL) BY MOUTH DAILY BEFORE BREAKFAST. Patient not taking: Reported on 12/15/2022 12/17/21   [place], Blondell Reveal, MD  meloxicam (MOBIC) 7.5 MG tablet Take 1 tablet (7.5 mg total) by mouth daily. 12/09/22   [place], Blondell Reveal, MD  metoprolol succinate (TOPROL XL) 25 MG 24 hr tablet Take 1 tablet (25 mg total) by mouth at bedtime. 01/29/22    Vickie Epley, MD  Multiple Vitamin (MULTIVITAMIN WITH MINERALS) TABS tablet Take 1 tablet by mouth daily.    [provider]  ondansetron (ZOFRAN-ODT) 8 MG disintegrating tablet Take 1 tablet (8 mg total) by mouth every 8 (eight) hours as needed for nausea. Patient not taking: Reported on 12/15/2022 02/05/22   [place], Blondell Reveal, MD  tiZANidine (ZANAFLEX) 4 MG tablet Take 1 tablet (4 mg total) by mouth every 8 (eight) hours as needed for muscle spasms. 12/07/22   Barrett Henle, MD  vitamin B-12 (CYANOCOBALAMIN) 1000 MCG tablet Take 1,000 mcg by mouth daily.    [provider]      Allergies    Keflex [cephalexin] and Levofloxacin    Review of Systems   Review of Systems  All other systems reviewed and are negative.   Physical Exam Updated Vital Signs BP (!) 140/98 (BP Location: Left Arm)   Pulse 81   Temp 98.4 F (36.9 C) (Oral)   Resp 16   Ht 5\' 10"  (1.778 m)   Wt 133.8 kg   SpO2 95%   BMI 42.33 kg/m  Physical Exam Vitals and nursing note reviewed.  Constitutional:      General: She is not in acute distress.    Appearance: Normal appearance. She is not ill-appearing, toxic-appearing or diaphoretic.  HENT:     Head: Normocephalic and atraumatic.     Right Ear: Tympanic membrane, ear canal and external ear normal.  Left Ear: Tympanic membrane, ear canal and external ear normal.     Nose: Nose normal.     Mouth/Throat:     Mouth: Mucous membranes are moist.     Dentition: No gingival swelling or dental abscesses.     Pharynx: Oropharynx is clear. Uvula midline. No pharyngeal swelling, oropharyngeal exudate, posterior oropharyngeal erythema or uvula swelling.  Eyes:     General: No scleral icterus.       Right eye: No discharge.        Left eye: No discharge.     Extraocular Movements: Extraocular movements intact.     Conjunctiva/sclera: Conjunctivae normal.     Pupils: Pupils are equal, round, and reactive to light.  Neck:     Trachea:  Trachea and phonation normal.     Comments: ~2cm round submental lymph node on the left that is mobile, no overlying skin changes, nontender, no other significant nodes notes to anterior cervical, submandibular, submental, preauricular, postauricular, or supraclavicular chains Cardiovascular:     Rate and Rhythm: Normal rate and regular rhythm.     Heart sounds: No murmur heard. Pulmonary:     Effort: Pulmonary effort is normal. No respiratory distress.     Breath sounds: Normal breath sounds. No stridor. No wheezing, rhonchi or rales.  Abdominal:     General: Abdomen is flat.     Palpations: Abdomen is soft.     Tenderness: There is no abdominal tenderness. There is no right CVA tenderness, left CVA tenderness, guarding or rebound.  Musculoskeletal:        General: Normal range of motion.     Cervical back: Normal range of motion and neck supple. No rigidity.     Right lower leg: No edema.     Left lower leg: No edema.  Skin:    General: Skin is warm and dry.     Capillary Refill: Capillary refill takes less than 2 seconds.  Neurological:     Mental Status: She is alert. Mental status is at baseline.  Psychiatric:        Behavior: Behavior normal.     ED Results / Procedures / Treatments   Labs (all labs ordered are listed, but only abnormal results are displayed) Labs Reviewed  GROUP A STREP BY PCR  MONONUCLEOSIS SCREEN  CBC WITH DIFFERENTIAL/PLATELET  BASIC METABOLIC PANEL    EKG None  Radiology No results found.  Procedures Procedures    Medications Ordered in ED Medications - No data to display  ED Course/ Medical Decision Making/ A&P                             Medical Decision Making Amount and/or Complexity of Data Reviewed Labs: ordered. Decision-making details documented in ED Course.   Medical Decision Making:   Carla Huffman is a 52 y.o. female who presented to the ED today with lymphadenopathy detailed above.    Patient placed on continuous  vitals and telemetry monitoring while in ED which was reviewed periodically.  Complete initial physical exam performed, notably the patient  was in no acute distress. Lungs clear to auscultation. No evidence of dental, peritonsillar, or retropharyngeal abscess. Abdomen soft and nontender. Normal ENT exam as above. ~2cm round, mobile submental lymph node on the left. No other palpable lymph nodes as documented.  Reviewed and confirmed nursing documentation for past medical history, family history, social history.    Initial Assessment:   With the  patient's presentation of lymphadenopathy, differential diagnosis includes but is not limited to strep pharyngitis, mononucleosis, dental abscess, peritonsillar abscess, retropharyngeal abscess, pneumonia, lymphangitis, mallignancy.  This is most consistent with an acute complicated illness  Initial Plan:  Screening labs including CBC and Metabolic panel to evaluate for infectious or metabolic etiology of disease.  Mono screen Strep swab Objective evaluation as reviewed   Initial Study Results:   Laboratory  All laboratory results reviewed without evidence of clinically relevant pathology.    Radiology:  All images reviewed independently. Agree with radiology report at this time.   DG Lumbar Spine 2-3 Views  Result Date: 12/07/2022 CLINICAL DATA:  Fall.  Low back pain. EXAM: LUMBAR SPINE - 2-3 VIEW COMPARISON:  CT abdomen 10/17/2010 FINDINGS: Body habitus reduces diagnostic sensitivity and specificity. No fracture or acute bony findings noted. Minimal anterior interbody spurring at L1-2 with 2 mm degenerative retrolisthesis at L1-2. IMPRESSION: 1. No fracture or acute bony findings identified. If pain persists despite conservative therapy, MRI may be warranted for further characterization. 2. Mild degenerative findings at L1-2. Electronically Signed   By: Van Clines M.D.   On: 12/07/2022 17:01      Final Assessment and Plan:   This is a 52  year old female presenting to ED for evaluation of lymphadenopathy. Pt reports swollen left submental lymph node for over 3 months, awaiting ENT follow up arranged by PCP. Reports she was scheduled for appointment today but office had to reschedule her for 4/29 so she came to ED for further evaluation. Lymph node first noticed with acute upper respiratory symptoms at onset but these resolved in typical time frame. No weight loss, fevers, nausea, vomiting, abdominal pain, chest pain, shortness of breath, or other associated symptoms. Pt has ~2 cm round, mobile submental lymph node with no overlying skin changes. No other lymphadenopathy identified. No signs of dental, retropharyngeal, or peritonsillar abscess. Lungs clear to auscultation. Pt nontoxic appearing. Slightly hypertensive but otherwise vital signs are stable.  Obtained workup as a for further evaluation.  CBC and metabolic panel unremarkable.  Negative strep and monoscreen.  Discussed case with attending physician who agrees that no further workup is indicated at this time and patient will need to follow-up in the outpatient setting.  Patient has ENT follow-up arranged.  She is requesting an earlier appointment.  No indication for emergent consult or referral at this time.  Discussed with patient that we will give her information for ENT on-call and she can attempt to arrange an earlier appointment if available. Aware she will likely need biopsy in future for further evaluation of lymphadenopathy.  Patient expressed understanding of this.  Strict ED return precautions given, all questions answered, patient stable for discharge.   Clinical Impression:  1. Submental lymphadenopathy      Discharge           Final Clinical Impression(s) / ED Diagnoses Final diagnoses:  Submental lymphadenopathy    Rx / DC Orders ED Discharge Orders     None         Suzzette Righter, PA-C 12/31/22 1140    Pattricia Boss, MD 01/01/23 1714

## 2023-01-06 ENCOUNTER — Other Ambulatory Visit (HOSPITAL_BASED_OUTPATIENT_CLINIC_OR_DEPARTMENT_OTHER): Payer: Self-pay | Admitting: Family Medicine

## 2023-01-06 ENCOUNTER — Ambulatory Visit (HOSPITAL_BASED_OUTPATIENT_CLINIC_OR_DEPARTMENT_OTHER): Payer: 59 | Attending: Family Medicine | Admitting: Physical Therapy

## 2023-01-06 DIAGNOSIS — R2689 Other abnormalities of gait and mobility: Secondary | ICD-10-CM | POA: Diagnosis present

## 2023-01-06 DIAGNOSIS — M5459 Other low back pain: Secondary | ICD-10-CM

## 2023-01-06 DIAGNOSIS — M542 Cervicalgia: Secondary | ICD-10-CM

## 2023-01-06 DIAGNOSIS — M62838 Other muscle spasm: Secondary | ICD-10-CM

## 2023-01-06 NOTE — Therapy (Signed)
OUTPATIENT PHYSICAL THERAPY THORACOLUMBAR EVALUATION   Patient Name: Carla Huffman MRN: [id number] DOB:12/01/1970, 52 y.o., female Today's Date: 01/07/2023  END OF SESSION:  PT End of Session - 01/07/23 1148     Visit Number 3    Number of Visits 12    Date for PT Re-Evaluation 01/27/23    PT Start Time 1600    PT Stop Time [id number]    PT Time Calculation (min) 43 min    Activity Tolerance Patient tolerated treatment well    Behavior During Therapy WFL for tasks assessed/performed              Past Medical History:  Diagnosis Date   Anxiety    Hypertension    Hypothyroidism    Past Surgical History:  Procedure Laterality Date   CESAREAN SECTION     SVT ABLATION N/A 12/26/2021   Procedure: SVT ABLATION;  Surgeon: Vickie Epley, MD;  Location: [facility];  Service: Cardiovascular;  Laterality: N/A;   THYROIDECTOMY     Patient Active Problem List   Diagnosis Date Noted   Low back pain 12/09/2022   Sinusitis 10/02/2022   Severe obesity (BMI >= 40) 10/02/2022   Sore throat 07/27/2022   Dizziness 02/25/2022   Nausea and vomiting 02/05/2022   Hypoparathyroidism 12/16/2021   Hypocalcemia 10/20/2021   Hypertension 10/20/2021   Cardiac ischemia 08/28/2021   Supraventricular tachycardia 08/28/2021   Hypothyroidism 06/30/2017   Generalized anxiety disorder 06/30/2017    PCP:  Dr raymond [place]   REFERRING PROVIDER: Dr Arlina Robes [place]   REFERRING DIAG:M54.50 (ICD-10-CM) - Acute bilateral low back pain without sciatica  Rationale for Evaluation and Treatment: Rehabilitation  THERAPY DIAG:  Other low back pain  Cervicalgia  Other abnormalities of gait and mobility  Other muscle spasm  ONSET DATE: 2 months prior   SUBJECTIVE:                                                                                                                                                                                           SUBJECTIVE STATEMENT: Patient reports  continued tightness in mid/low back and upper traps. Reports the pain is worse in her upper trap area. Has been compliant with HEP."It gets sore in the morning after I do my exercises."  PERTINENT HISTORY:  Cervical pain, S SVT, anxiety, remote history of cesarean section.  PAIN:  Are you having pain? Yes: NPRS scale: 5/10 right now; has reached a 10/10  Pain location: Low Back/ Neck and shoulders  Pain description: aching  Aggravating factors: Sitting is the worst; can also feel it when he is  walking Relieving factors: Changing position  PRECAUTIONS: None  WEIGHT BEARING RESTRICTIONS: No  FALLS:  Has patient fallen in last 6 months? No  LIVING ENVIRONMENT: 3 steps into the house  OCCUPATION:  Works at a desk mostly doing data entry  Recreation: Hiking    PLOF: Independent  PATIENT GOALS:  Get the pain under control  Sleeping position: Sleeps on side left  NEXT MD VISIT:    OBJECTIVE:   DIAGNOSTIC FINDINGS:  X-ray: Lumbar Mild degeneration at L1-L2  PATIENT SURVEYS:  FOTO    SCREENING FOR RED FLAGS: Bowel or bladder incontinence: No Spinal tumors: No Cauda equina syndrome: No Compression fracture: No Abdominal aneurysm: No  COGNITION: Overall cognitive status: Within functional limits for tasks assessed     SENSATION: Has begun losing sensation in her pinky  MUSCLE LENGTH:  POSTURE: rounded shoulders, forward head, and flexed trunk   PALPATION: Significant spasming in bilateral lower lumbar paraspinals, bilateral gluteal, bilateral upper traps, and into cervical paraspinals  LUMBAR ROM:   AROM eval  Flexion 30 degrees with pain   Extension Limited past neutral   Right lateral flexion   Left lateral flexion   Right rotation No limit no pain   Left rotation Painful limited 50%    (Blank rows = not tested)  LOWER EXTREMITY ROM:     Passive  Right eval Left eval  Hip flexion 90 degrees with pain 85 degrees with pain  Hip extension    Hip  abduction    Hip adduction    Hip internal rotation Painful Painful  Hip external rotation Painful Painful  Knee flexion    Knee extension    Ankle dorsiflexion    Ankle plantarflexion    Ankle inversion    Ankle eversion     (Blank rows = not tested)  CERVICAL ROM:   Active ROM A/PROM (deg) eval  Flexion   Extension   Right lateral flexion   Left lateral flexion   Right rotation 80 degrees with minor pain  Left rotation Full with out pain   (Blank rows = not tested)   LOWER EXTREMITY MMT:    MMT Right eval Left eval  Hip flexion 16.2 16.9  Hip extension    Hip abduction 24.1 16.4  Hip adduction    Hip internal rotation    Hip external rotation    Knee flexion    Knee extension 15.4 27.5  Ankle dorsiflexion    Ankle plantarflexion    Ankle inversion    Ankle eversion     (Blank rows = not tested)  LUMBAR SPECIAL TESTS:   FUNCTIONAL TESTS:  Sit to stand: Stand slowly.  Needs several seconds before taking her initial steps.  GAIT: Decreased bilateral hip flexion, lateral movement with gait. TODAY'S TREATMENT:  4/4   Trigger Point Dry-Needling  Treatment instructions: Expect mild to moderate muscle soreness. S/S of pneumothorax if dry needled over a lung field, and to seek immediate medical attention should they occur. Patient verbalized understanding of these instructions and education.  Patient Consent Given: Yes Education handout provided: Yes Muscles treated: left upper trap 2 spots Left cervical paraspinals C6 and C7  Electrical stimulation performed: No Parameters: N/A Treatment response/outcome: great twitch in both muscle groups     Bilateral ER 2x10 yellow  Bilateral horizontal abduction 2x10 yellow  Bilateral flexion  2x10 yellow band DATE: 3/28  -STM bilateral UT/periscapular mm.  -Seated upper trap stretch  30secx3bil -Doorway pec stretch 30secx3 -DKTC with legs on physioball- x15- 5 second hold -LTR with legs on physioball 5 second hold x15ea -sidelying open book- 5second hold- 1x10ea -hooklying PPT- 5" 1x10 -Seated scap squeeze 5" 2x10 -Bilateral ER YTB-2x10   DATE: Eval Exercises - Seated Bilateral Shoulder Flexion Towel Slide at Table Top  - 1 x daily - 7 x weekly - 3 sets - Supine Lower Trunk Rotation  - 1 x daily - 7 x weekly - 3 sets - 10 reps - Theracane Over Shoulder  - 1 x daily - 7 x weekly - 3 sets - 1-2 min  hold - Standing Glute Med Mobilization with Small Ball on Wall  - 1 x daily - 7 x weekly - 3 sets - 10 reps - 1-2 min  hold   PATIENT EDUCATION:  Education details: HEP, symptom management, movement within pain-free ranges. Person educated: Patient Education method: Explanation, Demonstration, Tactile cues, Verbal cues, and Handouts Education comprehension: verbalized understanding, returned demonstration, verbal cues required, tactile cues required, and needs further education  HOME EXERCISE PROGRAM: Access Code: [email or web address] URL: [email or web address] Date: 12/16/2022 Prepared by: Carolyne Littles  ASSESSMENT:  CLINICAL IMPRESSION: Therapy performed a trial of TPDN today on her upper traps and cervical paraspinals. She had a great twitch with all muscle groups. We reviewed a sitting series of posture exercises as well as a mid back stretching routine. She was encouraged to continue to work on figuring out which stretches were the best.  OBJECTIVE IMPAIRMENTS: Abnormal gait, decreased activity tolerance, difficulty walking, decreased ROM, decreased strength, increased fascial restrictions, improper body mechanics, postural dysfunction, and pain.   ACTIVITY LIMITATIONS: carrying, lifting, bending, sitting, standing, stairs, transfers, bathing, and locomotion level  PARTICIPATION LIMITATIONS: meal prep, cleaning, laundry, driving, shopping, community  activity, occupation, and yard work  PERSONAL FACTORS: Fitness and 1-2 comorbidities: anxiety, cervical pain   are also affecting patient's functional outcome.   REHAB POTENTIAL: Good  CLINICAL DECISION MAKING: Evolving/moderate complexity increasing pain throughout her back   EVALUATION COMPLEXITY: Moderate   GOALS: Goals reviewed with patient? No  SHORT TERM GOALS: Target date: 01/12/2023  Patient will stand from a sitting position and walk without having to take several seconds Baseline: Goal status: INITIAL  2.  Patient will increase lumbar flexion by 25 degrees Baseline:  Goal status: INITIAL  3.  Patient will increase bilateral lower extremity strength by 5 pounds Baseline:  Goal status: INITIAL  4.  Patient will be independent with a base exercise program Baseline:  Goal status: INITIAL    LONG TERM GOALS: Target date: 01/27/2023    Patient will sit at her desk through work shift, with standing breaks, without increased pain. Baseline:  Goal status: INITIAL  2.  Patient will stand for 45 minutes without increased pain in order to perform work tasks Baseline:  Goal status: INITIAL  3.  Patient will ambulate community distances without increased pain in order to perform IADLs Baseline:  Goal status: INITIAL  4.  Patient will have complete exercise program to prevent further exacerbation of neck and back pain Baseline:  Goal status: INITIAL  PLAN:  PT FREQUENCY: 2x/week  PT DURATION: 6 weeks  PLANNED INTERVENTIONS: Therapeutic exercises, Therapeutic activity, Neuromuscular re-education, Balance training, Gait training, Patient/Family education, Self Care, Joint mobilization, Aquatic Therapy, Dry Needling, Electrical stimulation, Spinal manipulation, Spinal mobilization, Cryotherapy, Moist heat, Ultrasound, and Manual therapy.  PLAN FOR NEXT SESSION: Consider LAD and posterior mobilizations at bilateral hips to improve hip flexion.  Patient is close to 95  to 100 degree mark which will reduce her pain and sitting.  Consider soft tissue mobilization to the lumbar spine and upper back/cervical area.  Consider posterior chain exercises.  Consider, rows shoulder, extension bilateral ER next visit.  Consider supine core stability series of exercises next visit.   Carney Living, PT 01/07/2023, 11:54 AM

## 2023-01-07 ENCOUNTER — Encounter (HOSPITAL_BASED_OUTPATIENT_CLINIC_OR_DEPARTMENT_OTHER): Payer: Self-pay | Admitting: Physical Therapy

## 2023-01-13 ENCOUNTER — Ambulatory Visit (HOSPITAL_BASED_OUTPATIENT_CLINIC_OR_DEPARTMENT_OTHER): Payer: 59 | Admitting: Physical Therapy

## 2023-01-13 DIAGNOSIS — M5459 Other low back pain: Secondary | ICD-10-CM | POA: Diagnosis not present

## 2023-01-13 DIAGNOSIS — M542 Cervicalgia: Secondary | ICD-10-CM

## 2023-01-13 DIAGNOSIS — M62838 Other muscle spasm: Secondary | ICD-10-CM

## 2023-01-13 DIAGNOSIS — R2689 Other abnormalities of gait and mobility: Secondary | ICD-10-CM

## 2023-01-13 NOTE — Therapy (Signed)
OUTPATIENT PHYSICAL THERAPY THORACOLUMBAR EVALUATION   Patient Name: Carla Huffman MRN: [id number] DOB:[birth year], 52 y.o., female Today's Date: 01/07/2023  END OF SESSION:  PT End of Session - 01/07/23 1148     Visit Number 3    Number of Visits 12    Date for PT Re-Evaluation 01/27/23    PT Start Time 1600    PT Stop Time 1643    PT Time Calculation (min) 43 min    Activity Tolerance Patient tolerated treatment well    Behavior During Therapy WFL for tasks assessed/performed              Past Medical History:  Diagnosis Date   Anxiety    Hypertension    Hypothyroidism    Past Surgical History:  Procedure Laterality Date   CESAREAN SECTION     SVT ABLATION N/A 12/26/2021   Procedure: SVT ABLATION;  Surgeon: Lanier Prude, MD;  Location: [facility];  Service: Cardiovascular;  Laterality: N/A;   THYROIDECTOMY     Patient Active Problem List   Diagnosis Date Noted   Low back pain 12/09/2022   Sinusitis 10/02/2022   Severe obesity (BMI >= 40) 10/02/2022   Sore throat 07/27/2022   Dizziness 02/25/2022   Nausea and vomiting 02/05/2022   Hypoparathyroidism 12/16/2021   Hypocalcemia 10/20/2021   Hypertension 10/20/2021   Cardiac ischemia 08/28/2021   Supraventricular tachycardia 08/28/2021   Hypothyroidism 06/30/2017   Generalized anxiety disorder 06/30/2017    PCP:  Dr raymond De [place]   REFERRING PROVIDER: Dr Ceasar Mons [place]   REFERRING DIAG:M54.50 (ICD-10-CM) - Acute bilateral low back pain without sciatica  Rationale for Evaluation and Treatment: Rehabilitation  THERAPY DIAG:  Other low back pain  Cervicalgia  Other abnormalities of gait and mobility  Other muscle spasm  ONSET DATE: 2 months prior   SUBJECTIVE:                                                                                                                                                                                           SUBJECTIVE STATEMENT: Patient reports  that she is feeling better than last visit but still not the best. Reports that dry needle helped last week. Patient reports that her Left low back is worse than the right side. Patient states that she has tight Left upper traps and left glute.  PERTINENT HISTORY:  Cervical pain, S SVT, anxiety, remote history of cesarean section.  PAIN:  Are you having pain? Yes: NPRS scale: 5/10 right now; has reached a 10/10  Pain location: Low Back/ Neck and shoulders  Pain description: aching  Aggravating  factors: Sitting is the worst; can also feel it when he is walking Relieving factors: Changing position  PRECAUTIONS: None  WEIGHT BEARING RESTRICTIONS: No  FALLS:  Has patient fallen in last 6 months? No  LIVING ENVIRONMENT: 3 steps into the house  OCCUPATION:  Works at a desk mostly doing data entry  Recreation: Hiking    PLOF: Independent  PATIENT GOALS:  Get the pain under control  Sleeping position: Sleeps on side left  NEXT MD VISIT:    OBJECTIVE:   DIAGNOSTIC FINDINGS:  X-ray: Lumbar Mild degeneration at L1-L2  PATIENT SURVEYS:  FOTO    SCREENING FOR RED FLAGS: Bowel or bladder incontinence: No Spinal tumors: No Cauda equina syndrome: No Compression fracture: No Abdominal aneurysm: No  COGNITION: Overall cognitive status: Within functional limits for tasks assessed     SENSATION: Has begun losing sensation in her pinky  MUSCLE LENGTH:  POSTURE: rounded shoulders, forward head, and flexed trunk   PALPATION: Significant spasming in bilateral lower lumbar paraspinals, bilateral gluteal, bilateral upper traps, and into cervical paraspinals  LUMBAR ROM:   AROM eval  Flexion 30 degrees with pain   Extension Limited past neutral   Right lateral flexion   Left lateral flexion   Right rotation No limit no pain   Left rotation Painful limited 50%    (Blank rows = not tested)  LOWER EXTREMITY ROM:     Passive  Right eval Left eval  Hip flexion 90 degrees  with pain 85 degrees with pain  Hip extension    Hip abduction    Hip adduction    Hip internal rotation Painful Painful  Hip external rotation Painful Painful  Knee flexion    Knee extension    Ankle dorsiflexion    Ankle plantarflexion    Ankle inversion    Ankle eversion     (Blank rows = not tested)  CERVICAL ROM:   Active ROM A/PROM (deg) eval  Flexion   Extension   Right lateral flexion   Left lateral flexion   Right rotation 80 degrees with minor pain  Left rotation Full with out pain   (Blank rows = not tested)   LOWER EXTREMITY MMT:    MMT Right eval Left eval  Hip flexion 16.2 16.9  Hip extension    Hip abduction 24.1 16.4  Hip adduction    Hip internal rotation    Hip external rotation    Knee flexion    Knee extension 15.4 27.5  Ankle dorsiflexion    Ankle plantarflexion    Ankle inversion    Ankle eversion     (Blank rows = not tested)  LUMBAR SPECIAL TESTS:   FUNCTIONAL TESTS:  Sit to stand: Stand slowly.  Needs several seconds before taking her initial steps.  GAIT: Decreased bilateral hip flexion, lateral movement with gait. TODAY'S TREATMENT:  4/4   Trigger Point Dry-Needling  Treatment instructions: Expect mild to moderate muscle soreness. S/S of pneumothorax if dry needled over a lung field, and to seek immediate medical attention should they occur. Patient verbalized understanding of these instructions and education.  Patient Consent Given: Yes Education handout provided: Yes Muscles treated: left upper trap 2 spots Left cervical paraspinals C6 and C7  Electrical stimulation performed: No Parameters: N/A Treatment response/outcome: great twitch in both muscle groups     Bilateral ER 2x10 yellow  Bilateral horizontal abduction 2x10 yellow  Bilateral flexion  2x10 yellow band DATE: 3/28  -STM bilateral  UT/periscapular mm.  -Seated upper trap stretch 30secx3bil -Doorway pec stretch 30secx3 -DKTC with legs on physioball- x15- 5 second hold -LTR with legs on physioball 5 second hold x15ea -sidelying open book- 5second hold- 1x10ea -hooklying PPT- 5" 1x10 -Seated scap squeeze 5" 2x10 -Bilateral ER YTB-2x10   DATE: Eval Exercises - Seated Bilateral Shoulder Flexion Towel Slide at Table Top  - 1 x daily - 7 x weekly - 3 sets - Supine Lower Trunk Rotation  - 1 x daily - 7 x weekly - 3 sets - 10 reps - Theracane Over Shoulder  - 1 x daily - 7 x weekly - 3 sets - 1-2 min  hold - Standing Glute Med Mobilization with Small Ball on Wall  - 1 x daily - 7 x weekly - 3 sets - 10 reps - 1-2 min  hold   PATIENT EDUCATION:  Education details: HEP, symptom management, movement within pain-free ranges. Person educated: Patient Education method: Explanation, Demonstration, Tactile cues, Verbal cues, and Handouts Education comprehension: verbalized understanding, returned demonstration, verbal cues required, tactile cues required, and needs further education  HOME EXERCISE PROGRAM: Access Code: [email or web address] URL: [email or web address] Date: 12/16/2022 Prepared by: Lorayne Benderavid Versia Mignogna  ASSESSMENT:  CLINICAL IMPRESSION: Therapy performed a trial of TPDN today on her upper traps and cervical paraspinals. She had a great twitch with all muscle groups. We reviewed a sitting series of posture exercises as well as a mid back stretching routine. She was encouraged to continue to work on figuring out which stretches were the best.  OBJECTIVE IMPAIRMENTS: Abnormal gait, decreased activity tolerance, difficulty walking, decreased ROM, decreased strength, increased fascial restrictions, improper body mechanics, postural dysfunction, and pain.   ACTIVITY LIMITATIONS: carrying, lifting, bending, sitting, standing, stairs, transfers, bathing, and locomotion level  PARTICIPATION LIMITATIONS: meal prep,  cleaning, laundry, driving, shopping, community activity, occupation, and yard work  PERSONAL FACTORS: Fitness and 1-2 comorbidities: anxiety, cervical pain   are also affecting patient's functional outcome.   REHAB POTENTIAL: Good  CLINICAL DECISION MAKING: Evolving/moderate complexity increasing pain throughout her back   EVALUATION COMPLEXITY: Moderate   GOALS: Goals reviewed with patient? No  SHORT TERM GOALS: Target date: 01/12/2023  Patient will stand from a sitting position and walk without having to take several seconds Baseline: Goal status: INITIAL  2.  Patient will increase lumbar flexion by 25 degrees Baseline:  Goal status: INITIAL  3.  Patient will increase bilateral lower extremity strength by 5 pounds Baseline:  Goal status: INITIAL  4.  Patient will be independent with a base exercise program Baseline:  Goal status: INITIAL    LONG TERM GOALS: Target date: 01/27/2023    Patient will sit at her desk through work shift, with standing breaks, without increased pain. Baseline:  Goal status: INITIAL  2.  Patient will stand for 45 minutes without increased pain in order to perform work tasks Baseline:  Goal status: INITIAL  3.  Patient will ambulate community distances without increased pain in order to perform IADLs Baseline:  Goal status: INITIAL  4.  Patient will have complete exercise program to prevent further exacerbation of neck and back pain Baseline:  Goal status: INITIAL  PLAN:  PT FREQUENCY: 2x/week  PT DURATION: 6 weeks  PLANNED INTERVENTIONS: Therapeutic exercises, Therapeutic activity, Neuromuscular re-education, Balance training, Gait training, Patient/Family education, Self Care, Joint mobilization, Aquatic Therapy, Dry Needling, Electrical stimulation, Spinal manipulation, Spinal mobilization, Cryotherapy, Moist heat, Ultrasound, and Manual therapy.  PLAN FOR NEXT SESSION: Consider LAD and posterior mobilizations at bilateral hips  to improve hip flexion.  Patient is close to 95 to 100 degree mark which will reduce her pain and sitting.  Consider soft tissue mobilization to the lumbar spine and upper back/cervical area.  Consider posterior chain exercises.  Consider, rows shoulder, extension bilateral ER next visit.  Consider supine core stability series of exercises next visit.   Dessie Coma, PT 01/07/2023, 11:54 AM

## 2023-01-14 ENCOUNTER — Encounter (HOSPITAL_BASED_OUTPATIENT_CLINIC_OR_DEPARTMENT_OTHER): Payer: Self-pay | Admitting: Physical Therapy

## 2023-01-20 ENCOUNTER — Ambulatory Visit (HOSPITAL_BASED_OUTPATIENT_CLINIC_OR_DEPARTMENT_OTHER): Payer: 59 | Admitting: Physical Therapy

## 2023-01-24 NOTE — Assessment & Plan Note (Signed)
Recent TSH found to be low normal range.  Clinically, patient is euthyroid, she denies any temperature intolerances, no changes in bowel habits, no notable weight changes, no palpitations or elevated heart rate reported. Discussed options pertaining to recent laboratory findings, we will proceed with slight adjustment in levothyroxine dose, patient previously was taking 325 mcg daily, we will adjust this down to 300 mcg daily, refill sent to pharmacy Will plan to recheck TSH in about 6 to 8 weeks to monitor progress and determine if any further changes in dosage are needed

## 2023-01-24 NOTE — Assessment & Plan Note (Signed)
Patient seen a couple days ago at local urgent care.  At that time, she had a few day history of low back pain.  She had some improvement with use of Tylenol, however her pain returned and remained persistent.  She had some radiation into left lateral upper thigh.  Imaging completed at urgent care with degenerative changes noted, primarily at L1-2.  At urgent care, patient did receive Toradol IM and prescription for Toradol to take by mouth.  She was also provided with muscle relaxer.  Today she reports that she continues to have some symptoms, did get some relief with use of Toradol, muscle relaxer has been less effective.  No new symptoms since visit to the urgent care, no new bowel or bladder issues, no new radicular symptoms, no associated numbness or tingling. On exam, patient is in no acute distress.  No tenderness to palpation over spinous processes within lumbar region, mild tenderness to palpation through left paraspinal muscles in lumbar region.  Distal neurovascular exam is intact. Discussed options.  Can continue with as needed use of NSAID and/or Tylenol.  Would also recommend further evaluation with physical therapy, patient amenable to this, referral placed today.  Advised on home exercise program as per PT Will plan to follow-up in about 6 to 8 weeks to monitor progress.  If symptoms do persist or if any worsening does occur, we would consider proceeding with advanced imaging, likely with MRI

## 2023-01-27 ENCOUNTER — Ambulatory Visit (HOSPITAL_BASED_OUTPATIENT_CLINIC_OR_DEPARTMENT_OTHER): Payer: 59 | Admitting: Physical Therapy

## 2023-01-27 DIAGNOSIS — M5459 Other low back pain: Secondary | ICD-10-CM

## 2023-01-27 DIAGNOSIS — M542 Cervicalgia: Secondary | ICD-10-CM

## 2023-01-27 DIAGNOSIS — R2689 Other abnormalities of gait and mobility: Secondary | ICD-10-CM

## 2023-01-27 DIAGNOSIS — M62838 Other muscle spasm: Secondary | ICD-10-CM

## 2023-01-27 NOTE — Therapy (Addendum)
OUTPATIENT PHYSICAL THERAPY THORACOLUMBAR  Treatment/ Progress note/Discharge    Patient Name: Carla Huffman MRN: [id number] DOB:[birth year], 52 y.o., female Today's Date: 01/28/2023  END OF SESSION:  PT End of Session - 01/27/23 1604     Visit Number 5    Number of Visits 12    Date for PT Re-Evaluation 01/27/23    PT Start Time 1600    PT Stop Time 1643    PT Time Calculation (min) 43 min    Activity Tolerance Patient tolerated treatment well    Behavior During Therapy WFL for tasks assessed/performed              Past Medical History:  Diagnosis Date   Anxiety    Hypertension    Hypothyroidism    Past Surgical History:  Procedure Laterality Date   CESAREAN SECTION     SVT ABLATION N/A 12/26/2021   Procedure: SVT ABLATION;  Surgeon: Lanier Prude, MD;  Location: [facility];  Service: Cardiovascular;  Laterality: N/A;   THYROIDECTOMY     Patient Active Problem List   Diagnosis Date Noted   Low back pain 12/09/2022   Sinusitis 10/02/2022   Severe obesity (BMI >= 40) 10/02/2022   Sore throat 07/27/2022   Dizziness 02/25/2022   Nausea and vomiting 02/05/2022   Hypoparathyroidism 12/16/2021   Hypocalcemia 10/20/2021   Hypertension 10/20/2021   Cardiac ischemia 08/28/2021   Supraventricular tachycardia 08/28/2021   Hypothyroidism 06/30/2017   Generalized anxiety disorder 06/30/2017    PCP:  Dr raymond [place]   REFERRING PROVIDER: Dr Ceasar Mons [place]   REFERRING DIAG:M54.50 (ICD-10-CM) - Acute bilateral low back pain without sciatica  Rationale for Evaluation and Treatment: Rehabilitation  THERAPY DIAG:  Other low back pain  Other muscle spasm  Cervicalgia  Other abnormalities of gait and mobility  ONSET DATE: 2 months prior   SUBJECTIVE:                                                                                                                                                                                           SUBJECTIVE  STATEMENT: Patient reports that she is feeling better than last visit but still not the best. Reports that dry needle helped last week. Patient reports that her Left low back is worse than the right side. Patient states that she has tight Left upper traps and left glute. She also reports the time that she was away at the beach she had no pain.   PERTINENT HISTORY:  Cervical pain, S SVT, anxiety, remote history of cesarean section.  PAIN:  Are you having pain? Yes: NPRS scale:  5/10 right now; has reached a 10/10  Pain location: Low Back/ Neck and shoulders  Pain description: aching  Aggravating factors: Sitting is the worst; can also feel it when he is walking Relieving factors: Changing position  PRECAUTIONS: None  WEIGHT BEARING RESTRICTIONS: No  FALLS:  Has patient fallen in last 6 months? No  LIVING ENVIRONMENT: 3 steps into the house  OCCUPATION:  Works at a desk mostly doing data entry  Recreation: Hiking    PLOF: Independent  PATIENT GOALS:  Get the pain under control  Sleeping position: Sleeps on side left  NEXT MD VISIT:    OBJECTIVE:   DIAGNOSTIC FINDINGS:  X-ray: Lumbar Mild degeneration at L1-L2  PATIENT SURVEYS:  FOTO    SCREENING FOR RED FLAGS: Bowel or bladder incontinence: No Spinal tumors: No Cauda equina syndrome: No Compression fracture: No Abdominal aneurysm: No  COGNITION: Overall cognitive status: Within functional limits for tasks assessed     SENSATION: Has begun losing sensation in her pinky  MUSCLE LENGTH:  POSTURE: rounded shoulders, forward head, and flexed trunk   PALPATION: Significant spasming in bilateral lower lumbar paraspinals, bilateral gluteal, bilateral upper traps, and into cervical paraspinals  LUMBAR ROM:   AROM eval  Flexion 30 degrees with pain   Extension Limited past neutral   Right lateral flexion   Left lateral flexion   Right rotation No limit no pain   Left rotation Painful limited 50%    (Blank  rows = not tested)  LOWER EXTREMITY ROM:     Passive  Right eval Left eval  Hip flexion 90 degrees with pain 85 degrees with pain  Hip extension    Hip abduction    Hip adduction    Hip internal rotation Painful Painful  Hip external rotation Painful Painful  Knee flexion    Knee extension    Ankle dorsiflexion    Ankle plantarflexion    Ankle inversion    Ankle eversion     (Blank rows = not tested)  CERVICAL ROM:   Active ROM A/PROM (deg) eval  Flexion   Extension   Right lateral flexion   Left lateral flexion   Right rotation 80 degrees with minor pain  Left rotation Full with out pain   (Blank rows = not tested)   LOWER EXTREMITY MMT:    MMT Right eval Left eval Right 4/24 left 4/24  Hip flexion 16.2 16.9 27.2 20.9  Hip extension      Hip abduction 24.1 16.4 48.3 42.6  Hip adduction      Hip internal rotation      Hip external rotation      Knee flexion      Knee extension 15.4 27.5  57  Ankle dorsiflexion      Ankle plantarflexion      Ankle inversion      Ankle eversion       (Blank rows = not tested)  LUMBAR SPECIAL TESTS:   FUNCTIONAL TESTS:  Sit to stand: Stand slowly.  Needs several seconds before taking her initial steps.  GAIT: Decreased bilateral hip flexion, lateral movement with gait. TODAY'S TREATMENT:  4/25 Trigger Point Dry-Needling  Treatment instructions: Expect mild to moderate muscle soreness. S/S of pneumothorax if dry needled over a lung field, and to seek immediate medical attention should they occur. Patient verbalized understanding of these instructions and education.  Patient Consent Given: Yes Education handout provided: Yes Muscles treated: left lumbar paraspinal L4 and L5 Left upper gluteal 2x using a.75x30  Electrical stimulation performed: No Parameters: N/A Treatment response/outcome: great  twitch in both muscle groups    Manual: trigger point release to gluteal, left lumbar paraspinal; upper trap   Bilateral ER 2x10  Horizontal abduction 2x10  All with green   Row 2x15 green  Shoulder extension 2x15 green     4/11 Trigger Point Dry-Needling  Treatment instructions: Expect mild to moderate muscle soreness. S/S of pneumothorax if dry needled over a lung field, and to seek immediate medical attention should they occur. Patient verbalized understanding of these instructions and education.  Patient Consent Given: Yes Education handout provided: Yes Muscles treated: left upper trap 2 spots Left cervical paraspinals C6 and C7; left L5 paraspinal and t-12  Electrical stimulation performed: No Parameters: N/A Treatment response/outcome: great twitch in both muscle groups    Manual: trigger point release to gluteal, left lumbar paraspinal; upper trap   Bilateral ER 2x10  Horizontal abduction 2x10  D2 extension x10 right x5 right red     Ball flexion stretch 3x10 sec hold  Lateral 5x10 sec hold       4/4   Trigger Point Dry-Needling  Treatment instructions: Expect mild to moderate muscle soreness. S/S of pneumothorax if dry needled over a lung field, and to seek immediate medical attention should they occur. Patient verbalized understanding of these instructions and education.  Patient Consent Given: Yes Education handout provided: Yes Muscles treated: left upper trap 2 spots Left cervical paraspinals C6 and C7  Electrical stimulation performed: No Parameters: N/A Treatment response/outcome: great twitch in both muscle groups     Bilateral ER 2x10 yellow  Bilateral horizontal abduction 2x10 yellow  Bilateral flexion  2x10 yellow band DATE: 3/28  -STM bilateral UT/periscapular mm.  -Seated upper trap stretch 30secx3bil -Doorway pec stretch 30secx3 -DKTC with legs on physioball- x15- 5 second hold -LTR with legs on physioball 5 second hold  x15ea -sidelying open book- 5second hold- 1x10ea -hooklying PPT- 5" 1x10 -Seated scap squeeze 5" 2x10 -Bilateral ER YTB-2x10   DATE: Eval Exercises - Seated Bilateral Shoulder Flexion Towel Slide at Table Top  - 1 x daily - 7 x weekly - 3 sets - Supine Lower Trunk Rotation  - 1 x daily - 7 x weekly - 3 sets - 10 reps - Theracane Over Shoulder  - 1 x daily - 7 x weekly - 3 sets - 1-2 min  hold - Standing Glute Med Mobilization with Small Ball on Wall  - 1 x daily - 7 x weekly - 3 sets - 10 reps - 1-2 min  hold   PATIENT EDUCATION:  Education details: HEP, symptom management, movement within pain-free ranges. Person educated: Patient Education method: Explanation, Demonstration, Tactile cues, Verbal cues, and Handouts Education comprehension: verbalized understanding, returned demonstration, verbal cues required, tactile cues required, and needs further education  HOME EXERCISE PROGRAM: Access Code: [email or web address] URL: [email or web address] Date: 12/16/2022 Prepared by: Lorayne Bender  ASSESSMENT:  CLINICAL IMPRESSION: Overall the patient is making progress. She continues to have episodes of pain especially when she works at a desk. She recently adjusted her desk so she can stand. Her strengthe  has improved. Her FOTO score is progressing towards her goal. She was encouraged to continue being consistent and persistent with her exercises. We will contine 1W6 to progress her exercises and continue manual work/dry needling.     OBJECTIVE IMPAIRMENTS: Abnormal gait, decreased activity tolerance, difficulty walking, decreased ROM, decreased strength, increased fascial restrictions, improper body mechanics, postural dysfunction, and pain.   ACTIVITY LIMITATIONS: carrying, lifting, bending, sitting, standing, stairs, transfers, bathing, and locomotion level  PARTICIPATION LIMITATIONS: meal prep, cleaning, laundry, driving, shopping, community activity, occupation, and yard  work  PERSONAL FACTORS: Fitness and 1-2 comorbidities: anxiety, cervical pain   are also affecting patient's functional outcome.   REHAB POTENTIAL: Good  CLINICAL DECISION MAKING: Evolving/moderate complexity increasing pain throughout her back   EVALUATION COMPLEXITY: Moderate   GOALS: Goals reviewed with patient? No  SHORT TERM GOALS: Target date: 01/12/2023  Patient will stand from a sitting position and walk without having to take several seconds Baseline: Goal status: is doing much better wt this achieved 4/24  2.  Patient will increase lumbar flexion by 25 degrees Baseline:  Goal status: not tested 4/25  3.  Patient will increase bilateral lower extremity strength by 5 pounds Baseline:  Goal status: archived goal/ goal revised 5 lbs since most recent numbers   4.  Patient will be independent with a base exercise program Baseline:  Goal status:achieved 4/25    LONG TERM GOALS: Target date: 01/27/2023    Patient will sit at her desk through work shift, with standing breaks, without increased pain. Baseline:  Goal status: INITIAL  2.  Patient will stand for 45 minutes without increased pain in order to perform work tasks Baseline:  Goal status: INITIAL  3.  Patient will ambulate community distances without increased pain in order to perform IADLs Baseline:  Goal status: INITIAL  4.  Patient will have complete exercise program to prevent further exacerbation of neck and back pain Baseline:  Goal status: INITIAL  PLAN:  PT FREQUENCY: 2x/week  PT DURATION: 6 weeks  PLANNED INTERVENTIONS: Therapeutic exercises, Therapeutic activity, Neuromuscular re-education, Balance training, Gait training, Patient/Family education, Self Care, Joint mobilization, Aquatic Therapy, Dry Needling, Electrical stimulation, Spinal manipulation, Spinal mobilization, Cryotherapy, Moist heat, Ultrasound, and Manual therapy.  PLAN FOR NEXT SESSION: Consider LAD and posterior  mobilizations at bilateral hips to improve hip flexion.  Patient is close to 95 to 100 degree mark which will reduce her pain and sitting.  Consider soft tissue mobilization to the lumbar spine and upper back/cervical area.  Consider posterior chain exercises.  Consider, rows shoulder, extension bilateral ER next visit.  Consider supine core stability series of exercises next visit.   Dessie Coma, PT 01/28/2023, 10:12 AM   Cristal Ford PT DPT   PHYSICAL THERAPY DISCHARGE SUMMARY  Visits from Start of Care: 5  Current functional level related to goals / functional outcomes: Mild improvements    Remaining deficits: Pain at times    Education / Equipment: HEP    Patient agrees to discharge. Patient goals were not met Patient is being discharged due to  not meeting the stated rehab goals.   During this treatment session, the therapist was present, participating in and directing the treatment.   I have reviewed and concur with this student's documentation.

## 2023-02-01 ENCOUNTER — Other Ambulatory Visit: Payer: Self-pay | Admitting: Otolaryngology

## 2023-02-01 DIAGNOSIS — R590 Localized enlarged lymph nodes: Secondary | ICD-10-CM

## 2023-02-01 DIAGNOSIS — K1120 Sialoadenitis, unspecified: Secondary | ICD-10-CM

## 2023-02-01 DIAGNOSIS — K029 Dental caries, unspecified: Secondary | ICD-10-CM

## 2023-02-10 ENCOUNTER — Other Ambulatory Visit (HOSPITAL_BASED_OUTPATIENT_CLINIC_OR_DEPARTMENT_OTHER): Payer: Self-pay | Admitting: Family Medicine

## 2023-02-11 ENCOUNTER — Other Ambulatory Visit (HOSPITAL_BASED_OUTPATIENT_CLINIC_OR_DEPARTMENT_OTHER): Payer: Self-pay

## 2023-02-11 ENCOUNTER — Encounter (HOSPITAL_BASED_OUTPATIENT_CLINIC_OR_DEPARTMENT_OTHER): Payer: 59

## 2023-02-15 ENCOUNTER — Ambulatory Visit (HOSPITAL_BASED_OUTPATIENT_CLINIC_OR_DEPARTMENT_OTHER): Payer: 59 | Admitting: Family Medicine

## 2023-02-15 ENCOUNTER — Encounter (HOSPITAL_BASED_OUTPATIENT_CLINIC_OR_DEPARTMENT_OTHER): Payer: Self-pay | Admitting: Family Medicine

## 2023-02-15 VITALS — BP 137/76 | HR 76 | Temp 97.7°F | Ht 70.0 in | Wt 308.0 lb

## 2023-02-15 DIAGNOSIS — E039 Hypothyroidism, unspecified: Secondary | ICD-10-CM | POA: Diagnosis not present

## 2023-02-15 NOTE — Assessment & Plan Note (Signed)
At last visit, dose of levothyroxine was adjusted from 325 mcg to 300 mcg daily.  She reports that she has been doing well on adjusted dose.  Denies any issues with temperature intolerance, palpitations, changes in bowel habits.  She does have weight concerns - this has been discussed previously.  She was noted to have weight fluctuations in recent times which has corresponded to changes in weight with weight loss noted when dose of levothyroxine was slightly too high and weight gain noted by dose of levothyroxine was slightly too low. She is due for recheck of TSH today to assess progress with new dose of levothyroxine, lab to be checked today

## 2023-02-15 NOTE — Progress Notes (Signed)
    Procedures performed today:    None.  Independent interpretation of notes and tests performed by another provider:   None.  Brief History, Exam, Impression, and Recommendations:    BP 137/76 (BP Location: Left Arm, Patient Position: Sitting, Cuff Size: Large)   Pulse 76   Temp 97.7 F (36.5 C) (Oral)   Ht 5\' 10"  (1.778 m)   Wt (!) 308 lb (139.7 kg)   SpO2 98%   BMI 44.19 kg/m   Hypothyroidism At last visit, dose of levothyroxine was adjusted from 325 mcg to 300 mcg daily.  She reports that she has been doing well on adjusted dose.  Denies any issues with temperature intolerance, palpitations, changes in bowel habits.  She does have weight concerns - this has been discussed previously.  She was noted to have weight fluctuations in recent times which has corresponded to changes in weight with weight loss noted when dose of levothyroxine was slightly too high and weight gain noted by dose of levothyroxine was slightly too low. She is due for recheck of TSH today to assess progress with new dose of levothyroxine, lab to be checked today  Severe obesity (BMI >= 40) (HCC) As above, patient has had variability in TSH based on levothyroxine dosing.  She is due for recheck of TSH today.  We again discussed considerations, previously reviewed GLP-1 receptor agonist and specifically discussed possibility of initiating Wegovy.  Patient does still have an interest in this.  At this time, we will check TSH as above.  If TSH is normal, would look to proceed with [facility].  Discussed general considerations related to this and gradual dose titration as long as medication is tolerated.  If TSH is outside of normal range, would continue to focus on making adjustments in this regard prior to initiation of Wegovy.  Return if symptoms worsen or fail to improve.  And to arrange next appointment based upon results of labs.  If TSH is normal and initiating Wegovy, plan to follow-up in 1 month.  If needing to  adjust levothyroxine, will plan for follow-up in about 6 to 8 weeks after dose change of levothyroxine   ___________________________________________ Neveyah Garzon de [place], MD, ABFM, CAQSM Primary Care and Sports Medicine [facility]

## 2023-02-15 NOTE — Assessment & Plan Note (Signed)
As above, patient has had variability in TSH based on levothyroxine dosing.  She is due for recheck of TSH today.  We again discussed considerations, previously reviewed GLP-1 receptor agonist and specifically discussed possibility of initiating Wegovy.  Patient does still have an interest in this.  At this time, we will check TSH as above.  If TSH is normal, would look to proceed with [facility].  Discussed general considerations related to this and gradual dose titration as long as medication is tolerated.  If TSH is outside of normal range, would continue to focus on making adjustments in this regard prior to initiation of Wegovy.

## 2023-02-17 LAB — TSH: TSH: 0.12 u[IU]/mL — ABNORMAL LOW (ref 0.450–4.500)

## 2023-02-19 ENCOUNTER — Encounter (HOSPITAL_BASED_OUTPATIENT_CLINIC_OR_DEPARTMENT_OTHER): Payer: 59

## 2023-02-26 ENCOUNTER — Encounter (HOSPITAL_BASED_OUTPATIENT_CLINIC_OR_DEPARTMENT_OTHER): Payer: 59 | Admitting: Physical Therapy

## 2023-02-27 ENCOUNTER — Other Ambulatory Visit: Payer: Self-pay | Admitting: Cardiology

## 2023-03-05 ENCOUNTER — Encounter (HOSPITAL_BASED_OUTPATIENT_CLINIC_OR_DEPARTMENT_OTHER): Payer: 59 | Admitting: Physical Therapy

## 2023-03-08 ENCOUNTER — Ambulatory Visit
Admission: RE | Admit: 2023-03-08 | Discharge: 2023-03-08 | Disposition: A | Discharge status: Home / Self Care | Payer: 59 | Source: Ambulatory Visit | Attending: Otolaryngology | Admitting: Otolaryngology

## 2023-03-08 DIAGNOSIS — R590 Localized enlarged lymph nodes: Secondary | ICD-10-CM

## 2023-03-08 DIAGNOSIS — K1120 Sialoadenitis, unspecified: Secondary | ICD-10-CM

## 2023-03-08 DIAGNOSIS — K029 Dental caries, unspecified: Secondary | ICD-10-CM

## 2023-03-08 MED ORDER — IOPAMIDOL (ISOVUE-300) INJECTION 61%
75.0000 mL | Freq: Once | INTRAVENOUS | Status: AC | PRN
  Administered 2023-03-08: 75 mL via INTRAVENOUS

## 2023-03-25 ENCOUNTER — Other Ambulatory Visit (HOSPITAL_COMMUNITY): Payer: Self-pay | Admitting: Otolaryngology

## 2023-03-25 DIAGNOSIS — R590 Localized enlarged lymph nodes: Secondary | ICD-10-CM

## 2023-03-25 DIAGNOSIS — G939 Disorder of brain, unspecified: Secondary | ICD-10-CM

## 2023-03-26 ENCOUNTER — Other Ambulatory Visit (HOSPITAL_COMMUNITY): Payer: Self-pay | Admitting: Otolaryngology

## 2023-03-26 DIAGNOSIS — R590 Localized enlarged lymph nodes: Secondary | ICD-10-CM

## 2023-03-29 NOTE — Progress Notes (Signed)
Oley Balm, MD  Leodis Rains D PROCEDURE / BIOPSY REVIEW Date: 03/29/23  Requested Biopsy site: R cerv LAN Reason for request: ASSESSMENT/PLAN:   1. Cervical lymphadenopathy 2. Cerebellar lesion   Recommended referral for US Guided Biopsy of right cervical lymph node.  Imaging review: Best seen on CT 03/08/23 Im 46 Se 3  Decision: Approved Imaging modality to perform: Ultrasound Schedule with: Patient preference (Local vs Mod Sed) Schedule for: Any VIR  Additional comments:   Please contact me with questions, concerns, or if issue pertaining to this request arise.  Dayne Oley Balm, MD [facility]

## 2023-04-09 ENCOUNTER — Ambulatory Visit (HOSPITAL_COMMUNITY)
Admission: RE | Admit: 2023-04-09 | Discharge: 2023-04-09 | Disposition: A | Discharge status: Home / Self Care | Payer: 59 | Source: Ambulatory Visit | Attending: Otolaryngology | Admitting: Otolaryngology

## 2023-04-09 DIAGNOSIS — G939 Disorder of brain, unspecified: Secondary | ICD-10-CM | POA: Diagnosis present

## 2023-04-09 DIAGNOSIS — R590 Localized enlarged lymph nodes: Secondary | ICD-10-CM | POA: Insufficient documentation

## 2023-04-09 MED ORDER — GADOPICLENOL 0.5 MMOL/ML IV SOLN
10.0000 mL | Freq: Once | INTRAVENOUS | Status: AC | PRN
  Administered 2023-04-09: 10 mL via INTRAVENOUS

## 2023-04-13 ENCOUNTER — Other Ambulatory Visit: Payer: Self-pay | Admitting: Physician Assistant

## 2023-04-13 DIAGNOSIS — Z01818 Encounter for other preprocedural examination: Secondary | ICD-10-CM

## 2023-04-14 ENCOUNTER — Encounter (HOSPITAL_COMMUNITY): Payer: Self-pay

## 2023-04-14 ENCOUNTER — Ambulatory Visit (HOSPITAL_COMMUNITY)
Admission: RE | Admit: 2023-04-14 | Discharge: 2023-04-14 | Disposition: A | Discharge status: Home / Self Care | Payer: 59 | Source: Ambulatory Visit | Attending: Otolaryngology | Admitting: Otolaryngology

## 2023-04-14 ENCOUNTER — Other Ambulatory Visit: Payer: Self-pay | Admitting: Student

## 2023-04-14 DIAGNOSIS — Z01818 Encounter for other preprocedural examination: Secondary | ICD-10-CM

## 2023-04-14 DIAGNOSIS — R590 Localized enlarged lymph nodes: Secondary | ICD-10-CM | POA: Diagnosis not present

## 2023-04-14 MED ORDER — SODIUM CHLORIDE 0.9 % IV SOLN: INTRAVENOUS | Status: DC

## 2023-04-14 MED ORDER — LIDOCAINE HCL 1 % IJ SOLN
INTRAMUSCULAR | Status: AC
  Filled 2023-04-14: qty 20

## 2023-04-14 NOTE — Discharge Instructions (Signed)
Please call Interventional Radiology clinic [phone] with any questions or concerns.   You may remove your dressing and shower tomorrow.    Needle Biopsy, Care After These instructions tell you how to care for yourself after your procedure. Your doctor may also give you more specific instructions. Call your doctor if youhave any problems or questions. What can I expect after the procedure? After the procedure, it is common to have: Soreness. Bruising. Mild pain. Follow these instructions at home:  Return to your normal activities as told by your doctor. Ask your doctor what activities are safe for you. Take over-the-counter and prescription medicines only as told by your doctor. Wash your hands with soap and water before you change your bandage (dressing). If you cannot use soap and water, use hand sanitizer. Follow instructions from your doctor about: How to take care of your puncture site. When and how to change your bandage. When to remove your bandage. Check your puncture site every day for signs of infection. Watch for: Redness, swelling, or pain. Fluid or blood. Pus or a bad smell. Warmth. Do not take baths, swim, or use a hot tub until your doctor approves. Ask your doctor if you may take showers. You may only be allowed to take sponge baths. Keep all follow-up visits as told by your doctor. This is important. Contact a doctor if you have: A fever. Redness, swelling, or pain at the puncture site, and it lasts longer than a few days. Fluid, blood, or pus coming from the puncture site. Warmth coming from the puncture site. Get help right away if: You have a lot of bleeding from the puncture site. Summary After the procedure, it is common to have soreness, bruising, or mild pain at the puncture site. Check your puncture site every day for signs of infection, such as redness, swelling, or pain. Get help right away if you have severe bleeding from your puncture  site. This information is not intended to replace advice given to you by your health care provider. Make sure you discuss any questions you have with your healthcare provider. Document Revised: 03/21/2020 Document Reviewed: 03/21/2020 Elsevier Patient Education  2022 [employer]    

## 2023-04-14 NOTE — Procedures (Signed)
Interventional Radiology Procedure Note  Procedure: [place] CORE BX RT NECK MILD ADENOPATHY    Complications: None  Estimated Blood Loss:  MIN  Findings: 18 G STRANDY CORES IN SALINE     Sharen Counter, MD

## 2023-04-16 LAB — SURGICAL PATHOLOGY

## 2023-04-30 ENCOUNTER — Other Ambulatory Visit: Payer: Self-pay | Admitting: Radiation Therapy

## 2023-05-03 ENCOUNTER — Other Ambulatory Visit: Payer: Self-pay | Admitting: Radiation Therapy

## 2023-05-03 ENCOUNTER — Encounter: Payer: Self-pay | Admitting: Genetic Counselor

## 2023-05-03 ENCOUNTER — Telehealth: Payer: Self-pay | Admitting: Family Medicine

## 2023-05-03 ENCOUNTER — Inpatient Hospital Stay: Payer: 59 | Attending: Genetic Counselor | Admitting: Genetic Counselor

## 2023-05-03 ENCOUNTER — Other Ambulatory Visit: Payer: Self-pay

## 2023-05-03 ENCOUNTER — Other Ambulatory Visit: Payer: 59

## 2023-05-03 ENCOUNTER — Telehealth: Payer: Self-pay | Admitting: Genetic Counselor

## 2023-05-03 DIAGNOSIS — D3610 Benign neoplasm of peripheral nerves and autonomic nervous system, unspecified: Secondary | ICD-10-CM

## 2023-05-03 DIAGNOSIS — Z808 Family history of malignant neoplasm of other organs or systems: Secondary | ICD-10-CM

## 2023-05-03 DIAGNOSIS — Z803 Family history of malignant neoplasm of breast: Secondary | ICD-10-CM

## 2023-05-03 DIAGNOSIS — Z86011 Personal history of benign neoplasm of the brain: Secondary | ICD-10-CM

## 2023-05-03 NOTE — Progress Notes (Signed)
REFERRING PROVIDER: Bedelia Person, MD [address] [place],  [place] [address]  PRIMARY PROVIDER:  [place], Buren Kos, MD  PRIMARY REASON FOR VISIT:  1. Family history of breast cancer   2. Family history of brain cancer   3. Gangliocytoma      HISTORY OF PRESENT ILLNESS:   Carla Huffman, a 52 y.o. female, was seen for a [place] cancer genetics consultation at the request of Dr. Maisie Fus due to a family history of cancer and personal history of gangliocytoma.  Carla Huffman presents to clinic today to discuss the possibility of a hereditary predisposition to cancer, genetic testing, and to further clarify her future cancer risks, as well as potential cancer risks for family members.   Carla Huffman is a 52 y.o. female with no personal history of cancer. The patient had a goiter that was removed.  CANCER HISTORY:  Oncology History   No history exists.     Past Medical History:  Diagnosis Date   Anxiety    Family history of brain cancer    Family history of breast cancer    Hypertension    Hypothyroidism     Past Surgical History:  Procedure Laterality Date   CESAREAN SECTION     SVT ABLATION N/A 12/26/2021   Procedure: SVT ABLATION;  Surgeon: Lanier Prude, MD;  Location: [facility] INVASIVE CV LAB;  Service: Cardiovascular;  Laterality: N/A;   THYROIDECTOMY      Social History   Socioeconomic History   Marital status: Married    Spouse name: Not on file   Number of children: Not on file   Years of education: Not on file   Highest education level: Not on file  Occupational History   Not on file  Tobacco Use   Smoking status: Former    Current packs/day: 0.00    Types: Cigarettes    Quit date: 10/06/1995    Years since quitting: 27.5   Smokeless tobacco: Never  Vaping Use   Vaping status: Never Used  Substance and Sexual Activity   Alcohol use: Yes    Comment: ocassionaly   Drug use: No   Sexual activity: Yes  Other Topics Concern   Not  on file  Social History Narrative   Not on file   Social Determinants of Health   Financial Resource Strain: Not on file  Food Insecurity: Not on file  Transportation Needs: Not on file  Physical Activity: Not on file  Stress: Not on file  Social Connections: Not on file     FAMILY HISTORY:  We obtained a detailed, 4-generation family history.  Significant diagnoses are listed below: Family History  Problem Relation Age of Onset   Breast cancer Mother 31   Brain cancer Father 8   Breast cancer Paternal Aunt        Father's maternal half sister   Breast cancer Other 56       Half sister--same father   Healthy Half-Sister        Two maternal half sisters     The patient has two sons who are cancer free.  She has two maternal half sisters and a paternal half sister who had breast cancer.  Both parents are deceased.  The patient's mother had breast cancer at 12.  She was an only child.  Her parents are deceased.  The patient's father died of brain cancer.  He had one full brother and 14 maternal half siblings, one sister  had breast cancer and a bother has a granddaughter who had spinocerebellar ataxia.  The paternal grandparents are deceased.  Carla Huffman is unaware of previous family history of genetic testing for hereditary cancer risks. There is no reported Ashkenazi Jewish ancestry. There is no known consanguinity.  GENETIC COUNSELING ASSESSMENT: Carla Huffman is a 52 y.o. female with a family history of cancer and a personal history of a gangliocytoma and a family history of breast cancer which is somewhat suggestive of a hereditary cancer syndrome like Cowden syndrome and predisposition to cancer given the ages of onset and the gangliocytoma. We, therefore, discussed and recommended the following at today's visit.   DISCUSSION: We discussed that, in general, most cancer is not inherited in families, but instead is sporadic or familial. Sporadic cancers occur by chance  and typically happen at older ages (>50 years) as this type of cancer is caused by genetic changes acquired during an individual's lifetime. Some families have more cancers than would be expected by chance; however, the ages or types of cancer are not consistent with a known genetic mutation or known genetic mutations have been ruled out. This type of familial cancer is thought to be due to a combination of multiple genetic, environmental, hormonal, and lifestyle factors. While this combination of factors likely increases the risk of cancer, the exact source of this risk is not currently identifiable or testable.  We discussed that 5 - 10% of breast cancer is hereditary, with most cases associated with BRCA mutations.  There are other genes that can be associated with hereditary breast cancer syndromes.  These include ATM, CHEK2, PALB2 and PTEN.  We discussed that testing is beneficial for several reasons including knowing how to follow individuals after completing their treatment, identifying whether potential treatment options such as PARP inhibitors would be beneficial, and understand if other family members could be at risk for cancer and allow them to undergo genetic testing.   We reviewed the characteristics, features and inheritance patterns of hereditary cancer syndromes. We also discussed genetic testing, including the appropriate family members to test, the process of testing, insurance coverage and turn-around-time for results. We discussed the implications of a negative, positive, carrier and/or variant of uncertain significant result. Carla Huffman  was offered a common hereditary cancer panel (47 genes) and an expanded pan-cancer panel (77 genes). Carla Huffman was informed of the benefits and limitations of each panel, including that expanded pan-cancer panels contain genes that do not have clear management guidelines at this point in time.  We also discussed that as the number of genes included  on a panel increases, the chances of variants of uncertain significance increases. Carla Huffman decided to pursue genetic testing for the CancerNext-Expanded+RNAinsight gene panel.   The CancerNext-Expanded gene panel offered by [facility] and includes sequencing and rearrangement analysis for the following 77 genes: AIP, ALK, APC*, ATM*, AXIN2, BAP1, BARD1, BMPR1A, BRCA1*, BRCA2*, BRIP1*, CDC73, CDH1*, CDK4, CDKN1B, CDKN2A, CHEK2*, CTNNA1, DICER1, FH, FLCN, KIF1B, LZTR1, MAX, MEN1, MET, MLH1*, MSH2*, MSH3, MSH6*, MUTYH*, NF1*, NF2, NTHL1, PALB2*, PHOX2B, PMS2*, POT1, PRKAR1A, PTCH1, PTEN*, RAD51C*, RAD51D*, RB1, RET, SDHA, SDHAF2, SDHB, SDHC, SDHD, SMAD4, SMARCA4, SMARCB1, SMARCE1, STK11, SUFU, TMEM127, TP53*, TSC1, TSC2, and VHL (sequencing and deletion/duplication); EGFR, EGLN1, HOXB13, KIT, MITF, PDGFRA, POLD1, and POLE (sequencing only); EPCAM and GREM1 (deletion/duplication only). DNA and RNA analyses performed for * genes.   Based on Carla Huffman's family history of cancer, she meets medical criteria for genetic testing. Despite that she meets  criteria, she may still have an out of pocket cost. We discussed that if her out of pocket cost for testing is over $100, the laboratory will call and confirm whether she wants to proceed with testing.  If the out of pocket cost of testing is less than $100 she will be billed by the genetic testing laboratory.   We discussed that some people do not want to undergo genetic testing due to fear of genetic discrimination.  The Genetic Information Nondiscrimination Act (GINA) was signed into federal law in 2008. GINA prohibits health insurers and most employers from discriminating against individuals based on genetic information (including the results of genetic tests and family history information). According to GINA, health insurance companies cannot consider genetic information to be a preexisting condition, nor can they use it to make decisions regarding  coverage or rates. GINA also makes it illegal for most employers to use genetic information in making decisions about hiring, firing, promotion, or terms of employment. It is important to note that GINA does not offer protections for life insurance, disability insurance, or long-term care insurance. GINA does not apply to those in the [employer], those who work for companies with less than 15 employees, and new life insurance or long-term disability insurance policies.  Health status due to a cancer diagnosis is not protected under GINA. More information about GINA can be found by visiting [employer].   PLAN: Despite our recommendation, Carla Huffman did not wish to pursue genetic testing at today's visit. We understand this decision and remain available to coordinate genetic testing at any time in the future. We, therefore, recommend Carla Huffman continue to follow the cancer screening guidelines given by her primary healthcare provider.  Based on Carla Huffman family history, we recommended her paternal half sister, who was diagnosed with breast cancer at age 52, have genetic counseling and testing. Carla Huffman will let us know if we can be of any assistance in coordinating genetic counseling and/or testing for this family member.   Lastly, we encouraged Carla Huffman to remain in contact with cancer genetics annually so that we can continuously update the family history and inform her of any changes in cancer genetics and testing that may be of benefit for this family.   Carla Huffman questions were answered to her satisfaction today. Our contact information was provided should additional questions or concerns arise. Thank you for the referral and allowing [place] to share in the care of your patient.   Carla Worst P. Lowell Guitar, MS, [facility], Patent attorney [email or web address] phone: [phone]  The patient was seen for a total of 40 minutes in face-to-face genetic  counseling.  he patient was seen alone.  Drs. Meliton Rattan, and/or [place] were available for questions, if needed..    _______________________________________________________________________ For Office Staff:  Number of people involved in session: 1 Was an Intern/ student involved with case: no

## 2023-05-03 NOTE — Telephone Encounter (Signed)
Called twice; left a message to schedule appointment with Genetics Counselor, left callback number when patient is ready to schedule appointment

## 2023-05-03 NOTE — Telephone Encounter (Signed)
Called twice; left a message regarding appointment to schedule appointment with Genetics Counselor, left callback number for patient for when they were ready to schedule appointment

## 2023-06-16 ENCOUNTER — Ambulatory Visit (HOSPITAL_BASED_OUTPATIENT_CLINIC_OR_DEPARTMENT_OTHER): Payer: 59 | Admitting: Family Medicine

## 2023-06-16 ENCOUNTER — Ambulatory Visit (INDEPENDENT_AMBULATORY_CARE_PROVIDER_SITE_OTHER): Payer: 59

## 2023-06-16 ENCOUNTER — Encounter (HOSPITAL_BASED_OUTPATIENT_CLINIC_OR_DEPARTMENT_OTHER): Payer: Self-pay | Admitting: Family Medicine

## 2023-06-16 VITALS — BP 135/75 | HR 77 | Temp 97.9°F | Ht 71.0 in | Wt 316.2 lb

## 2023-06-16 DIAGNOSIS — M79642 Pain in left hand: Secondary | ICD-10-CM | POA: Insufficient documentation

## 2023-06-16 DIAGNOSIS — E039 Hypothyroidism, unspecified: Secondary | ICD-10-CM

## 2023-06-16 NOTE — Assessment & Plan Note (Signed)
Patient currently taking 300 mcg dose of levothyroxine.  Denies any symptoms currently, no issues with significant weight changes, temperature intolerance, changes in bowel habits. We will proceed with recheck of TSH today.  Plan on adjusting dose of levothyroxine as indicated based on lab result

## 2023-06-16 NOTE — Progress Notes (Signed)
    Procedures performed today:    None.  Independent interpretation of notes and tests performed by another provider:   None.  Brief History, Exam, Impression, and Recommendations:    BP 135/75 (BP Location: Left Arm, Patient Position: Sitting, Cuff Size: Normal)   Pulse 77   Temp 97.9 F (36.6 C)   Ht 5\' 11"  (1.803 m)   Wt (!) 316 lb 3.2 oz (143.4 kg)   SpO2 95%   BMI 44.10 kg/m   Pain of left hand Assessment & Plan: About 1 week ago, patient went to get up from a chair and pushed left hand down onto chair armrest and felt sudden pain through palm.  Pain is worse with trying to grip things, fully extend fingers.  She has been wearing wrist splint which has been helpful, also taking ibuprofen.  No associated numbness or tingling. On exam, patient has tenderness to palpation primarily through proximal aspect of palm, no significant tenderness to palpation over dorsum of hand.  Slightly prominent superficial veins, however this appears equal to right hand, no obvious bruising.  Normal range of motion at wrist.  Decreased grip strength compared to right secondary to pain Given degree of symptoms, feel would be prudent to proceed with x-ray imaging for further evaluation at this time.  Pending results, consider referral to orthopedic hand specialist for further evaluation Can continue with bracing to help with pain control, OTC medications, ice or heat  Orders: -     DG Hand Complete Left; Future  Hypothyroidism, unspecified type Assessment & Plan: Patient currently taking 300 mcg dose of levothyroxine.  Denies any symptoms currently, no issues with significant weight changes, temperature intolerance, changes in bowel habits. We will proceed with recheck of TSH today.  Plan on adjusting dose of levothyroxine as indicated based on lab result  Orders: -     TSH  Return if symptoms worsen or fail to improve. Plan to schedule next office visit based on results of labs and  imaging   ___________________________________________ Harshil Cavallaro [place], MD, ABFM, CAQSM Primary Care and Sports Medicine [facility]

## 2023-06-16 NOTE — Assessment & Plan Note (Signed)
About 1 week ago, patient went to get up from a chair and pushed left hand down onto chair armrest and felt sudden pain through palm.  Pain is worse with trying to grip things, fully extend fingers.  She has been wearing wrist splint which has been helpful, also taking ibuprofen.  No associated numbness or tingling. On exam, patient has tenderness to palpation primarily through proximal aspect of palm, no significant tenderness to palpation over dorsum of hand.  Slightly prominent superficial veins, however this appears equal to right hand, no obvious bruising.  Normal range of motion at wrist.  Decreased grip strength compared to right secondary to pain Given degree of symptoms, feel would be prudent to proceed with x-ray imaging for further evaluation at this time.  Pending results, consider referral to orthopedic hand specialist for further evaluation Can continue with bracing to help with pain control, OTC medications, ice or heat

## 2023-06-17 ENCOUNTER — Telehealth (HOSPITAL_BASED_OUTPATIENT_CLINIC_OR_DEPARTMENT_OTHER): Payer: Self-pay | Admitting: *Deleted

## 2023-06-17 ENCOUNTER — Encounter (HOSPITAL_BASED_OUTPATIENT_CLINIC_OR_DEPARTMENT_OTHER): Payer: Self-pay | Admitting: *Deleted

## 2023-06-17 ENCOUNTER — Other Ambulatory Visit (HOSPITAL_BASED_OUTPATIENT_CLINIC_OR_DEPARTMENT_OTHER): Payer: Self-pay | Admitting: *Deleted

## 2023-06-17 ENCOUNTER — Other Ambulatory Visit (HOSPITAL_BASED_OUTPATIENT_CLINIC_OR_DEPARTMENT_OTHER): Payer: Self-pay | Admitting: Family Medicine

## 2023-06-17 DIAGNOSIS — E039 Hypothyroidism, unspecified: Secondary | ICD-10-CM

## 2023-06-17 LAB — TSH: TSH: 0.168 u[IU]/mL — ABNORMAL LOW (ref 0.450–4.500)

## 2023-06-17 MED ORDER — LEVOTHYROXINE SODIUM 75 MCG PO TABS: 75.0000 ug | ORAL_TABLET | Freq: Every day | ORAL | 1 refills | Status: DC

## 2023-06-17 MED ORDER — LEVOTHYROXINE SODIUM 200 MCG PO TABS: 200.0000 ug | ORAL_TABLET | Freq: Every day | ORAL | 1 refills | Status: DC

## 2023-06-17 NOTE — Telephone Encounter (Signed)
Pt scheduled and notified

## 2023-06-17 NOTE — Telephone Encounter (Signed)
Scheduled pt for recheck of lab

## 2023-06-17 NOTE — Telephone Encounter (Signed)
-----   Message from Hosie Poisson [place] sent at 06/17/2023  8:39 AM EDT ----- Please schedule patient for nurse visit for labs in 6 weeks to check TSH.

## 2023-06-22 ENCOUNTER — Telehealth (HOSPITAL_BASED_OUTPATIENT_CLINIC_OR_DEPARTMENT_OTHER): Payer: Self-pay | Admitting: *Deleted

## 2023-06-22 ENCOUNTER — Telehealth (HOSPITAL_BASED_OUTPATIENT_CLINIC_OR_DEPARTMENT_OTHER): Payer: Self-pay | Admitting: Family Medicine

## 2023-06-22 NOTE — Telephone Encounter (Signed)
Pt advised that we are waiting on final report from imaging

## 2023-06-22 NOTE — Telephone Encounter (Signed)
Spoke with imaging, states they will call [place] radiology to bump her reading up to be read

## 2023-06-22 NOTE — Telephone Encounter (Signed)
PATIENT CALLING TO CHECK ON XRAY RESULTS. XRAY COMPLETED 06/16/23 AND 6 DAYS WITHOUT BEING READ. Please advise patients once results for xray come in.

## 2023-06-22 NOTE — Telephone Encounter (Signed)
Patient is calling in regards to her hand xray and would like to know the results. Please advise

## 2023-06-23 ENCOUNTER — Other Ambulatory Visit (HOSPITAL_BASED_OUTPATIENT_CLINIC_OR_DEPARTMENT_OTHER): Payer: Self-pay | Admitting: Family Medicine

## 2023-06-23 DIAGNOSIS — M79642 Pain in left hand: Secondary | ICD-10-CM

## 2023-07-13 ENCOUNTER — Other Ambulatory Visit (HOSPITAL_BASED_OUTPATIENT_CLINIC_OR_DEPARTMENT_OTHER): Payer: Self-pay

## 2023-07-13 ENCOUNTER — Encounter (HOSPITAL_BASED_OUTPATIENT_CLINIC_OR_DEPARTMENT_OTHER): Payer: Self-pay | Admitting: Family Medicine

## 2023-07-13 ENCOUNTER — Encounter (HOSPITAL_BASED_OUTPATIENT_CLINIC_OR_DEPARTMENT_OTHER): Payer: Self-pay | Admitting: *Deleted

## 2023-07-13 ENCOUNTER — Ambulatory Visit (HOSPITAL_BASED_OUTPATIENT_CLINIC_OR_DEPARTMENT_OTHER): Payer: 59 | Admitting: Family Medicine

## 2023-07-13 VITALS — BP 140/98 | HR 115 | Resp 16 | Ht 70.0 in | Wt 311.0 lb

## 2023-07-13 DIAGNOSIS — U071 COVID-19: Secondary | ICD-10-CM | POA: Diagnosis not present

## 2023-07-13 LAB — POCT INFLUENZA A/B
Influenza A, POC: NEGATIVE
Influenza B, POC: NEGATIVE

## 2023-07-13 LAB — POC COVID19 BINAXNOW: SARS Coronavirus 2 Ag: POSITIVE — AB

## 2023-07-13 MED ORDER — NIRMATRELVIR/RITONAVIR (PAXLOVID)TABLET
3.0000 | ORAL_TABLET | Freq: Two times a day (BID) | ORAL | 0 refills | Status: AC
Start: 2023-07-13 — End: 2023-07-18
  Filled 2023-07-13: qty 30, 5d supply, fill #0

## 2023-07-13 NOTE — Progress Notes (Signed)
    Procedures performed today:    None.  Independent interpretation of notes and tests performed by another provider:   None.  Brief History, Exam, Impression, and Recommendations:    BP (!) 140/98 (BP Location: Right Arm, Patient Position: Sitting, Cuff Size: Normal)   Pulse (!) 115   Resp 16   Ht 5\' 10"  (1.778 m)   Wt (!) 311 lb (141.1 kg)   SpO2 97%   BMI 44.62 kg/m   COVID-19 Assessment & Plan: Three days ago, patient woke up in her usual state of health.  As a day went on, she began to have upper respiratory symptoms including sore throat, cough, sinus congestion.  She did take some OTC medication and went to bed and woke up feeling worse the following day.  She indicates that she remained in bed until Monday.  She has been experiencing shortness of breath, body aches, fatigue.  She has not checked her temperature, however has had hot flashes.  She does report that her husband was sick recently, however his symptoms were more mild and resolved.  She has remained home from work due to symptoms. On exam, patient is ill-appearing, not in acute distress.  Vital signs stable.  Cardiovascular exam with slightly tachycardic rate, normal rhythm.  Lungs clear to auscultation bilaterally. History and exam consistent with acute viral illness.  Completed testing in office for both flu and coronavirus, flu was negative while COVID was positive.  Discussed positive result with patient and recommendations.  We reviewed conservative measures to assist with symptom control.  She has not received any prior COVID vaccines.  Discussed that given her history and lack of any prior immunizations, she would be at high risk of progressing to more severe form of illness and thus initiating COVID-specific treatment would be a reasonable consideration.  She would be interested in proceeding with this.  We will start with Paxlovid, instructed on taking entire course of antiviral medication.  We did review  potential side effects/adverse reactions.  Recommend physical distancing, remaining home from work for short period time, particularly given notable symptoms that she is having now.  Follow-up discussed ER precautions, particularly if any worsening of shortness of breath occurs.  Orders: -     nirmatrelvir/ritonavir; Take 3 tablets by mouth 2 (two) times daily for 5 days. (Take nirmatrelvir 150 mg two tablets twice daily for 5 days and ritonavir 100 mg one tablet twice daily for 5 days) Patient GFR is >60  Dispense: 30 tablet; Refill: 0 -     POC COVID-19 BinaxNow -     POCT Influenza A/B    ___________________________________________ Sarayah Bacchi [place], MD, ABFM, CAQSM Primary Care and Sports Medicine [facility]

## 2023-07-13 NOTE — Assessment & Plan Note (Signed)
Three days ago, patient woke up in her usual state of health.  As a day went on, she began to have upper respiratory symptoms including sore throat, cough, sinus congestion.  She did take some OTC medication and went to bed and woke up feeling worse the following day.  She indicates that she remained in bed until Monday.  She has been experiencing shortness of breath, body aches, fatigue.  She has not checked her temperature, however has had hot flashes.  She does report that her husband was sick recently, however his symptoms were more mild and resolved.  She has remained home from work due to symptoms. On exam, patient is ill-appearing, not in acute distress.  Vital signs stable.  Cardiovascular exam with slightly tachycardic rate, normal rhythm.  Lungs clear to auscultation bilaterally. History and exam consistent with acute viral illness.  Completed testing in office for both flu and coronavirus, flu was negative while COVID was positive.  Discussed positive result with patient and recommendations.  We reviewed conservative measures to assist with symptom control.  She has not received any prior COVID vaccines.  Discussed that given her history and lack of any prior immunizations, she would be at high risk of progressing to more severe form of illness and thus initiating COVID-specific treatment would be a reasonable consideration.  She would be interested in proceeding with this.  We will start with Paxlovid, instructed on taking entire course of antiviral medication.  We did review potential side effects/adverse reactions.  Recommend physical distancing, remaining home from work for short period time, particularly given notable symptoms that she is having now.  Follow-up discussed ER precautions, particularly if any worsening of shortness of breath occurs.

## 2023-07-15 ENCOUNTER — Other Ambulatory Visit: Payer: Self-pay

## 2023-07-15 ENCOUNTER — Emergency Department (HOSPITAL_COMMUNITY): Payer: 59

## 2023-07-15 ENCOUNTER — Emergency Department (HOSPITAL_COMMUNITY)
Admission: EM | Admit: 2023-07-15 | Discharge: 2023-07-15 | Discharge status: Against Medical Advice | Payer: 59 | Attending: Emergency Medicine | Admitting: Emergency Medicine

## 2023-07-15 ENCOUNTER — Telehealth (HOSPITAL_BASED_OUTPATIENT_CLINIC_OR_DEPARTMENT_OTHER): Payer: Self-pay | Admitting: *Deleted

## 2023-07-15 ENCOUNTER — Encounter (HOSPITAL_COMMUNITY): Payer: Self-pay

## 2023-07-15 DIAGNOSIS — Z5321 Procedure and treatment not carried out due to patient leaving prior to being seen by health care provider: Secondary | ICD-10-CM | POA: Diagnosis not present

## 2023-07-15 DIAGNOSIS — U071 COVID-19: Secondary | ICD-10-CM | POA: Diagnosis present

## 2023-07-15 DIAGNOSIS — M546 Pain in thoracic spine: Secondary | ICD-10-CM | POA: Diagnosis not present

## 2023-07-15 NOTE — ED Notes (Signed)
Pt was assigned a room, pt name was called 3x, no answer.

## 2023-07-15 NOTE — ED Triage Notes (Signed)
Pt diagnosed with covid 19, started on paxlovid on Tuesday, Wednesday she started having mid back pain that comes around under the ribcage in the front.  Said the other covid symptoms are getting better but this wll not let her sleep.

## 2023-07-15 NOTE — Telephone Encounter (Signed)
FYI Received a message from the after hours line pt went to [facility] ED having sever upper abdominal and middle back pain.

## 2023-07-16 ENCOUNTER — Ambulatory Visit: Payer: 59 | Admitting: Orthopedic Surgery

## 2023-07-20 NOTE — Telephone Encounter (Signed)
Called and spoke with pt to check to see how she was doing and she said she was doing okay.  Patient said that she was having pain in her abdomen and in her back and said she did speak with a nurse about the pain she was having and they told her that they thought it might be coming from the paxlovid that she was prescribed to help with the covid infection.   Patient said she had taken the medicine for 4 days and said after speaking with the nurse in the ED, she did stop taking the paxlovid and once stopped taking it, the pain went away and now she is doing better.   Routing this to both Dr. [place] and [place] as an Lorain Childes.

## 2023-07-20 NOTE — Telephone Encounter (Signed)
LVM for pt to call the office.

## 2023-07-30 ENCOUNTER — Other Ambulatory Visit (HOSPITAL_BASED_OUTPATIENT_CLINIC_OR_DEPARTMENT_OTHER): Payer: 59

## 2023-08-09 ENCOUNTER — Other Ambulatory Visit (HOSPITAL_BASED_OUTPATIENT_CLINIC_OR_DEPARTMENT_OTHER): Payer: 59

## 2023-08-10 LAB — TSH: TSH: 0.122 u[IU]/mL — ABNORMAL LOW (ref 0.450–4.500)

## 2023-08-11 ENCOUNTER — Ambulatory Visit (INDEPENDENT_AMBULATORY_CARE_PROVIDER_SITE_OTHER): Payer: 59

## 2023-08-11 ENCOUNTER — Encounter: Payer: Self-pay | Admitting: Cardiology

## 2023-08-11 ENCOUNTER — Telehealth: Payer: Self-pay | Admitting: Cardiovascular Disease

## 2023-08-11 ENCOUNTER — Ambulatory Visit: Payer: 59 | Attending: Cardiology | Admitting: Cardiology

## 2023-08-11 VITALS — BP 138/90 | HR 79 | Ht 70.0 in | Wt 321.0 lb

## 2023-08-11 DIAGNOSIS — R002 Palpitations: Secondary | ICD-10-CM

## 2023-08-11 DIAGNOSIS — I471 Supraventricular tachycardia, unspecified: Secondary | ICD-10-CM | POA: Diagnosis not present

## 2023-08-11 DIAGNOSIS — I10 Essential (primary) hypertension: Secondary | ICD-10-CM | POA: Diagnosis not present

## 2023-08-11 DIAGNOSIS — R0789 Other chest pain: Secondary | ICD-10-CM

## 2023-08-11 DIAGNOSIS — I7121 Aneurysm of the ascending aorta, without rupture: Secondary | ICD-10-CM

## 2023-08-11 DIAGNOSIS — R0602 Shortness of breath: Secondary | ICD-10-CM

## 2023-08-11 MED ORDER — METOPROLOL SUCCINATE ER 50 MG PO TB24: 50.0000 mg | ORAL_TABLET | Freq: Every day | ORAL | 3 refills | Status: AC

## 2023-08-11 NOTE — Progress Notes (Signed)
Cardiology Office Note:  .   Date:  08/11/2023  ID:  Carla Huffman, DOB 02/28/1971, MRN [id number] PCP: de [place], Raymond J, MD  [facility] Providers Cardiologist:  Chilton Si, MD Electrophysiologist:  Lanier Prude, MD  History of Present Illness: .   Carla Huffman is a 52 y.o. female with past medical history of SVT s/p ablation, hypertension, GAD, hypothyroidism.  She is followed by Dr. Duke Salvia and presents today for a work in visit for evaluation of palpitations.  Per chart review, patient previously admitted to the hospital in 08/2021 with SVT.  Underwent echocardiogram in 08/2021 that showed EF 50-55% with regional wall motion abnormalities, mildly reduced RV function, mild dilation of the ascending aorta measuring 41 mm.  Underwent coronary CTA on 08/29/2021 that showed a coronary calcium score of 0 with no evidence of CAD.  She wore a cardiac monitor in 09/2021 that showed 12 runs of SVT lasting up to 11 seconds, rare PVCs and PACs, up to 7 beats of NSVT.  She was referred to Dr. Lalla Brothers.  Underwent SVT ablation on 12/26/2021.  Patient was last seen by Dr. Lalla Brothers on 01/23/2022.  At that time, patient was doing well and had no recurrence of her arrhythmia.  Patient called the office on 11/6 complaining of palpitations, shortness of breath.  Asked to be seen in the office.  Today, patient presents with recent episodes of palpitations, shortness of breath, and chest tightness. She described two separate episodes of palpitations, the first of which was characterized by a sensation of palpitations and chest tightness that resolved spontaneously. She only felt symptoms for a few seconds. The second episode was more severe, with her experiencing a racing heart, dizziness, and difficulty breathing. This episode lasted less than a minute, but the patient reported persistent difficulty in breathing, described as an inability to get enough air, since the episode.  The patient  also reported a residual cough from a recent mild COVID-19 infection a month prior. The patient noted that the chest tightness associated with the palpitations lasted approximately 30 minutes. The patient also reported a sensation of nervousness and mild chest discomfort, unrelated to palpitations.  The patient reported difficulty in lying flat due to breathlessness and a recent weight gain of approximately 26 pounds since April. The patient did not report any significant dietary changes or excessive caffeine or alcohol consumption. The patient also reported shortness of breath on exertion prior to these episodes, but no chest pain. She denies chest pain that was not associated with the episodes of palpitations.   The patient has a history of thyroid issues and is currently on metoprolol. The patient's recent TSH level was reported as 0.122. The patient denied any allergies to adhesives.  ROS: Patient reports 2 episodes of palpitations with associated shortness of breath, dizziness, chest tightness. Denies syncope, near syncope. Denies ankle edema   Studies Reviewed: .   Cardiac Studies & Procedures       ECHOCARDIOGRAM  ECHOCARDIOGRAM COMPLETE 08/29/2021  Narrative ECHOCARDIOGRAM REPORT    Patient Name:   Carla Huffman Date of Exam: 08/29/2021 Medical Rec #:  [id number]        Height:       71.0 in Accession #:    [id number]       Weight:       304.9 lb Date of Birth:  [birth year]         BSA:          2.522 m Patient Age:  50 years         BP:           122/85 mmHg Patient Gender: F                HR:           71 bpm. Exam Location:  Inpatient  Procedure: 2D Echo  Indications:    elevated troponin  History:        Patient has no prior history of Echocardiogram examinations. Arrythmias:SVT.  Sonographer:    Delcie Roch RDCS Referring Phys: [id number] FAN YE  IMPRESSIONS   1. Inferior basal hypokinesis . Left ventricular ejection fraction, by estimation, is 50 to 55%.  The left ventricle has low normal function. The left ventricle demonstrates regional wall motion abnormalities (see scoring diagram/findings for description). Left ventricular diastolic parameters were normal. 2. Right ventricular systolic function is mildly reduced. The right ventricular size is mildly enlarged. There is normal pulmonary artery systolic pressure. 3. Left atrial size was mildly dilated. 4. Right atrial size was mildly dilated. 5. The mitral valve is normal in structure. No evidence of mitral valve regurgitation. No evidence of mitral stenosis. 6. The aortic valve is normal in structure. Aortic valve regurgitation is not visualized. No aortic stenosis is present. 7. Aortic dilatation noted. There is mild dilatation of the ascending aorta, measuring 41 mm. 8. The inferior vena cava is normal in size with greater than 50% respiratory variability, suggesting right atrial pressure of 3 mmHg.  FINDINGS Left Ventricle: Inferior basal hypokinesis. Left ventricular ejection fraction, by estimation, is 50 to 55%. The left ventricle has low normal function. The left ventricle demonstrates regional wall motion abnormalities. The left ventricular internal cavity size was normal in size. There is no left ventricular hypertrophy. Left ventricular diastolic parameters were normal.  Right Ventricle: The right ventricular size is mildly enlarged. Right vetricular wall thickness was not assessed. Right ventricular systolic function is mildly reduced. There is normal pulmonary artery systolic pressure. The tricuspid regurgitant velocity is 2.28 m/s, and with an assumed right atrial pressure of 3 mmHg, the estimated right ventricular systolic pressure is 23.8 mmHg.  Left Atrium: Left atrial size was mildly dilated.  Right Atrium: Right atrial size was mildly dilated.  Pericardium: There is no evidence of pericardial effusion.  Mitral Valve: The mitral valve is normal in structure. No evidence of  mitral valve regurgitation. No evidence of mitral valve stenosis.  Tricuspid Valve: The tricuspid valve is normal in structure. Tricuspid valve regurgitation is mild . No evidence of tricuspid stenosis.  Aortic Valve: The aortic valve is normal in structure. Aortic valve regurgitation is not visualized. No aortic stenosis is present.  Pulmonic Valve: The pulmonic valve was normal in structure. Pulmonic valve regurgitation is not visualized. No evidence of pulmonic stenosis.  Aorta: The aortic root is normal in size and structure and aortic dilatation noted. There is mild dilatation of the ascending aorta, measuring 41 mm.  Venous: The inferior vena cava is normal in size with greater than 50% respiratory variability, suggesting right atrial pressure of 3 mmHg.  IAS/Shunts: No atrial level shunt detected by color flow Doppler.   LEFT VENTRICLE PLAX 2D LVIDd:         5.10 cm      Diastology LVIDs:         4.00 cm      LV e' medial:    5.22 cm/s LV PW:         1.20 cm  LV E/e' medial:  11.1 LV IVS:        1.10 cm      LV e' lateral:   6.42 cm/s LVOT diam:     2.20 cm      LV E/e' lateral: 9.0 LV SV:         66 LV SV Index:   26 LVOT Area:     3.80 cm  3D Volume EF: LV Volumes (MOD)            3D EF:        48 % LV vol d, MOD A2C: 108.0 ml LV EDV:       149 ml LV vol s, MOD A2C: 59.3 ml  LV ESV:       78 ml LV SV MOD A2C:     48.7 ml  LV SV:        71 ml  RIGHT VENTRICLE             IVC RV S prime:     10.00 cm/s  IVC diam: 2.10 cm TAPSE (M-mode): 1.6 cm  LEFT ATRIUM             Index        RIGHT ATRIUM           Index LA diam:        4.00 cm 1.59 cm/m   RA Area:     14.60 cm LA Vol (A2C):   68.5 ml 27.16 ml/m  RA Volume:   33.70 ml  13.36 ml/m LA Vol (A4C):   44.1 ml 17.48 ml/m LA Biplane Vol: 58.2 ml 23.07 ml/m AORTIC VALVE LVOT Vmax:   86.50 cm/s LVOT Vmean:  56.600 cm/s LVOT VTI:    0.174 m  AORTA Ao Root diam: 3.80 cm Ao Asc diam:  4.10 cm  MITRAL VALVE                TRICUSPID VALVE MV Area (PHT): 3.60 cm    TR Peak grad:   20.8 mmHg MV Decel Time: 211 msec    TR Vmax:        228.00 cm/s MV E velocity: 57.80 cm/s MV A velocity: 56.60 cm/s  SHUNTS MV E/A ratio:  1.02        Systemic VTI:  0.17 m Systemic Diam: 2.20 cm  Charlton Haws MD Electronically signed by Charlton Haws MD Signature Date/Time: 08/29/2021/11:22:56 AM    Final    MONITORS  LONG TERM MONITOR-LIVE TELEMETRY (3-14 DAYS) 09/22/2021  Narrative 14 day Zio Monitor  Quality: Fair.  Baseline artifact. Predominant rhythm: sinus rhythm Average heart rate: 75 bpm sinus rhythm.  179 bpm in SVT. Max heart rate: 117 bpm Min heart rate: 46 bpm Pauses >2.5 seconds: none  Up to 7 beats NSVT 12 runs of SVT lasting up to 11 seconds.  Average heart rate 124 bpm. Rare (less than 1%) PVCs and PACs  Tiffany C. Duke Salvia, MD, [facility] 10/04/2021 1:20 PM   CT SCANS  CT CORONARY MORPH W/CTA COR W/SCORE 08/29/2021  Addendum 08/29/2021  2:25 PM ADDENDUM REPORT: 08/29/2021 14:23  CLINICAL DATA:  This is a 52 year old female with elevated troponin.  EXAM: Cardiac/Coronary  CTA  TECHNIQUE: The patient was scanned on a [employer].  FINDINGS: A 100 kV prospective scan was triggered in the descending thoracic aorta at 111 HU's. Axial non-contrast 3 mm slices were carried out through the heart. The data set was analyzed on a  dedicated work station and scored using the [employer] . Gantry rotation speed was 250 msecs and collimation was .6 mm. No beta blockade and 0.8 mg of sl NTG was given. The 3D data set was reconstructed in 5% intervals of the 67-82 % of the R-R cycle. Diastolic phases were analyzed on a dedicated work station using MPR, MIP and VRT modes. The patient received 80 cc of contrast.  Image quality: Contrast opacification not optimal but the is good visibility of the coronary arteries. Evidence of respiratory motion artifact.  Aorta: The  proximal ascending aorta is dilated (41.4 mm). No calcifications. No dissection.  Aortic Valve:  Trileaflet.  No calcifications.  Coronary Arteries:  Normal coronary origin.  Right dominance.  RCA is a large dominant artery that gives rise to PDA and PLA. There is no plaque.  Left main is a large artery that gives rise to LAD and LCX arteries.  LAD is a large vessel that has no plaque.  LCX is a non-dominant artery that gives rise to one large OM1 branch. There is no plaque.  Coronary Calcium Score:  Left main: 0  Left anterior descending artery: 0  Left circumflex artery: 0  Right coronary artery: 0  Total: 0  Percentile: 0  Other findings:  Normal pulmonary vein drainage into the left atrium.  Normal left atrial appendage without a thrombus.  Normal size of the pulmonary artery.  IMPRESSION: 1. Coronary calcium score of 0. This was 0 percentile for age and sex matched control.  2. Normal coronary origin with right dominance.  3. No evidence of CAD. CAD-RADS 0. No evidence of CAD (0%). Consider non-atherosclerotic causes of chest pain.  4. Proximal ascending aorta dilatation.   Electronically Signed By: Thomasene Ripple D.O. On: 08/29/2021 14:23  Narrative EXAM: OVER-READ INTERPRETATION  CT CHEST  The following report is an over-read performed by radiologist Dr. Marliss Coots of [facility] on 08/29/2021. This over-read does not include interpretation of cardiac or coronary anatomy or pathology. The coronary CTA interpretation by the cardiologist is attached.  COMPARISON:  None.  FINDINGS: Vascular: Fusiform ectasia of the ascending aorta measuring up to 42 mm. No additional significant extra cardiac vascular abnormality.  Mediastinum/Nodes: No enlarged mediastinal or axillary lymph nodes. The trachea and esophagus demonstrate no significant findings.  Lungs/Pleura: No focal consolidation, pleural effusion, or pneumothorax.  Upper  Abdomen: No acute abnormality.  Musculoskeletal: No chest wall mass or suspicious bone lesions identified.  IMPRESSION: Fusiform aneurysmal dilation of the visualized ascending thoracic aorta measuring up to 4.2 cm. Recommend annual imaging followup by CTA or MRA. This recommendation follows 2010 ACCF/AHA/AATS/ACR/ASA/SCA/SCAI/SIR/STS/SVM Guidelines for the Diagnosis and Management of Patients with Thoracic Aortic Disease. Circulation. 2010; 121: W295-A213. Aortic aneurysm NOS (ICD10-I71.9)  Marliss Coots, MD  [facility]  [facility]  Electronically Signed: By: Marliss Coots M.D. On: 08/29/2021 14:11          Risk Assessment/Calculations:     HYPERTENSION CONTROL Vitals:   08/11/23 1035 08/11/23 1218  BP: (!) 136/90 (!) 138/90    The patient's blood pressure is elevated above target today.  In order to address the patient's elevated BP: A current anti-hypertensive medication was adjusted today.          Physical Exam:   VS:  BP (!) 138/90   Pulse 79   Ht 5\' 10"  (1.778 m)   Wt (!) 321 lb (145.6 kg)   SpO2 95%   BMI 46.06 kg/m  Wt Readings from Last 3 Encounters:  08/11/23 (!) 321 lb (145.6 kg)  07/15/23 (!) 311 lb (141.1 kg)  07/13/23 (!) 311 lb (141.1 kg)    GEN: Well nourished, well developed in no acute distress. Sitting comfortably on the exam table  NECK: No JVD; No carotid bruits CARDIAC: RRR, no murmurs, rubs, gallops. Radial pulses 2+ bilaterally  RESPIRATORY:  Clear to auscultation without rales, wheezing or rhonchi. Normal work of breathing on room air  ABDOMEN: Soft, non-tender, non-distended EXTREMITIES:  No edema in BLE; No deformity   ASSESSMENT AND PLAN: .    History of SVT, s/p ablation Palpitations -Patient previously went SVT ablation with Dr. Lalla Brothers in 12/2021.  Reports that prior to her ablation, her episodes of SVT were very intense and often caused her chest pain, shortness of breath,  full body soreness. -Today, patient presents complaining of 2 separate episodes of palpitations.  Both episodes were brief, lasting under 1 minute.  Palpitations were associated with shortness of breath, dizziness, and chest tightness. Episodes were not as intense as her SVT in the past -EKG today showed normal sinus rhythm -Patient has known history of hypothyroidism.  Reports that her TSH was checked yesterday and was low at 0.122.  Ordered free T4 today -Ordered BMP, mag, CBC -Ordered 14-day ZIO -Increase metoprolol succinate to 50 mg daily - Discussed triggers for palpitations including caffeine, alcohol use, dehydration   Chest Tightness  -Patient reports having chest tightness during the episodes of palpitations.  Denies having chest pain/tightness aside from these episodes. No chest pain on exertion  -EKG today with normal sinus rhythm, no ischemic changes -Patient previously underwent CT coronary in 08/2021 that showed a coronary calcium score of 0.  This is very reassuring -Overall, patient's chest tightness is atypical.  Suspect it is associated with palpitations.  Very low suspicion for CAD. Ordered zio monitor and echocardiogram   Shortness of breath Dilation of Ascending Aorta  -Patient reports that she has been having shortness of breath on exertion even prior to her episodes of palpitations.  Shortness of breath seem to worsen with palpitations -Patient's echocardiogram in 08/2021 showed EF 50-55% with regional wall motion abnormalities, mildly reduced RV function, normal PA systolic pressure, mild dilatation of the ascending aorta  -Patient euvolemic on exam today  - Ordered echocardiogram   Hypertension  -BP elevated today in clinic  - Increased metoprolol succinate to 50 mg daily  - Discussed low sodium diets   Dispo: Follow up in 6 weeks for monitor results   Signed, Jonita Albee, PA-C

## 2023-08-11 NOTE — Progress Notes (Unsigned)
Enrolled patient for a 14 day Zio XT monitor to be mailed to patients home  [place] to read

## 2023-08-11 NOTE — Telephone Encounter (Signed)
Patient c/o Palpitations:  High priority if patient c/o lightheadedness, shortness of breath, or chest pain  How long have you had palpitations/irregular HR/ Afib? Are you having the symptoms now? Two this week, and no   Are you currently experiencing lightheadedness, SOB or CP? Shortness of breath   Do you have a history of afib (atrial fibrillation) or irregular heart rhythm? Yes   Have you checked your BP or HR? (document readings if available): HR - 76   Are you experiencing any other symptoms? Slight chest pain yesterday

## 2023-08-11 NOTE — Patient Instructions (Addendum)
Medication Instructions:  Increase Metoprolol Succinate to 50 mg at bedtime. *If you need a refill on your cardiac medications before your next appointment, please call your pharmacy*   Lab Work: Today we will draw CBC, BMP, Mag, and T4 Free. If you have labs (blood work) drawn today and your tests are completely normal, you will receive your results only by: MyChart Message (if you have MyChart) OR A paper copy in the mail If you have any lab test that is abnormal or we need to change your treatment, we will call you to review the results.   Testing/Procedures: Your physician has requested that you have an echocardiogram. Echocardiography is a painless test that uses sound waves to create images of your heart. It provides your doctor with information about the size and shape of your heart and how well your heart's chambers and valves are working. This procedure takes approximately one hour. There are no restrictions for this procedure. Please do NOT wear cologne, perfume, aftershave, or lotions (deodorant is allowed). Please arrive 15 minutes prior to your appointment time.  Please note: We ask at that you not bring children with you during ultrasound (echo/ vascular) testing. Due to room size and safety concerns, children are not allowed in the ultrasound rooms during exams. Our front office staff cannot provide observation of children in our lobby area while testing is being conducted. An adult accompanying a patient to their appointment will only be allowed in the ultrasound room at the discretion of the ultrasound technician under special circumstances. We apologize for any inconvenience.  ZIO XT- Long Term Monitor Instructions  Your physician has requested you wear a ZIO patch monitor for 14 days.  This is a single patch monitor. [employer] supplies one patch monitor per enrollment. Additional stickers are not available. Please do not apply patch if you will be having a Nuclear Stress  Test,  Echocardiogram, Cardiac CT, MRI, or Chest Xray during the period you would be wearing the  monitor. The patch cannot be worn during these tests. You cannot remove and re-apply the  ZIO XT patch monitor.  Your ZIO patch monitor will be mailed 3 day USPS to your address on file. It may take 3-5 days  to receive your monitor after you have been enrolled.  Once you have received your monitor, please review the enclosed instructions. Your monitor  has already been registered assigning a specific monitor serial # to you.  Billing and Patient Assistance Program Information  We have supplied [employer] with any of your insurance information on file for billing purposes. [employer] offers a sliding scale Patient Assistance Program for patients that do not have  insurance, or whose insurance does not completely cover the cost of the ZIO monitor.  You must apply for the Patient Assistance Program to qualify for this discounted rate.  To apply, please call [employer] at [phone], select option 4, select option 2, ask to apply for  Patient Assistance Program. Meredeth Ide will ask your household income, and how many people  are in your household. They will quote your out-of-pocket cost based on that information.  [employer] will also be able to set up a 101-month, interest-free payment plan if needed.  Applying the monitor   Shave hair from upper left chest.  Hold abrader disc by orange tab. Rub abrader in 40 strokes over the upper left chest as  indicated in your monitor instructions.  Clean area with 4 enclosed alcohol pads. Let dry.  Apply patch as indicated  in monitor instructions. Patch will be placed under collarbone on left  side of chest with arrow pointing upward.  Rub patch adhesive wings for 2 minutes. Remove white label marked "1". Remove the white  label marked "2". Rub patch adhesive wings for 2 additional minutes.  While looking in a mirror, press and release button in center of patch. A  small green light will  flash 3-4 times. This will be your only indicator that the monitor has been turned on.  Do not shower for the first 24 hours. You may shower after the first 24 hours.  Press the button if you feel a symptom. You will hear a small click. Record Date, Time and  Symptom in the Patient Logbook.  When you are ready to remove the patch, follow instructions on the last 2 pages of Patient  Logbook. Stick patch monitor onto the last page of Patient Logbook.  Place Patient Logbook in the blue and white box. Use locking tab on box and tape box closed  securely. The blue and white box has prepaid postage on it. Please place it in the mailbox as  soon as possible. Your physician should have your test results approximately 7 days after the  monitor has been mailed back to [facility].  Call [facility] Customer Care at [phone] if you have questions regarding  your ZIO XT patch monitor. Call them immediately if you see an orange light blinking on your  monitor.  If your monitor falls off in less than 4 days, contact our Monitor department at [phone].  If your monitor becomes loose or falls off after 4 days call [employer] at [phone] for  suggestions on securing your monitor    Follow-Up: At [facility], you and your health needs are our priority.  As part of our continuing mission to provide you with exceptional heart care, we have created designated Provider Care Teams.  These Care Teams include your primary Cardiologist (physician) and Advanced Practice Providers (APPs -  Physician Assistants and Nurse Practitioners) who all work together to provide you with the care you need, when you need it.  We recommend signing up for the patient portal called "MyChart".  Sign up information is provided on this After Visit Summary.  MyChart is used to connect with patients for Virtual Visits (Telemedicine).  Patients are able to view lab/test results, encounter  notes, upcoming appointments, etc.  Non-urgent messages can be sent to your provider as well.   To learn more about what you can do with MyChart, go to [email or web address].    Your next appointment:   5-6 week(s)  Provider:   Any APP

## 2023-08-11 NOTE — Telephone Encounter (Signed)
Spoke with patient regarding intermittent palpitations that started having Monday  Has had couple of episodes of chest tightness and shortness of breath, now just doesn't feel right/well  Scheduled patient an appointment today with Loleta Books PA at [facility] Patient aware of date, time and location.

## 2023-08-12 LAB — BASIC METABOLIC PANEL
BUN/Creatinine Ratio: 23 (ref 9–23)
BUN: 18 mg/dL (ref 6–24)
CO2: 28 mmol/L (ref 20–29)
Calcium: 8.5 mg/dL — ABNORMAL LOW (ref 8.7–10.2)
Chloride: 104 mmol/L (ref 96–106)
Creatinine, Ser: 0.79 mg/dL (ref 0.57–1.00)
Glucose: 86 mg/dL (ref 70–99)
Potassium: 4.8 mmol/L (ref 3.5–5.2)
Sodium: 143 mmol/L (ref 134–144)
eGFR: 90 mL/min/{1.73_m2} (ref >59)

## 2023-08-12 LAB — CBC
Hematocrit: 42 % (ref 34.0–46.6)
Hemoglobin: 13.2 g/dL (ref 11.1–15.9)
MCH: 28.3 pg (ref 26.6–33.0)
MCHC: 31.4 g/dL — ABNORMAL LOW (ref 31.5–35.7)
MCV: 90 fL (ref 79–97)
Platelets: 272 10*3/uL (ref 150–450)
RBC: 4.66 x10E6/uL (ref 3.77–5.28)
RDW: 14 % (ref 11.7–15.4)
WBC: 10.5 10*3/uL (ref 3.4–10.8)

## 2023-08-12 LAB — MAGNESIUM: Magnesium: 1.9 mg/dL (ref 1.6–2.3)

## 2023-08-12 LAB — T4, FREE: Free T4: 2.17 ng/dL — ABNORMAL HIGH (ref 0.82–1.77)

## 2023-08-13 ENCOUNTER — Telehealth: Payer: Self-pay

## 2023-08-13 DIAGNOSIS — I471 Supraventricular tachycardia, unspecified: Secondary | ICD-10-CM

## 2023-08-13 DIAGNOSIS — R002 Palpitations: Secondary | ICD-10-CM

## 2023-08-13 NOTE — Telephone Encounter (Signed)
Patient identification verified by 2 forms. Carla Rail, RN   Called and spoke to patient  Patient states she spoke to [facility]  Has no questions at this time

## 2023-08-13 NOTE — Telephone Encounter (Signed)
Patient called advised of below they verbalized understanding, No questions.

## 2023-08-13 NOTE — Telephone Encounter (Signed)
Patient is returning call. Transferred to [place], [place].

## 2023-08-13 NOTE — Telephone Encounter (Signed)
Left message to call back  

## 2023-08-13 NOTE — Telephone Encounter (Signed)
-----   Message from Jonita Albee sent at 08/12/2023 11:49 AM EST ----- Please tell patient that her free T4 is elevated- this means that she might be on too high of a dose of levothyroxine. She should follow up with her PCP for adjustments. Note, this could contribute to her recent palpitations.   Otherwise, lab work showed normal kidney function, normal potassium and magnesium. Calcium is a little low, which seems to be a chronic finding for her. WBC, RBC, hemoglobin normal.

## 2023-08-16 ENCOUNTER — Ambulatory Visit: Payer: 59 | Admitting: Orthopedic Surgery

## 2023-09-14 ENCOUNTER — Other Ambulatory Visit (HOSPITAL_COMMUNITY): Payer: Self-pay | Admitting: Neurosurgery

## 2023-09-14 DIAGNOSIS — Q048 Other specified congenital malformations of brain: Secondary | ICD-10-CM

## 2023-09-16 NOTE — Progress Notes (Signed)
Cardiology Office Note:  .   Date:  09/21/2023  ID:  Carla Huffman, DOB [birth year], MRN [id number] PCP: de Peru, Raymond J, MD  [facility] Providers Cardiologist:  Chilton Si, MD }   History of Present Illness: .   Carla Huffman is a very pleasant 52 y.o. female  with past medical history of SVT s/p ablation, hypertension, GAD, hypothyroidism.  Last seen in the office by Robet Leu, PA-C, she was complaining of palpitations and shortness of breath.  She also complained of chest tightness associated with palpitations.  A 14-day ZIO monitor was ordered, she was advised on reducing caffeine alcohol and dehydration.  A T4 was ordered along with a BMET.  EKG was normal and chest pain was atypical.  Metoprolol was increased to 50 mg daily.  Labs revealed elevated free T4 of 2.17.  BMET were normal without evidence of hypokalemia.  Calcium was low at 8.5, magnesium was normal and she was not found to be anemic.  ZIO monitor revealed an average heart rate of 83 bpm, there were 2 ventricular tachycardia runs with a maximal heart rate of 139 bpm.  She had 20 supraventricular tachycardia runs, loss goes lasting 16 bpm.  There were no evidence of atrial fibrillation.   Since that time the patient has increased her levothyroxine from 75 mcg daily to 200 mcg daily on her own.  She is awaiting her primary care physician to call her back concerning her labs.  Since increasing her dose of levothyroxine her palpitations have subsided almost completely.  She states she hardly notices them at all.  She denies chest pain, shortness of breath, or dizziness.  She states she is feeling well and her next goal is to lose weight.  ROS: As above otherwise negative  Studies Reviewed: .      Echocardiogram 09/17/2023 1. Left ventricular ejection fraction, by estimation, is 60 to 65%. The  left ventricle has normal function. The left ventricle has no regional  wall motion abnormalities. There is  mild concentric left ventricular  hypertrophy. Left ventricular diastolic  parameters are consistent with Grade I diastolic dysfunction (impaired  relaxation). The average left ventricular global longitudinal strain is  -18.3 %. The global longitudinal strain is normal.   2. Right ventricular systolic function is normal. The right ventricular  size is normal. Tricuspid regurgitation signal is inadequate for assessing  PA pressure.   3. The mitral valve is normal in structure. No evidence of mitral valve  regurgitation. No evidence of mitral stenosis.   4. The aortic valve is tricuspid. Aortic valve regurgitation is not  visualized. No aortic stenosis is present.   5. Aortic dilatation noted. There is mild dilatation of the aortic root,  measuring 42 mm. There is mild dilatation of the ascending aorta,  measuring 41 mm.   6. The inferior vena cava is normal in size with greater than 50%  respiratory variability, suggesting right atrial pressure of 3 mmHg.     Physical Exam:   VS:  BP 132/86   Pulse 82   Ht 5\' 10"  (1.778 m)   Wt (!) 323 lb 9.6 oz (146.8 kg)   SpO2 97%   BMI 46.43 kg/m    Wt Readings from Last 3 Encounters:  09/21/23 (!) 323 lb 9.6 oz (146.8 kg)  08/11/23 (!) 321 lb (145.6 kg)  07/15/23 (!) 311 lb (141.1 kg)    GEN: Well nourished, well developed in no acute distress NECK: No JVD; No carotid bruits  CARDIAC: RRR, no murmurs, rubs, gallops RESPIRATORY:  Clear to auscultation without rales, wheezing or rhonchi  ABDOMEN: Soft, non-tender, non-distended EXTREMITIES:  No edema; No deformity   ASSESSMENT AND PLAN: .    PSVT: Seen on ZIO monitor but has completely resolved symptomatically with increased dose of levothyroxine from 75 mcg daily to 200 mcg daily which the patient has done on her own.  I have reviewed her ZIO monitor report with her.  The patient verbalizes understanding but is no longer having any issues at this time.  Continue current medication regimen  with increased dose of metoprolol to 50 mg daily and she is to follow-up with PCP.  I have also discussed echocardiogram results.  She has normal LV function with grade 1 diastolic dysfunction.  Continue low-sodium diet and blood pressure control.  2.  Hypothyroidism: Followed by PCP now on higher dose of levothyroxine 200 mcg daily.  She is to call her PCP to make an appointment and to discuss this further.  With increased dose of the levothyroxine, the patient's symptoms of palpitations have subsided.  3.  Morbid obesity: The patient is adamant about losing weight.  I have suggested that she talk to her primary care physician about [place] healthy weight and wellness.  She will discuss this further when she sees them on follow-up.         Signed, Bettey Mare. Liborio Nixon, ANP, AACC

## 2023-09-17 ENCOUNTER — Ambulatory Visit (HOSPITAL_COMMUNITY): Payer: 59 | Attending: Cardiology

## 2023-09-17 DIAGNOSIS — I471 Supraventricular tachycardia, unspecified: Secondary | ICD-10-CM | POA: Diagnosis present

## 2023-09-17 DIAGNOSIS — R002 Palpitations: Secondary | ICD-10-CM | POA: Diagnosis not present

## 2023-09-17 LAB — ECHOCARDIOGRAM COMPLETE
Area-P 1/2: 3.95 cm2
S' Lateral: 3 cm

## 2023-09-21 ENCOUNTER — Encounter: Payer: Self-pay | Admitting: Adult Health

## 2023-09-21 ENCOUNTER — Ambulatory Visit: Payer: 59 | Attending: Adult Health | Admitting: Adult Health

## 2023-09-21 VITALS — BP 132/86 | HR 82 | Ht 70.0 in | Wt 323.6 lb

## 2023-09-21 DIAGNOSIS — I471 Supraventricular tachycardia, unspecified: Secondary | ICD-10-CM | POA: Diagnosis not present

## 2023-09-21 DIAGNOSIS — I10 Essential (primary) hypertension: Secondary | ICD-10-CM

## 2023-09-21 NOTE — Patient Instructions (Signed)
Medication Instructions:  No Changes *If you need a refill on your cardiac medications before your next appointment, please call your pharmacy*   Lab Work: No Labs If you have labs (blood work) drawn today and your tests are completely normal, you will receive your results only by: [place] (if you have MyChart) OR A paper copy in the mail If you have any lab test that is abnormal or we need to change your treatment, we will call you to review the results.   Testing/Procedures: No Testing   Follow-Up: At [facility], you and your health needs are our priority.  As part of our continuing mission to provide you with exceptional heart care, we have created designated Provider Care Teams.  These Care Teams include your primary Cardiologist (physician) and Advanced Practice Providers (APPs -  Physician Assistants and Nurse Practitioners) who all work together to provide you with the care you need, when you need it.  We recommend signing up for the patient portal called "MyChart".  Sign up information is provided on this After Visit Summary.  MyChart is used to connect with patients for Virtual Visits (Telemedicine).  Patients are able to view lab/test results, encounter notes, upcoming appointments, etc.  Non-urgent messages can be sent to your provider as well.   To learn more about what you can do with MyChart, go to [email or web address].    Your next appointment:   1 year(s)  Provider:   Skeet Latch, MD

## 2023-10-20 ENCOUNTER — Other Ambulatory Visit (HOSPITAL_BASED_OUTPATIENT_CLINIC_OR_DEPARTMENT_OTHER): Payer: Self-pay | Admitting: Family Medicine

## 2023-10-20 DIAGNOSIS — E039 Hypothyroidism, unspecified: Secondary | ICD-10-CM

## 2023-10-20 MED ORDER — LEVOTHYROXINE SODIUM 50 MCG PO TABS
50.0000 ug | ORAL_TABLET | Freq: Every day | ORAL | 1 refills | Status: DC
Start: 2023-10-20 — End: 2024-03-16

## 2023-10-21 NOTE — Progress Notes (Signed)
LVM for pt to call the office in regards to labs

## 2023-10-25 ENCOUNTER — Other Ambulatory Visit (HOSPITAL_BASED_OUTPATIENT_CLINIC_OR_DEPARTMENT_OTHER): Payer: Self-pay | Admitting: Family Medicine

## 2023-10-25 DIAGNOSIS — Z1231 Encounter for screening mammogram for malignant neoplasm of breast: Secondary | ICD-10-CM

## 2023-11-02 ENCOUNTER — Other Ambulatory Visit (HOSPITAL_BASED_OUTPATIENT_CLINIC_OR_DEPARTMENT_OTHER): Payer: Self-pay | Admitting: Family Medicine

## 2023-11-02 ENCOUNTER — Other Ambulatory Visit (HOSPITAL_BASED_OUTPATIENT_CLINIC_OR_DEPARTMENT_OTHER): Payer: Self-pay

## 2023-11-02 DIAGNOSIS — Z124 Encounter for screening for malignant neoplasm of cervix: Secondary | ICD-10-CM

## 2023-11-03 ENCOUNTER — Encounter (HOSPITAL_BASED_OUTPATIENT_CLINIC_OR_DEPARTMENT_OTHER): Payer: Self-pay | Admitting: Family Medicine

## 2023-11-03 LAB — TSH: TSH: 0.991 u[IU]/mL (ref 0.450–4.500)

## 2023-11-04 ENCOUNTER — Encounter (HOSPITAL_BASED_OUTPATIENT_CLINIC_OR_DEPARTMENT_OTHER): Payer: Self-pay | Admitting: Certified Nurse Midwife

## 2023-11-10 ENCOUNTER — Other Ambulatory Visit (HOSPITAL_COMMUNITY)
Admission: RE | Admit: 2023-11-10 | Discharge: 2023-11-10 | Disposition: A | Discharge status: Home / Self Care | Payer: 59 | Source: Ambulatory Visit | Attending: Certified Nurse Midwife | Admitting: Certified Nurse Midwife

## 2023-11-10 ENCOUNTER — Encounter (HOSPITAL_BASED_OUTPATIENT_CLINIC_OR_DEPARTMENT_OTHER): Payer: Self-pay | Admitting: Radiology

## 2023-11-10 ENCOUNTER — Ambulatory Visit (HOSPITAL_BASED_OUTPATIENT_CLINIC_OR_DEPARTMENT_OTHER)
Admission: RE | Admit: 2023-11-10 | Discharge: 2023-11-10 | Disposition: A | Discharge status: Home / Self Care | Payer: 59 | Source: Ambulatory Visit | Attending: Family Medicine | Admitting: Family Medicine

## 2023-11-10 ENCOUNTER — Ambulatory Visit (HOSPITAL_BASED_OUTPATIENT_CLINIC_OR_DEPARTMENT_OTHER): Payer: 59 | Admitting: Certified Nurse Midwife

## 2023-11-10 ENCOUNTER — Encounter (HOSPITAL_BASED_OUTPATIENT_CLINIC_OR_DEPARTMENT_OTHER): Payer: Self-pay | Admitting: Certified Nurse Midwife

## 2023-11-10 VITALS — BP 152/97 | HR 79 | Ht 70.0 in | Wt 325.0 lb

## 2023-11-10 DIAGNOSIS — R232 Flushing: Secondary | ICD-10-CM

## 2023-11-10 DIAGNOSIS — Z124 Encounter for screening for malignant neoplasm of cervix: Secondary | ICD-10-CM | POA: Diagnosis present

## 2023-11-10 DIAGNOSIS — Z1231 Encounter for screening mammogram for malignant neoplasm of breast: Secondary | ICD-10-CM | POA: Insufficient documentation

## 2023-11-10 DIAGNOSIS — Z6841 Body Mass Index (BMI) 40.0 and over, adult: Secondary | ICD-10-CM | POA: Diagnosis not present

## 2023-11-10 DIAGNOSIS — Z01419 Encounter for gynecological examination (general) (routine) without abnormal findings: Secondary | ICD-10-CM | POA: Diagnosis not present

## 2023-11-10 DIAGNOSIS — N3001 Acute cystitis with hematuria: Secondary | ICD-10-CM | POA: Diagnosis not present

## 2023-11-10 DIAGNOSIS — B372 Candidiasis of skin and nail: Secondary | ICD-10-CM

## 2023-11-10 DIAGNOSIS — R300 Dysuria: Secondary | ICD-10-CM

## 2023-11-10 LAB — POCT URINALYSIS DIPSTICK
Bilirubin, UA: NEGATIVE
Glucose, UA: NEGATIVE
Ketones, UA: NEGATIVE
Nitrite, UA: NEGATIVE
Protein, UA: POSITIVE — AB
Spec Grav, UA: 1.02 (ref 1.010–1.025)
Urobilinogen, UA: 0.2 U/dL
pH, UA: 6 (ref 5.0–8.0)

## 2023-11-10 MED ORDER — PHENAZOPYRIDINE HCL 100 MG PO TABS: 100.0000 mg | ORAL_TABLET | Freq: Three times a day (TID) | ORAL | 0 refills | Status: DC | PRN

## 2023-11-10 MED ORDER — NYSTATIN POWD: 5 refills | Status: DC

## 2023-11-10 MED ORDER — NITROFURANTOIN MONOHYD MACRO 100 MG PO CAPS
100.0000 mg | ORAL_CAPSULE | Freq: Two times a day (BID) | ORAL | 0 refills | Status: DC
Start: 2023-11-10 — End: 2024-07-03

## 2023-11-10 NOTE — Progress Notes (Signed)
 53 y.o. [id number] Married White or Caucasian female here for annual exam. She has 2 sons (1 vaginal, 1 CS). Patient reports her last menstrual period was approx 18 months ago. She denies vaginal spotting or bleeding since that time. Sexually active w/spouse. Contraception: Vasectomy. Pt reports hot flashes night sweats and other s/s perimenopause including mood changes. She is followed for past medical history of SVT s/p ablation, hypertension, GAD, hypothyroidism. Patient is not a candidate for estrogen.   No LMP recorded. (Menstrual status: Perimenopausal).          Sexually active: Yes.    The current method of family planning is vasectomy.    Exercising: Yes.     Smoker:  no  Health Maintenance: Pap:  Pt unsure when last pap smear was History of abnormal Pap:  Hx LEEP 2002 MMG:  08/10/2017 (mammogram scheduled today) Colonoscopy: Pt planning Colonoscopy or Cologard BMD:   n/a PCP: Dr Penne Cardiology: 09/2023   reports that she quit smoking about 28 years ago. Her smoking use included cigarettes. She has never used smokeless tobacco. She reports current alcohol use. She reports that she does not use drugs.  Past Medical History:  Diagnosis Date   Anxiety    Family history of brain cancer    Family history of breast cancer    Hypertension    Hypothyroidism     Past Surgical History:  Procedure Laterality Date   CESAREAN SECTION     SVT ABLATION N/A 12/26/2021   Procedure: SVT ABLATION;  Surgeon: Cindie Ole DASEN, MD;  Location: [facility] INVASIVE CV LAB;  Service: Cardiovascular;  Laterality: N/A;   THYROIDECTOMY      Current Outpatient Medications  Medication Sig Dispense Refill   calcium  carbonate (OS-CAL) 600 MG TABS tablet Take 600 mg by mouth daily with breakfast.     Flaxseed, Linseed, (FLAXSEED OIL PO) Take 1 capsule by mouth daily at 6 (six) AM.     levothyroxine  (SYNTHROID ) 200 MCG tablet Take 1 tablet (200 mcg total) by mouth daily. 90 tablet 1   levothyroxine   (SYNTHROID ) 50 MCG tablet Take 1 tablet (50 mcg total) by mouth daily. 60 tablet 1   Multiple Vitamin (MULTIVITAMIN WITH MINERALS) TABS tablet Take 1 tablet by mouth daily.     ondansetron  (ZOFRAN -ODT) 8 MG disintegrating tablet Take 1 tablet (8 mg total) by mouth every 8 (eight) hours as needed for nausea. 20 tablet 3   tiZANidine  (ZANAFLEX ) 4 MG tablet Take 1 tablet (4 mg total) by mouth every 8 (eight) hours as needed for muscle spasms. 15 tablet 0   Turmeric 500 MG TABS Take 1 each by mouth daily at 6 (six) AM.     vitamin B-12 (CYANOCOBALAMIN) 1000 MCG tablet Take 1,000 mcg by mouth daily.     metoprolol  succinate (TOPROL -XL) 50 MG 24 hr tablet Take 1 tablet (50 mg total) by mouth daily. Take with or immediately following a meal. 90 tablet 3   No current facility-administered medications for this visit.    Family History  Problem Relation Age of Onset   Breast cancer Mother 29   Brain cancer Father 50   Breast cancer Paternal Aunt        Father's maternal half sister   Breast cancer Other 41       Half sister--same father   Healthy Half-Sister        Two maternal half sisters    ROS: Constitutional:  +hot flashes, occasional night sweats, occasional mood changes Genitourinary: pt  reports urinary frequency and dysuria  Exam:   BP (!) 152/97 (BP Location: Right Arm, Patient Position: Sitting, Cuff Size: Normal)   Pulse 79   Ht 5' 10 (1.778 m)   Wt (!) 325 lb (147.4 kg)   BMI 46.63 kg/m   Height: 5' 10 (177.8 cm)  General appearance: alert, cooperative and appears stated age Head: Normocephalic, without obvious abnormality, atraumatic Breasts: normal appearance, no masses or tenderness, Inspection negative, No nipple retraction or dimpling, No nipple discharge or bleeding, No axillary or supraclavicular adenopathy, Normal to palpation without dominant masses Intertrigo candidal infection present bilaterally  under breasts Abdomen: soft, non-tender; bowel sounds normal; no  masses,  no organomegaly Extremities: extremities normal, atraumatic, no cyanosis or edema Skin: Skin color, texture, turgor normal. No rashes or lesions Lymph nodes: Cervical, supraclavicular, and axillary nodes normal. No abnormal inguinal nodes palpated Neurologic: Grossly normal   Pelvic: External genitalia:  no lesions              Urethra:  normal appearing urethra with no masses, tenderness or lesions              Bartholins and Skenes: normal                 Vagina: normal appearing vagina with normal color and no discharge, no lesions              Cervix: multiparous appearance, no bleeding following Pap, no cervical motion tenderness, and no lesions              Pap taken: Yes.   Bimanual Exam:  Uterus:  normal size, contour, position, consistency, mobility, non-tender              Adnexa: no mass, fullness, tenderness             Anus:  normal sphincter tone, no lesions  Chaperone, Tempie Chancy, CMA, was present for exam.  Assessment/Plan:  1. Encounter for well woman exam with routine gynecological exam - Routine pap smear with routine HPV Co-Testing collected - Annual screening mammogram encouraged-pt has mammogram scheduled today - She is planning colon cancer screening - Vitamin D , Vitamin B12  2. Dysuria (Primary) - Pt feels certain that she has UTI. Desired antibiotic/Pyridium  be sent to pharm today-sent.  - POCT Urinalysis Dipstick - Urine Culture  3. Hot flashes - FSH  4. BMI 45.0-49.9, adult (HCC) - Vitamin D , 25-Hydroxy, Total - B12  5. Acute cystitis with hematuria - nitrofurantoin , macrocrystal-monohydrate, (MACROBID ) 100 MG capsule; Take 1 capsule (100 mg total) by mouth 2 (two) times daily.  Dispense: 14 capsule; Refill: 0  6. Encounter for screening mammogram for malignant neoplasm of breast - MM 3D SCREENING MAMMOGRAM BILATERAL BREAST; Future  7. Cervical cancer screening - Cytology - PAP( [place])  8. Candidal intertrigo - Nystatin   POWD; Apply under breasts twice a day until rash resolves  Dispense: 1 each; Refill: 5   RTO 1 year for annual gyn exam and prn if issues arise. Arland MARLA Roller

## 2023-11-11 ENCOUNTER — Encounter (HOSPITAL_BASED_OUTPATIENT_CLINIC_OR_DEPARTMENT_OTHER): Payer: Self-pay | Admitting: Certified Nurse Midwife

## 2023-11-11 LAB — VITAMIN D 25 HYDROXY (VIT D DEFICIENCY, FRACTURES): Vit D, 25-Hydroxy: 31.2 ng/mL (ref 30.0–100.0)

## 2023-11-11 LAB — FOLLICLE STIMULATING HORMONE: FSH: 24.1 m[IU]/mL

## 2023-11-11 LAB — VITAMIN B12: Vitamin B-12: 483 pg/mL (ref 232–1245)

## 2023-11-12 ENCOUNTER — Telehealth (HOSPITAL_BASED_OUTPATIENT_CLINIC_OR_DEPARTMENT_OTHER): Payer: Self-pay | Admitting: Certified Nurse Midwife

## 2023-11-12 ENCOUNTER — Encounter (HOSPITAL_BASED_OUTPATIENT_CLINIC_OR_DEPARTMENT_OTHER): Payer: Self-pay | Admitting: Certified Nurse Midwife

## 2023-11-12 ENCOUNTER — Telehealth (HOSPITAL_BASED_OUTPATIENT_CLINIC_OR_DEPARTMENT_OTHER): Payer: Self-pay

## 2023-11-12 ENCOUNTER — Ambulatory Visit
Admission: EM | Admit: 2023-11-12 | Discharge: 2023-11-12 | Disposition: A | Discharge status: Home / Self Care | Payer: 59

## 2023-11-12 DIAGNOSIS — R600 Localized edema: Secondary | ICD-10-CM | POA: Diagnosis not present

## 2023-11-12 LAB — URINE CULTURE

## 2023-11-12 NOTE — Telephone Encounter (Signed)
 Patient called today stating when she took the macrobid  that was prescribed to her for UTI her feet began to swell. She started taking the medication on 11/11/2023. She also states when she got up to go to work this morning her feet were swollen and by the time she got to work her feet were swollen over her shoes. Please advise

## 2023-11-12 NOTE — ED Provider Notes (Signed)
 [facility]    CSN: [id number] Arrival date & time: 11/12/23  1146      History   Chief Complaint No chief complaint on file.   HPI Carla Huffman is a 53 y.o. female.   Patient here c/w b/l LE swelling x 2 days.  Report some pain today.  She has history of SVT, s/p cardiac ablation, hypothyroid, HTN.  She denies chest pain, SOB, weight gain, fatigue, abdominal pain.  Recently returned from [place].  Sx improve with elevation / at night, worse after walking around.  She saw cardiologist 6 weeks ago, she had a holter monitor for 2 weeks 3 months ago, and had ECHO 2 months ago.  ECHO with mild LVH, EF 60-65%.  She reports this has happened in the past when her thyroid  is out of whack but she feels that's not the cause today.    Past Medical History:  Diagnosis Date   Anxiety    Family history of brain cancer    Family history of breast cancer    Hypertension    Hypothyroidism     Patient Active Problem List   Diagnosis Date Noted   COVID-19 07/13/2023   Pain of left hand 06/16/2023   Gangliocytoma 05/03/2023   Family history of breast cancer    Family history of brain cancer    Low back pain 12/09/2022   Sinusitis 10/02/2022   Severe obesity (BMI >= 40) (HCC) 10/02/2022   Sore throat 07/27/2022   Dizziness 02/25/2022   Nausea and vomiting 02/05/2022   Hypoparathyroidism (HCC) 12/16/2021   Hypocalcemia 10/20/2021   Hypertension 10/20/2021   Cardiac ischemia 08/28/2021   Supraventricular tachycardia (HCC) 08/28/2021   Hypothyroidism 06/30/2017   Generalized anxiety disorder 06/30/2017    Past Surgical History:  Procedure Laterality Date   CESAREAN SECTION     SVT ABLATION N/A 12/26/2021   Procedure: SVT ABLATION;  Surgeon: Cindie Ole DASEN, MD;  Location: [facility] INVASIVE CV LAB;  Service: Cardiovascular;  Laterality: N/A;   THYROIDECTOMY      OB History     Gravida  3   Para  2   Term  2   Preterm      AB  1   Living  2      SAB      IAB   1   Ectopic      Multiple      Live Births  2            Home Medications    Prior to Admission medications   Medication Sig Start Date End Date Taking? Authorizing Provider  calcium  carbonate (OS-CAL) 600 MG TABS tablet Take 600 mg by mouth daily with breakfast.    [provider]  Flaxseed, Linseed, (FLAXSEED OIL PO) Take 1 capsule by mouth daily at 6 (six) AM.    [provider]  levothyroxine  (SYNTHROID ) 200 MCG tablet Take 1 tablet (200 mcg total) by mouth daily. 06/17/23   de [place], Raymond J, MD  levothyroxine  (SYNTHROID ) 50 MCG tablet Take 1 tablet (50 mcg total) by mouth daily. 10/20/23   de [place], Quintin PARAS, MD  metoprolol  succinate (TOPROL -XL) 50 MG 24 hr tablet Take 1 tablet (50 mg total) by mouth daily. Take with or immediately following a meal. 08/11/23 11/09/23  Vicci Rollo SAUNDERS, PA-C  Multiple Vitamin (MULTIVITAMIN WITH MINERALS) TABS tablet Take 1 tablet by mouth daily.    [provider]  nitrofurantoin , macrocrystal-monohydrate, (MACROBID ) 100 MG capsule Take  1 capsule (100 mg total) by mouth 2 (two) times daily. 11/10/23   Tad Arland POUR, CNM  Nystatin  POWD Apply under breasts twice a day until rash resolves 11/10/23   Lo, Arland POUR, CNM  ondansetron  (ZOFRAN -ODT) 8 MG disintegrating tablet Take 1 tablet (8 mg total) by mouth every 8 (eight) hours as needed for nausea. 02/05/22   de [place], Quintin PARAS, MD  phenazopyridine  (PYRIDIUM ) 100 MG tablet Take 1 tablet (100 mg total) by mouth 3 (three) times daily as needed for pain. 11/10/23   Lo, Arland POUR, CNM  tiZANidine  (ZANAFLEX ) 4 MG tablet Take 1 tablet (4 mg total) by mouth every 8 (eight) hours as needed for muscle spasms. 12/07/22   Banister, Pamela K, MD  Turmeric 500 MG TABS Take 1 each by mouth daily at 6 (six) AM.    [provider]  vitamin B-12 (CYANOCOBALAMIN) 1000 MCG tablet Take 1,000 mcg by mouth daily.    [provider]    Family History Family History  Problem Relation Age of  Onset   Breast cancer Mother 43   Brain cancer Father 53   Breast cancer Paternal Aunt        Father's maternal half sister   Breast cancer Other 71       Half sister--same father   Healthy Half-Sister        Two maternal half sisters    Social History Social History   Tobacco Use   Smoking status: Former    Current packs/day: 0.00    Types: Cigarettes    Quit date: 10/06/1995    Years since quitting: 28.1   Smokeless tobacco: Never  Vaping Use   Vaping status: Never Used  Substance Use Topics   Alcohol use: Yes    Comment: ocassionaly   Drug use: No     Allergies   Keflex  [cephalexin ] and Levofloxacin    Review of Systems Review of Systems  Constitutional:  Negative for activity change, appetite change, chills, fatigue and fever.  Respiratory:  Negative for chest tightness, shortness of breath and wheezing.   Cardiovascular:  Positive for leg swelling. Negative for chest pain and palpitations.  Musculoskeletal:  Positive for myalgias. Negative for arthralgias.  Skin:  Negative for color change.  Neurological:  Negative for dizziness, weakness, light-headedness, numbness and headaches.  Hematological:  Negative for adenopathy. Does not bruise/bleed easily.  Psychiatric/Behavioral:  Negative for sleep disturbance.      Physical Exam Triage Vital Signs ED Triage Vitals  Encounter Vitals Group     BP 11/12/23 1224 (!) 142/87     Systolic BP Percentile --      Diastolic BP Percentile --      Pulse Rate 11/12/23 1223 67     Resp 11/12/23 1223 17     Temp 11/12/23 1223 98.6 F (37 C)     Temp Source 11/12/23 1223 Oral     SpO2 11/12/23 1223 96 %     Weight --      Height --      Head Circumference --      Peak Flow --      Pain Score 11/12/23 1223 4     Pain Loc --      Pain Education --      Exclude from Growth Chart --    No data found.  Updated Vital Signs BP (!) 142/87   Pulse 67   Temp 98.6 F (37 C) (Oral)   Resp 17  SpO2 96%   Visual  Acuity Right Eye Distance:   Left Eye Distance:   Bilateral Distance:    Right Eye Near:   Left Eye Near:    Bilateral Near:     Physical Exam Vitals and nursing note reviewed.  Constitutional:      General: She is not in acute distress.    Appearance: Normal appearance. She is not ill-appearing.  HENT:     Head: Normocephalic and atraumatic.  Eyes:     General: No scleral icterus.    Extraocular Movements: Extraocular movements intact.     Conjunctiva/sclera: Conjunctivae normal.  Cardiovascular:     Rate and Rhythm: Normal rate and regular rhythm.     Heart sounds: No murmur heard. Pulmonary:     Effort: Pulmonary effort is normal. No respiratory distress.     Breath sounds: Normal breath sounds. No wheezing or rales.  Abdominal:     General: There is no distension.     Tenderness: There is no abdominal tenderness. There is no right CVA tenderness, left CVA tenderness, guarding or rebound.  Musculoskeletal:     Cervical back: Normal range of motion. No rigidity.     Right lower leg: Swelling present. No tenderness. Edema present.     Left lower leg: Swelling present. No tenderness. Edema present.     Comments: Non-pitting edema b/l lower legs Mild tenderness along b/l ankles No calf tenderness No erythema Homan's negative  Lymphadenopathy:     Cervical: No cervical adenopathy.  Skin:    Coloration: Skin is not jaundiced.     Findings: No rash.  Neurological:     General: No focal deficit present.     Mental Status: She is alert and oriented to person, place, and time.     Motor: No weakness.     Gait: Gait normal.  Psychiatric:        Mood and Affect: Mood normal.        Behavior: Behavior normal.      UC Treatments / Results  Labs (all labs ordered are listed, but only abnormal results are displayed) Labs Reviewed - No data to display  EKG   Radiology No results found.  Procedures Procedures (including critical care time)  Medications Ordered in  UC Medications - No data to display  Initial Impression / Assessment and Plan / UC Course  I have reviewed the triage vital signs and the nursing notes.  Pertinent labs & imaging results that were available during my care of the patient were reviewed by me and considered in my medical decision making (see chart for details).     DVT unlikely She had full cardiac workup recently, negative for CHF Unknown cause, patient declines ECG, CXR, lab workup Follow up with PCP / cardiologist ED precautions provided Final Clinical Impressions(s) / UC Diagnoses   Final diagnoses:  Bilateral leg edema     Discharge Instructions      Keep legs elevated Follow up with cardiologist / PCP Go to ED if symptoms worsen   ED Prescriptions   None    PDMP not reviewed this encounter.   Juleen Rush, PA-C 11/12/23 1316

## 2023-11-12 NOTE — ED Triage Notes (Signed)
 Pt presents with c/o leg swelling x 2 days. States both of her legs hurt and states she has trouble walking.

## 2023-11-12 NOTE — Discharge Instructions (Signed)
 Keep legs elevated Follow up with cardiologist / PCP Go to ED if symptoms worsen

## 2023-11-12 NOTE — Telephone Encounter (Signed)
 Pt was called by CNM and pt presented to Urgent Care for evaluation. Carla Huffman

## 2023-11-12 NOTE — Telephone Encounter (Signed)
  CNM telephoned patient. Patient states now that she has thought about it, her feet were actually swollen even prior to starting Macrobid  and Pyridium . Swelling today is worse than before starting the medication. States today I do not have ankles. Patient will discontinue Macrobid  and Pyridium  and we will await urine culture. She will report to Urgent Care immediately. Work note sent. Carla Huffman

## 2023-11-15 ENCOUNTER — Telehealth (HOSPITAL_BASED_OUTPATIENT_CLINIC_OR_DEPARTMENT_OTHER): Payer: Self-pay | Admitting: Certified Nurse Midwife

## 2023-11-15 LAB — CYTOLOGY - PAP
Adequacy: ABSENT
Chlamydia: NEGATIVE
Comment: NEGATIVE
Comment: NEGATIVE
Comment: NEGATIVE
Comment: NORMAL
Diagnosis: NEGATIVE
High risk HPV: NEGATIVE
Neisseria Gonorrhea: NEGATIVE
Trichomonas: NEGATIVE

## 2023-11-15 NOTE — Telephone Encounter (Signed)
 CNM called patient at 8:05am. Pt states swelling in lower extremities has resolved. Pt states she had visited [place] with friends which involved a lot of walking and increased sodium in her diet. She now thinks it was probably the walking and salt that contributed to the swelling in her feet. She is home now and back to trying to eat a healthy low-sodium diet. Swelling has resolved. She will resume Macroid 100mg  po BID for UTI.  Yolanda Hence

## 2023-11-18 ENCOUNTER — Encounter (HOSPITAL_BASED_OUTPATIENT_CLINIC_OR_DEPARTMENT_OTHER): Payer: Self-pay | Admitting: Family Medicine

## 2024-01-04 ENCOUNTER — Other Ambulatory Visit (HOSPITAL_BASED_OUTPATIENT_CLINIC_OR_DEPARTMENT_OTHER): Payer: Self-pay | Admitting: Family Medicine

## 2024-01-06 ENCOUNTER — Other Ambulatory Visit (HOSPITAL_COMMUNITY): Payer: Self-pay

## 2024-03-16 ENCOUNTER — Encounter (HOSPITAL_BASED_OUTPATIENT_CLINIC_OR_DEPARTMENT_OTHER): Payer: Self-pay | Admitting: Family Medicine

## 2024-03-16 ENCOUNTER — Ambulatory Visit (HOSPITAL_BASED_OUTPATIENT_CLINIC_OR_DEPARTMENT_OTHER): Admitting: Family Medicine

## 2024-03-16 VITALS — BP 134/86 | HR 86 | Ht 71.0 in | Wt 329.5 lb

## 2024-03-16 DIAGNOSIS — E039 Hypothyroidism, unspecified: Secondary | ICD-10-CM

## 2024-03-16 DIAGNOSIS — R051 Acute cough: Secondary | ICD-10-CM | POA: Diagnosis not present

## 2024-03-16 LAB — POC COVID19 BINAXNOW: SARS Coronavirus 2 Ag: NEGATIVE

## 2024-03-16 MED ORDER — TIRZEPATIDE-WEIGHT MANAGEMENT 2.5 MG/0.5ML ~~LOC~~ SOAJ: 2.5000 mg | SUBCUTANEOUS | 1 refills | Status: DC

## 2024-03-16 NOTE — Assessment & Plan Note (Signed)
 Recent TSH was at goal.  Patient continues with same dose of levothyroxine .  Denies any issues at this time.

## 2024-03-16 NOTE — Patient Instructions (Signed)
  Medication Instructions:  Your physician recommends that you continue on your current medications as directed. Please refer to the Current Medication list given to you today. --If you need a refill on any your medications before your next appointment, please call your pharmacy first. If no refills are authorized on file call the office.--   Follow-Up: Your next appointment:   Your physician recommends that you schedule a follow-up appointment in: 1 month follow up can be virtual  with Dr. de Peru  You will receive a text message or e-mail with a link to a survey about your care and experience with us  today! We would greatly appreciate your feedback!   Thanks for letting us  be apart of your health journey!!  Primary Care and Sports Medicine   Dr. Court Distance Peru   We encourage you to activate your patient portal called "MyChart".  Sign up information is provided on this After Visit Summary.  MyChart is used to connect with patients for Virtual Visits (Telemedicine).  Patients are able to view lab/test results, encounter notes, upcoming appointments, etc.  Non-urgent messages can be sent to your provider as well. To learn more about what you can do with MyChart, please visit --  [email or web address].

## 2024-03-16 NOTE — Assessment & Plan Note (Signed)
 Patient with recent onset of sinus congestion, primarily affecting the right side of sinuses, associated cough.  Has not had any fever, chills, body aches, trouble breathing or shortness of breath.  She is not aware of any sick contacts.  No recent travel.  Has been utilizing some OTC medications without significant relief. On exam, patient is in no acute distress, vital signs stable.  Cardiovascular Sam with regular rate and rhythm, lungs clear to auscultation bilaterally. Mild pharyngeal erythema, no significant cervical lymphadenopathy. Suspect acute viral illness.  COVID swab in office today was negative.  Discussed recommendations related to conservative measures, OTC medications.  Can utilize intranasal steroid spray, nasal saline spray.  Consider utilizing honey to help with cough.  If utilizing Mucinex , ensure drinking plenty of fluids.  Cautioned on potential transition to bacterial infection, discussed this in office today.  Recommend returning to the office for further evaluation should symptoms persist or worsen.

## 2024-03-16 NOTE — Assessment & Plan Note (Signed)
 Long discussion today reviewing weight loss medications including injectable options including GLP-1 receptor agonist, oral agents including Contrave, orlistat, phentermine.  We discussed potential risk, benefits, cost associated with these various medications as well as the possibility of insurance coverage or lack thereof.  We discussed typical dosing regimen for both injectable and oral medications, proper administration of each.  We discussed potential outcomes with each. After discussion of potential risks and adverse reactions with these medications and potential benefits of each, patient would like to proceed with injectable GLP-1 receptor agonist if possible.  Prescription sent to pharmacy on file, if medication is cost prohibitive for patient, we can look to send an alternative option.  Will plan for close follow-up to monitor response to whichever medication patient is able to initiate. Additional consideration is for referral to healthy weight and wellness clinic

## 2024-03-16 NOTE — Progress Notes (Signed)
    Procedures performed today:    None.  Independent interpretation of notes and tests performed by another provider:   None.  Brief History, Exam, Impression, and Recommendations:    BP 134/86 (BP Location: Left Arm, Patient Position: Sitting, Cuff Size: Large)   Pulse 86   Ht 5' 11 (1.803 m)   Wt (!) 329 lb 8 oz (149.5 kg)   SpO2 97%   BMI 45.96 kg/m   Acute cough Assessment & Plan: Patient with recent onset of sinus congestion, primarily affecting the right side of sinuses, associated cough.  Has not had any fever, chills, body aches, trouble breathing or shortness of breath.  She is not aware of any sick contacts.  No recent travel.  Has been utilizing some OTC medications without significant relief. On exam, patient is in no acute distress, vital signs stable.  Cardiovascular Sam with regular rate and rhythm, lungs clear to auscultation bilaterally. Mild pharyngeal erythema, no significant cervical lymphadenopathy. Suspect acute viral illness.  COVID swab in office today was negative.  Discussed recommendations related to conservative measures, OTC medications.  Can utilize intranasal steroid spray, nasal saline spray.  Consider utilizing honey to help with cough.  If utilizing Mucinex , ensure drinking plenty of fluids.  Cautioned on potential transition to bacterial infection, discussed this in office today.  Recommend returning to the office for further evaluation should symptoms persist or worsen.  Orders: -     POC COVID-19 BinaxNow  Hypothyroidism, unspecified type Assessment & Plan: Recent TSH was at goal.  Patient continues with same dose of levothyroxine .  Denies any issues at this time.   Severe obesity (BMI >= 40) (HCC) Assessment & Plan: Long discussion today reviewing weight loss medications including injectable options including GLP-1 receptor agonist, oral agents including Contrave, orlistat, phentermine.  We discussed potential risk, benefits, cost  associated with these various medications as well as the possibility of insurance coverage or lack thereof.  We discussed typical dosing regimen for both injectable and oral medications, proper administration of each.  We discussed potential outcomes with each. After discussion of potential risks and adverse reactions with these medications and potential benefits of each, patient would like to proceed with injectable GLP-1 receptor agonist if possible.  Prescription sent to pharmacy on file, if medication is cost prohibitive for patient, we can look to send an alternative option.  Will plan for close follow-up to monitor response to whichever medication patient is able to initiate. Additional consideration is for referral to healthy weight and wellness clinic  Orders: -     Tirzepatide-Weight Management; Inject 2.5 mg into the skin once a week.  Dispense: 2 mL; Refill: 1  Return in about 4 weeks (around 04/13/2024) for med check, can be virtual.   ___________________________________________ Suella Cogar de [place], MD, ABFM, CAQSM Primary Care and Sports Medicine [facility]

## 2024-03-20 ENCOUNTER — Encounter (HOSPITAL_BASED_OUTPATIENT_CLINIC_OR_DEPARTMENT_OTHER): Payer: Self-pay | Admitting: *Deleted

## 2024-03-20 ENCOUNTER — Telehealth: Payer: Self-pay

## 2024-03-20 MED ORDER — BENZONATATE 200 MG PO CAPS: 200.0000 mg | ORAL_CAPSULE | Freq: Three times a day (TID) | ORAL | 0 refills | Status: DC | PRN

## 2024-03-20 NOTE — Telephone Encounter (Signed)
 Copied from CRM [id number]. Topic: Clinical - Medical Advice >> Mar 20, 2024  8:29 AM Chasity T wrote: Reason for CRM: Patient is calling in to speak with Shanon because she was seen in office last week and was prescribed mucinex  but it has not helped with her cough. She is wanting to know if she can get prescribed something else to help her. Please contact her at [phone].

## 2024-03-20 NOTE — Telephone Encounter (Signed)
 LVM for patient to call the office  Mychart msg sent

## 2024-04-13 ENCOUNTER — Telehealth (HOSPITAL_BASED_OUTPATIENT_CLINIC_OR_DEPARTMENT_OTHER): Admitting: Family Medicine

## 2024-04-13 MED ORDER — ZEPBOUND 5 MG/0.5ML ~~LOC~~ SOAJ
5.0000 mg | SUBCUTANEOUS | 1 refills | Status: DC
Start: 2024-04-13 — End: 2024-05-10

## 2024-04-13 NOTE — Assessment & Plan Note (Signed)
 Patient was able to start with Zepbound  at 2.5 mg dose.  She reports that she has been tolerating this well, denies any significant side effects.  She has noted some appetite suppression.  She may have mild nausea in the morning, however this quickly resolves.  She indicates that she has lost about 12 pounds since last appointment.  She has administered 3 doses thus far. We discussed considerations and she would like to titrate dose, we will send prescription for 5 mg dosage.  Instructed on completing fourth dose of 2.5 mg dosage and then can transition to 5 mg dosage.  We will plan to follow-up again in about a month to assess progress and monitor for any side effects.  Next follow-up can be virtual and then likely the next follow-up after that can be in the office.

## 2024-04-13 NOTE — Progress Notes (Signed)
   Virtual Visit   I connected with  Carla Huffman  on 04/13/24 by telephone/telehealth and verified that I am speaking with the correct person using two identifiers. Visit completed via video.   I discussed the limitations, risks, security and privacy concerns of performing an evaluation and management service by telephone, including the higher likelihood of inaccurate diagnosis and treatment, and the availability of in person appointments.  We also discussed the likely need of an additional face to face encounter for complete and high quality delivery of care.  I also discussed with the patient that there may be a patient responsible charge related to this service. The patient expressed understanding and wishes to proceed.  Provider location is in medical facility. Patient location is at their home, different from provider location. People involved in care of the patient during this telehealth encounter were myself, my nurse/medical assistant, and my front office/scheduling team member.  Review of Systems: No fevers, chills, night sweats, weight loss, chest pain, or shortness of breath.   Objective Findings:    General: Speaking full sentences, no audible heavy breathing.  Sounds alert and appropriately interactive.    Independent interpretation of tests performed by another provider:   None.  Brief History, Exam, Impression, and Recommendations:    Severe obesity (BMI >= 40) (HCC) Patient was able to start with Zepbound  at 2.5 mg dose.  She reports that she has been tolerating this well, denies any significant side effects.  She has noted some appetite suppression.  She may have mild nausea in the morning, however this quickly resolves.  She indicates that she has lost about 12 pounds since last appointment.  She has administered 3 doses thus far. We discussed considerations and she would like to titrate dose, we will send prescription for 5 mg dosage.  Instructed on completing fourth  dose of 2.5 mg dosage and then can transition to 5 mg dosage.  We will plan to follow-up again in about a month to assess progress and monitor for any side effects.  Next follow-up can be virtual and then likely the next follow-up after that can be in the office.  I discussed the above assessment and treatment plan with the patient. The patient was provided an opportunity to ask questions and all were answered. The patient agreed with the plan and demonstrated an understanding of the instructions.   The patient was advised to call back or seek an in-person evaluation if the symptoms worsen or if the condition fails to improve as anticipated.   I provided 12 minutes of face to face and non-face-to-face time during this encounter date, time was needed to gather information, review chart, records, communicate/coordinate with staff remotely, as well as complete documentation.   ___________________________________________ Shelli Portilla de [place], MD, ABFM, CAQSM Primary Care and Sports Medicine [facility]

## 2024-05-10 ENCOUNTER — Telehealth (HOSPITAL_BASED_OUTPATIENT_CLINIC_OR_DEPARTMENT_OTHER): Admitting: Family Medicine

## 2024-05-10 ENCOUNTER — Encounter (HOSPITAL_BASED_OUTPATIENT_CLINIC_OR_DEPARTMENT_OTHER): Payer: Self-pay | Admitting: Family Medicine

## 2024-05-10 DIAGNOSIS — Z6841 Body Mass Index (BMI) 40.0 and over, adult: Secondary | ICD-10-CM

## 2024-05-10 MED ORDER — ZEPBOUND 5 MG/0.5ML ~~LOC~~ SOAJ: 5.0000 mg | SUBCUTANEOUS | 1 refills | Status: DC

## 2024-05-10 NOTE — Assessment & Plan Note (Signed)
 Patient reports that she has been doing well with medication.  She is currently administering 5 mg dose, has administered 3 doses thus far.  She notes some mild nausea for the first few days after administering the injection, however denies any other side effects or symptoms.  She has noted more than 20 pound weight loss since starting medication. Starting weight: 329 pounds Current weight: 309 pounds, measured at home We discussed considerations today related to management of medication.  We discussed options of increasing dose, continuing with same dose.  Given progress that she has seen thus far with medication, would be reasonable to continue with the same dose and monitor progress moving forward.  If plateauing does occur or if patient desires, dose can be increased.  Did caution that with increase in dose, we would need to monitor for any more notable side effects. Will plan to see each other again in about 1 to 2 months in the office in order to monitor weight

## 2024-05-10 NOTE — Progress Notes (Signed)
   Virtual Visit   I connected with  Carla Huffman  on 05/10/24 by telephone/telehealth and verified that I am speaking with the correct person using two identifiers. Visit completed via video.   I discussed the limitations, risks, security and privacy concerns of performing an evaluation and management service by telephone, including the higher likelihood of inaccurate diagnosis and treatment, and the availability of in person appointments.  We also discussed the likely need of an additional face to face encounter for complete and high quality delivery of care.  I also discussed with the patient that there may be a patient responsible charge related to this service. The patient expressed understanding and wishes to proceed.  Provider location is in medical facility. Patient location is at their home, different from provider location. People involved in care of the patient during this telehealth encounter were myself, my nurse/medical assistant, and my front office/scheduling team member.  Review of Systems: No fevers, chills, night sweats, weight loss, chest pain, or shortness of breath.   Objective Findings:    General: Speaking full sentences, no audible heavy breathing.  Sounds alert and appropriately interactive.    Independent interpretation of tests performed by another provider:   None.  Brief History, Exam, Impression, and Recommendations:    Severe obesity (BMI >= 40) (HCC) Patient reports that she has been doing well with medication.  She is currently administering 5 mg dose, has administered 3 doses thus far.  She notes some mild nausea for the first few days after administering the injection, however denies any other side effects or symptoms.  She has noted more than 20 pound weight loss since starting medication. Starting weight: 329 pounds Current weight: 309 pounds, measured at home We discussed considerations today related to management of medication.  We discussed  options of increasing dose, continuing with same dose.  Given progress that she has seen thus far with medication, would be reasonable to continue with the same dose and monitor progress moving forward.  If plateauing does occur or if patient desires, dose can be increased.  Did caution that with increase in dose, we would need to monitor for any more notable side effects. Will plan to see each other again in about 1 to 2 months in the office in order to monitor weight  I discussed the above assessment and treatment plan with the patient. The patient was provided an opportunity to ask questions and all were answered. The patient agreed with the plan and demonstrated an understanding of the instructions.  The patient was advised to call back or seek an in-person evaluation if the symptoms worsen or if the condition fails to improve as anticipated.  I provided 12 minutes of face to face and non-face-to-face time during this encounter date, time was needed to gather information, review chart, records, communicate/coordinate with staff remotely, as well as complete documentation.   ___________________________________________ Danaye Sobh de [place], MD, ABFM, CAQSM Primary Care and Sports Medicine [facility]

## 2024-05-23 ENCOUNTER — Ambulatory Visit (HOSPITAL_BASED_OUTPATIENT_CLINIC_OR_DEPARTMENT_OTHER): Admitting: Family Medicine

## 2024-05-23 ENCOUNTER — Encounter (HOSPITAL_BASED_OUTPATIENT_CLINIC_OR_DEPARTMENT_OTHER): Payer: Self-pay | Admitting: Family Medicine

## 2024-05-23 ENCOUNTER — Other Ambulatory Visit (HOSPITAL_BASED_OUTPATIENT_CLINIC_OR_DEPARTMENT_OTHER): Payer: Self-pay

## 2024-05-23 ENCOUNTER — Ambulatory Visit: Payer: Self-pay

## 2024-05-23 VITALS — BP 133/84 | HR 81 | Ht 71.0 in | Wt 312.3 lb

## 2024-05-23 DIAGNOSIS — L237 Allergic contact dermatitis due to plants, except food: Secondary | ICD-10-CM | POA: Diagnosis not present

## 2024-05-23 MED ORDER — PREDNISONE 10 MG PO TABS
ORAL_TABLET | ORAL | 0 refills | Status: AC
  Filled 2024-05-23: qty 40, 16d supply, fill #0

## 2024-05-23 MED ORDER — TRIAMCINOLONE ACETONIDE 0.1 % EX CREA
1.0000 | TOPICAL_CREAM | Freq: Two times a day (BID) | CUTANEOUS | 1 refills | Status: DC
  Filled 2024-05-23: qty 45, 30d supply, fill #0

## 2024-05-23 NOTE — Telephone Encounter (Signed)
 FYI Only or Action Required?: Action required by provider: request for appointment. Pt would like to be seen today. Pt is requesting a call back regarding appt.  Patient was last seen in primary care on 05/10/2024 by de Peru, Quintin PARAS, MD.  Called Nurse Triage reporting [facility].  Symptoms began several days ago.  Interventions attempted: OTC medications: benadryl  cream .  Symptoms are: gradually worsening.  Triage Disposition: See Physician Within 24 Hours  Patient/caregiver understands and will follow disposition?: Yes                  Copied from CRM #[id number]. Topic: Clinical - Red Word Triage >> May 23, 2024  8:50 AM Turkey B wrote: [facility] that prompted transfer to Nurse Triage: Patient has poison ivy, with rash on left side of face and spreading Reason for Disposition  MODERATE to SEVERE itching (e.g., interferes with work, school, sleep, or other activities)  Answer Assessment - Initial Assessment Questions 1. APPEARANCE of RASH: What does the rash look like?      Red  2. LOCATION: Where is the rash located?  (e.g., face, genitals, hands, legs)     Left side of her face - right arm, right side. 3. SIZE: How large is the rash?      Entire right side of torso 4. ONSET: When did the rash begin?      Saturday 5. ITCHING: Does the rash itch? If Yes, ask: How bad is it?     yes 6. EXPOSURE:  How were you exposed to the plant (poison ivy, poison oak, sumac)  When were you exposed?  Note: Sometimes a poison ivy/oak/sumac rash does not appear for 2 to 3 weeks after exposure.      Yes - husband 7. PAST HISTORY: Have you had a poison ivy rash before? If Yes, ask: How bad was it?     Yes - as a child 8. OTHER SYMPTOMS: Do you have any other symptoms? (e.g., fever)      no  Protocols used: Poison Ivy - Oak - Sumac-A-AH

## 2024-05-23 NOTE — Assessment & Plan Note (Signed)
 Patient reports that about 3 days ago, she came into contact with poison ivy via her husband.  Areas affected include left side of face/neck, right wrist, right flank/trunk.  She has been trying OTC measures including topical Benadryl , calamine lotion, oral Benadryl .  Has had minimal relief with these.  No other symptoms, denies any issues with fever, chills, sweats, fatigue. On exam, patient is in no acute distress, vital signs stable.  She does have erythematous, raised areas noted over left side of face/neck, around the right wrist, along right side of trunk.  There is no current oozing or drainage. We discussed considerations.  Given exposure, suspect that current symptoms are related to a poison ivy dermatitis.  Less likely, this could be related to herpes zoster, however pattern is not consistent with that given affected areas.  As such, would proceed with management as poison ivy dermatitis.  We discussed options, given areas involved including aspects of face, wrist, near hand, would be reasonable to proceed with steroid taper, we can also utilize topical steroid to try and help with associated symptoms including itching.  Advised on use of both medication, discussed potential risk benefits with medication, side effects to be mindful of.  We discussed OTC measures which can be helpful, reviewed calamine lotion, cool wet compress, oatmeal bath.  Will plan to follow-up as needed

## 2024-05-23 NOTE — Telephone Encounter (Signed)
 Patient scheduled acute visit 8/19

## 2024-05-23 NOTE — Patient Instructions (Signed)

## 2024-05-23 NOTE — Progress Notes (Signed)
    Procedures performed today:    None.  Independent interpretation of notes and tests performed by another provider:   None.  Brief History, Exam, Impression, and Recommendations:    BP 133/84 (BP Location: Right Arm, Patient Position: Sitting, Cuff Size: Large)   Pulse 81   Ht 5' 11 (1.803 m)   Wt (!) 312 lb 4.8 oz (141.7 kg)   SpO2 94%   BMI 43.56 kg/m   Poison ivy dermatitis Assessment & Plan: Patient reports that about 3 days ago, she came into contact with poison ivy via her husband.  Areas affected include left side of face/neck, right wrist, right flank/trunk.  She has been trying OTC measures including topical Benadryl , calamine lotion, oral Benadryl .  Has had minimal relief with these.  No other symptoms, denies any issues with fever, chills, sweats, fatigue. On exam, patient is in no acute distress, vital signs stable.  She does have erythematous, raised areas noted over left side of face/neck, around the right wrist, along right side of trunk.  There is no current oozing or drainage. We discussed considerations.  Given exposure, suspect that current symptoms are related to a poison ivy dermatitis.  Less likely, this could be related to herpes zoster, however pattern is not consistent with that given affected areas.  As such, would proceed with management as poison ivy dermatitis.  We discussed options, given areas involved including aspects of face, wrist, near hand, would be reasonable to proceed with steroid taper, we can also utilize topical steroid to try and help with associated symptoms including itching.  Advised on use of both medication, discussed potential risk benefits with medication, side effects to be mindful of.  We discussed OTC measures which can be helpful, reviewed calamine lotion, cool wet compress, oatmeal bath.  Will plan to follow-up as needed   Other orders -     predniSONE ; Take 4 tablets (40 mg total) by mouth daily with breakfast for 4 days, THEN 3  tablets (30 mg total) daily with breakfast for 4 days, THEN 2 tablets (20 mg total) daily with breakfast for 4 days, THEN 1 tablet (10 mg total) daily with breakfast for 4 days.  Dispense: 40 tablet; Refill: 0 -     Triamcinolone  Acetonide; Apply 1 Application topically 2 (two) times daily.  Dispense: 45 g; Refill: 1  Return if symptoms worsen or fail to improve.   ___________________________________________ Marlenne Ridge de Peru, MD, ABFM, CAQSM Primary Care and Sports Medicine [facility]

## 2024-06-09 ENCOUNTER — Encounter (HOSPITAL_BASED_OUTPATIENT_CLINIC_OR_DEPARTMENT_OTHER): Payer: Self-pay | Admitting: Family Medicine

## 2024-06-09 ENCOUNTER — Ambulatory Visit (HOSPITAL_BASED_OUTPATIENT_CLINIC_OR_DEPARTMENT_OTHER): Admitting: Family Medicine

## 2024-06-09 MED ORDER — ZEPBOUND 7.5 MG/0.5ML ~~LOC~~ SOAJ: 7.5000 mg | SUBCUTANEOUS | 2 refills | Status: DC

## 2024-06-09 NOTE — Assessment & Plan Note (Addendum)
 Patient reports that she has been doing well with medication.  She is currently administering 5 mg dose, has administered 3 doses thus far.  She notes some mild nausea for the first few days after administering the injection, however denies any other side effects or symptoms.  She has noted more than 20 pound weight loss since starting medication. Starting weight: 329 pounds Current weight: 305 pounds, measured at home We discussed considerations today related to management of medication.  We discussed options of increasing dose, continuing with same dose.  At this time, she would like to titrate dose of medication to 7.5 mg.  I do feel this is reasonable.  Discussed potential risks with medication and that with going up on dose, there is the possibility of noticing more side effects.  Patient voiced understanding Will plan for follow-up in 1 month

## 2024-06-09 NOTE — Progress Notes (Signed)
    Procedures performed today:    None.  Independent interpretation of notes and tests performed by another provider:   None.  Brief History, Exam, Impression, and Recommendations:    BP 126/86 (BP Location: Left Arm, Patient Position: Sitting, Cuff Size: Large)   Pulse 83   Ht 5' 11 (1.803 m)   Wt (!) 305 lb 12.8 oz (138.7 kg)   SpO2 97%   BMI 42.65 kg/m   Severe obesity (BMI >= 40) (HCC) Assessment & Plan: Patient reports that she has been doing well with medication.  She is currently administering 5 mg dose, has administered 3 doses thus far.  She notes some mild nausea for the first few days after administering the injection, however denies any other side effects or symptoms.  She has noted more than 20 pound weight loss since starting medication. Starting weight: 329 pounds Current weight: 305 pounds, measured at home We discussed considerations today related to management of medication.  We discussed options of increasing dose, continuing with same dose.  At this time, she would like to titrate dose of medication to 7.5 mg.  I do feel this is reasonable.  Discussed potential risks with medication and that with going up on dose, there is the possibility of noticing more side effects.  Patient voiced understanding Will plan for follow-up in 1 month  Orders: -     Zepbound ; Inject 7.5 mg into the skin once a week.  Dispense: 2 mL; Refill: 2  Return in about 1 month (around 07/09/2024) for med check, can be virtual.   ___________________________________________ Special Ranes de Peru, MD, ABFM, CAQSM Primary Care and Sports Medicine [facility]

## 2024-06-09 NOTE — Patient Instructions (Signed)
  Medication Instructions:  Your physician recommends that you continue on your current medications as directed. Please refer to the Current Medication list given to you today. --If you need a refill on any your medications before your next appointment, please call your pharmacy first. If no refills are authorized on file call the office.--   Follow-Up: Your next appointment:   Your physician recommends that you schedule a follow-up appointment in: 1 month follow up can be virtual  with Dr. de Peru  You will receive a text message or e-mail with a link to a survey about your care and experience with us  today! We would greatly appreciate your feedback!   Thanks for letting us  be apart of your health journey!!  Primary Care and Sports Medicine   Dr. Court Distance Peru   We encourage you to activate your patient portal called "MyChart".  Sign up information is provided on this After Visit Summary.  MyChart is used to connect with patients for Virtual Visits (Telemedicine).  Patients are able to view lab/test results, encounter notes, upcoming appointments, etc.  Non-urgent messages can be sent to your provider as well. To learn more about what you can do with MyChart, please visit --  [email or web address].

## 2024-07-03 ENCOUNTER — Ambulatory Visit (HOSPITAL_BASED_OUTPATIENT_CLINIC_OR_DEPARTMENT_OTHER): Admitting: Family Medicine

## 2024-07-03 ENCOUNTER — Other Ambulatory Visit (HOSPITAL_BASED_OUTPATIENT_CLINIC_OR_DEPARTMENT_OTHER): Payer: Self-pay

## 2024-07-03 ENCOUNTER — Other Ambulatory Visit: Payer: Self-pay

## 2024-07-03 ENCOUNTER — Encounter (HOSPITAL_BASED_OUTPATIENT_CLINIC_OR_DEPARTMENT_OTHER): Payer: Self-pay | Admitting: Family Medicine

## 2024-07-03 VITALS — BP 129/81 | HR 77 | Ht 71.0 in | Wt 299.8 lb

## 2024-07-03 DIAGNOSIS — M25512 Pain in left shoulder: Secondary | ICD-10-CM | POA: Diagnosis not present

## 2024-07-03 MED ORDER — MELOXICAM 7.5 MG PO TABS
7.5000 mg | ORAL_TABLET | Freq: Every day | ORAL | 1 refills | Status: AC
  Filled 2024-07-03: qty 60, 30d supply, fill #0
  Filled 2024-07-03: qty 20, 10d supply, fill #0

## 2024-07-03 NOTE — Assessment & Plan Note (Addendum)
 Patient reports that she has been doing well with medication.  She is currently administering 5 mg dose, has administered 3 doses thus far.  She notes some mild nausea for the first few days after administering the injection, however denies any other side effects or symptoms.  She has noted more than 20 pound weight loss since starting medication. Starting weight: 329 pounds Current weight: 299 pounds  - Continue Zepbound  7.5 mg weekly. - Consider increasing to 10 mg after fourth dose if tolerated; she will message if ready. - Plan follow-up in six weeks to assess progress and tolerance at 10 mg dose.

## 2024-07-03 NOTE — Progress Notes (Signed)
    Procedures performed today:    None.  Independent interpretation of notes and tests performed by another provider:   None.  Brief History, Exam, Impression, and Recommendations:    BP 129/81 (BP Location: Right Arm, Patient Position: Sitting, Cuff Size: Normal)   Pulse 77   Ht 5' 11 (1.803 m)   Wt 299 lb 12.8 oz (136 kg)   SpO2 95%   BMI 41.81 kg/m   Discussed the use of AI scribe software for clinical note transcription with the patient, who gave verbal consent to proceed.  History of Present Illness Shanise Pounds is a 53 year old female who presents with acute onset back pain.  She experienced severe back pain upon waking on Sunday, initially rated as ten out of ten, decreasing to four out of ten as the day progressed. The pain is localized around the shoulder blade area and worsens with coughing and deep breathing. It is alleviated when she minimizes arm movement.  She recalls engaging in painting activities prior to the onset of pain but does not associate any specific incident with the start of her symptoms. She has no history of similar pain.  She has attempted to manage the pain with over-the-counter ibuprofen , taking 600-800 mg, but reports minimal relief.  She denies any new rash or significant gastrointestinal symptoms.  She is currently on Zepbound , having completed two doses of 7.5 mg, and is preparing for her third dose. She reports feeling slightly tired after the initial doses but no other significant side effects. She has experienced some weight loss with the medication and aims to avoid needing a seatbelt extension on a flight in January.  Periscapular pain of left shoulder Assessment & Plan: - Refer to physical therapy for ultrasound therapy, dry needling, and soft tissue mobilization. - Prescribe meloxicam  7.5 mg daily, increase to 15 mg if needed, discontinue ibuprofen  if starting meloxicam . - Recommend Tylenol  for additional pain relief in the  evening. - Suggest heat or ice application based on comfort. - Encourage maintaining shoulder range of motion to prevent frozen shoulder. - Send referral to physical therapy, option to switch clinics if wait times are long.  Orders: -     Ambulatory referral to Physical Therapy  Severe obesity (BMI >= 40) (HCC) Assessment & Plan: Patient reports that she has been doing well with medication.  She is currently administering 5 mg dose, has administered 3 doses thus far.  She notes some mild nausea for the first few days after administering the injection, however denies any other side effects or symptoms.  She has noted more than 20 pound weight loss since starting medication. Starting weight: 329 pounds Current weight: 299 pounds  - Continue Zepbound  7.5 mg weekly. - Consider increasing to 10 mg after fourth dose if tolerated; she will message if ready. - Plan follow-up in six weeks to assess progress and tolerance at 10 mg dose.   Other orders -     Meloxicam ; Take 1-2 tablets (7.5-15 mg total) by mouth daily.  Dispense: 60 tablet; Refill: 1  Return in about 6 weeks (around 08/14/2024) for med check.   ___________________________________________ Yeraldin Litzenberger de [place], MD, ABFM, CAQSM Primary Care and Sports Medicine [facility]

## 2024-07-03 NOTE — Patient Instructions (Signed)
  Medication Instructions:  Your physician recommends that you continue on your current medications as directed. Please refer to the Current Medication list given to you today. --If you need a refill on any your medications before your next appointment, please call your pharmacy first. If no refills are authorized on file call the office.--   Follow-Up: Your next appointment:   Your physician recommends that you schedule a follow-up appointment in: 6 week follow up  with Dr. de [place]  You will receive a text message or e-mail with a link to a survey about your care and experience with [place] today! We would greatly appreciate your feedback!   Thanks for letting us be apart of your health journey!!  Primary Care and Sports Medicine   Dr. Ceasar Mons [place]   We encourage you to activate your patient portal called "MyChart".  Sign up information is provided on this After Visit Summary.  MyChart is used to connect with patients for Virtual Visits (Telemedicine).  Patients are able to view lab/test results, encounter notes, upcoming appointments, etc.  Non-urgent messages can be sent to your provider as well. To learn more about what you can do with MyChart, please visit --  [email or web address].

## 2024-07-03 NOTE — Assessment & Plan Note (Signed)
-   Refer to physical therapy for ultrasound therapy, dry needling, and soft tissue mobilization. - Prescribe meloxicam  7.5 mg daily, increase to 15 mg if needed, discontinue ibuprofen  if starting meloxicam . - Recommend Tylenol  for additional pain relief in the evening. - Suggest heat or ice application based on comfort. - Encourage maintaining shoulder range of motion to prevent frozen shoulder. - Send referral to physical therapy, option to switch clinics if wait times are long.

## 2024-07-04 ENCOUNTER — Other Ambulatory Visit (HOSPITAL_COMMUNITY): Payer: Self-pay

## 2024-07-05 ENCOUNTER — Encounter (HOSPITAL_BASED_OUTPATIENT_CLINIC_OR_DEPARTMENT_OTHER): Payer: Self-pay | Admitting: Certified Nurse Midwife

## 2024-07-06 ENCOUNTER — Ambulatory Visit (INDEPENDENT_AMBULATORY_CARE_PROVIDER_SITE_OTHER)

## 2024-07-06 ENCOUNTER — Other Ambulatory Visit (HOSPITAL_BASED_OUTPATIENT_CLINIC_OR_DEPARTMENT_OTHER): Payer: Self-pay

## 2024-07-06 ENCOUNTER — Encounter (HOSPITAL_BASED_OUTPATIENT_CLINIC_OR_DEPARTMENT_OTHER): Payer: Self-pay

## 2024-07-06 VITALS — BP 135/81 | HR 81 | Wt 300.4 lb

## 2024-07-06 DIAGNOSIS — R300 Dysuria: Secondary | ICD-10-CM

## 2024-07-06 LAB — POCT URINALYSIS DIP (CLINITEK)
Bilirubin, UA: NEGATIVE
Glucose, UA: NEGATIVE mg/dL
Ketones, POC UA: NEGATIVE mg/dL
Nitrite, UA: NEGATIVE
POC PROTEIN,UA: 30 — AB
Spec Grav, UA: 1.025 (ref 1.010–1.025)
Urobilinogen, UA: 0.2 U/dL
pH, UA: 7 (ref 5.0–8.0)

## 2024-07-06 MED ORDER — PHENAZOPYRIDINE HCL 200 MG PO TABS
200.0000 mg | ORAL_TABLET | Freq: Three times a day (TID) | ORAL | 0 refills | Status: DC | PRN
  Filled 2024-07-06: qty 10, 4d supply, fill #0

## 2024-07-06 MED ORDER — NITROFURANTOIN MONOHYD MACRO 100 MG PO CAPS
100.0000 mg | ORAL_CAPSULE | Freq: Two times a day (BID) | ORAL | 0 refills | Status: DC
  Filled 2024-07-06: qty 10, 5d supply, fill #0

## 2024-07-06 NOTE — Progress Notes (Signed)
 NURSE VISIT- UTI SYMPTOMS   SUBJECTIVE:  Carla Huffman is a 53 y.o. [id number] female here for UTI symptoms. She is a GYN patient. She reports cloudy urine, dysuria, hematuria, urinary frequency, and urinary urgency.  OBJECTIVE:  BP 135/81 (BP Location: Right Arm, Patient Position: Sitting, Cuff Size: Normal)   Pulse 81   Wt (!) 300 lb 6.4 oz (136.3 kg)   SpO2 100%   BMI 41.90 kg/m   Appears well, in no apparent distress  Results for orders placed or performed in visit on 07/06/24 (from the past 24 hours)  POCT URINALYSIS DIP (CLINITEK)   Collection Time: 07/06/24 11:21 AM  Result Value Ref Range   Color, UA yellow yellow   Clarity, UA clear clear   Glucose, UA negative negative mg/dL   Bilirubin, UA negative negative   Ketones, POC UA negative negative mg/dL   Spec Grav, UA 8.974 8.989 - 1.025   Blood, UA large (A) negative   pH, UA 7.0 5.0 - 8.0   POC PROTEIN,UA =30 (A) negative, trace   Urobilinogen, UA 0.2 0.2 or 1.0 E.U./dL   Nitrite, UA Negative Negative   Leukocytes, UA Moderate (2+) (A) Negative    ASSESSMENT: GYN patient with UTI symptoms and negative nitrites.  PLAN: Visit routed to: Dr.Miller Rx sent today: Yes Urine culture: sent Call or return to clinic prn if these symptoms worsen or fail to improve as anticipated. Follow-up: as needed

## 2024-07-10 ENCOUNTER — Telehealth (HOSPITAL_BASED_OUTPATIENT_CLINIC_OR_DEPARTMENT_OTHER): Admitting: Family Medicine

## 2024-07-10 LAB — URINE CULTURE

## 2024-07-11 ENCOUNTER — Ambulatory Visit (HOSPITAL_BASED_OUTPATIENT_CLINIC_OR_DEPARTMENT_OTHER): Payer: Self-pay | Admitting: Obstetrics & Gynecology

## 2024-07-13 MED ORDER — ZEPBOUND 10 MG/0.5ML ~~LOC~~ SOAJ: 10.0000 mg | SUBCUTANEOUS | 1 refills | Status: DC

## 2024-07-13 NOTE — Addendum Note (Signed)
 Addended by: DE PERU, QUINTIN J on: 07/13/2024 08:01 AM   Modules accepted: Orders

## 2024-07-25 ENCOUNTER — Ambulatory Visit
Admission: EM | Admit: 2024-07-25 | Discharge: 2024-07-25 | Disposition: A | Discharge status: Home / Self Care | Attending: Emergency Medicine | Admitting: Emergency Medicine

## 2024-07-25 ENCOUNTER — Ambulatory Visit: Admitting: Radiology

## 2024-07-25 ENCOUNTER — Encounter: Payer: Self-pay | Admitting: Emergency Medicine

## 2024-07-25 DIAGNOSIS — M79672 Pain in left foot: Secondary | ICD-10-CM

## 2024-07-25 DIAGNOSIS — M7732 Calcaneal spur, left foot: Secondary | ICD-10-CM

## 2024-07-25 DIAGNOSIS — M25572 Pain in left ankle and joints of left foot: Secondary | ICD-10-CM | POA: Diagnosis not present

## 2024-07-25 NOTE — ED Provider Notes (Signed)
 [facility]    CSN: [id number] Arrival date & time: 07/25/24  9065      History   Chief Complaint Chief Complaint  Patient presents with   Foot Injury    HPI Carla Huffman is a 53 y.o. female.   53 year old female, Carla Huffman, presents to urgent care for evaluation of left ankle/foot pain after she states she twisted it this morning wearing heels.  Patient states it is painful and burns. Works as a Scientist, physiological.   PMH: HTN,anxiety, hypothyroidism, obesity  The history is provided by the patient. No language interpreter was used.    Past Medical History:  Diagnosis Date   Anxiety    Family history of brain cancer    Family history of breast cancer    Hypertension    Hypothyroidism     Patient Active Problem List   Diagnosis Date Noted   Left foot pain 07/25/2024   Acute left ankle pain 07/25/2024   Heel spur, left 07/25/2024   Periscapular pain of left shoulder 07/03/2024   Poison ivy dermatitis 05/23/2024   Acute cough 03/16/2024   COVID-19 07/13/2023   Pain of left hand 06/16/2023   Gangliocytoma 05/03/2023   Family history of breast cancer    Family history of brain cancer    Low back pain 12/09/2022   Sinusitis 10/02/2022   Severe obesity (BMI >= 40) (HCC) 10/02/2022   Sore throat 07/27/2022   Dizziness 02/25/2022   Nausea and vomiting 02/05/2022   Hypoparathyroidism 12/16/2021   Hypocalcemia 10/20/2021   Hypertension 10/20/2021   Cardiac ischemia 08/28/2021   Supraventricular tachycardia 08/28/2021   Hypothyroidism 06/30/2017   Generalized anxiety disorder 06/30/2017    Past Surgical History:  Procedure Laterality Date   CESAREAN SECTION     SVT ABLATION N/A 12/26/2021   Procedure: SVT ABLATION;  Surgeon: Cindie Ole DASEN, MD;  Location: [facility];  Service: Cardiovascular;  Laterality: N/A;   THYROIDECTOMY      OB History     Gravida  3   Para  2   Term  2   Preterm      AB  1   Living  2       SAB      IAB  1   Ectopic      Multiple      Live Births  2            Home Medications    Prior to Admission medications   Medication Sig Start Date End Date Taking? Authorizing Provider  benzonatate  (TESSALON ) 200 MG capsule Take 1 capsule (200 mg total) by mouth 3 (three) times daily as needed for cough. Patient not taking: Reported on 07/06/2024 03/20/24   de [place], Quintin PARAS, MD  calcium  carbonate (OS-CAL) 600 MG TABS tablet Take 600 mg by mouth daily with breakfast. Patient not taking: Reported on 07/06/2024    [provider]  Flaxseed, Linseed, (FLAXSEED OIL PO) Take 1 capsule by mouth daily at 6 (six) AM. Patient not taking: Reported on 07/06/2024    [provider]  levothyroxine  (SYNTHROID ) 200 MCG tablet TAKE 1 TABLET(200 MCG) BY MOUTH DAILY 01/04/24   de [place], Quintin PARAS, MD  meloxicam  (MOBIC ) 7.5 MG tablet Take 1-2 tablets (7.5-15 mg total) by mouth daily. 07/03/24   de [place], Quintin PARAS, MD  metoprolol  succinate (TOPROL -XL) 50 MG 24 hr tablet Take 1 tablet (50 mg total) by mouth daily. Take with or immediately following  a meal. Patient not taking: Reported on 07/06/2024 08/11/23 07/03/24  Vicci Rollo SAUNDERS, PA-C  Multiple Vitamin (MULTIVITAMIN WITH MINERALS) TABS tablet Take 1 tablet by mouth daily.    [provider]  nitrofurantoin , macrocrystal-monohydrate, (MACROBID ) 100 MG capsule Take 1 capsule (100 mg total) by mouth 2 (two) times daily. 07/06/24   Cleotilde Ronal RAMAN, MD  Nystatin  POWD Apply under breasts twice a day until rash resolves Patient not taking: Reported on 07/06/2024 11/10/23   Lo, Arland POUR, CNM  ondansetron  (ZOFRAN -ODT) 8 MG disintegrating tablet Take 1 tablet (8 mg total) by mouth every 8 (eight) hours as needed for nausea. Patient not taking: Reported on 07/06/2024 02/05/22   de [place], Quintin PARAS, MD  phenazopyridine  (PYRIDIUM ) 100 MG tablet Take 1 tablet (100 mg total) by mouth 3 (three) times daily as needed for pain. Patient not taking:  Reported on 07/06/2024 11/10/23   Lo, Arland POUR, CNM  phenazopyridine  (PYRIDIUM ) 200 MG tablet Take 1 tablet (200 mg total) by mouth 3 (three) times daily as needed for pain. 07/06/24   Cleotilde Ronal RAMAN, MD  tirzepatide  (ZEPBOUND ) 10 MG/0.5ML Pen Inject 10 mg into the skin once a week. 07/13/24   de [place], Quintin PARAS, MD  tiZANidine  (ZANAFLEX ) 4 MG tablet Take 1 tablet (4 mg total) by mouth every 8 (eight) hours as needed for muscle spasms. Patient not taking: Reported on 07/06/2024 12/07/22   Vonna Sharlet POUR, MD  triamcinolone  cream (KENALOG ) 0.1 % Apply 1 Application topically 2 (two) times daily. Patient not taking: Reported on 07/06/2024 05/23/24   de [place], Quintin PARAS, MD  Turmeric 500 MG TABS Take 1 each by mouth daily at 6 (six) AM.    [provider]  vitamin B-12 (CYANOCOBALAMIN) 1000 MCG tablet Take 1,000 mcg by mouth daily.    [provider]    Family History Family History  Problem Relation Age of Onset   Breast cancer Mother 33   Brain cancer Father 33   Breast cancer Paternal Aunt        Father's maternal half sister   Breast cancer Other 12       Half sister--same father   Healthy Half-Sister        Two maternal half sisters    Social History Social History   Tobacco Use   Smoking status: Former    Current packs/day: 0.00    Types: Cigarettes    Quit date: 10/06/1995    Years since quitting: 28.8    Passive exposure: Past   Smokeless tobacco: Never  Vaping Use   Vaping status: Never Used  Substance Use Topics   Alcohol use: Yes    Comment: ocassionaly   Drug use: No     Allergies   Keflex  [cephalexin ], Levofloxacin , and Paxlovid  [nirmatrelvir -ritonavir ]   Review of Systems Review of Systems  Constitutional:  Negative for fever.  Musculoskeletal:  Positive for arthralgias. Negative for joint swelling.  Skin:  Negative for color change.  All other systems reviewed and are negative.    Physical Exam Triage Vital Signs ED Triage Vitals [07/25/24  0949]  Encounter Vitals Group     BP 133/85     Girls Systolic BP Percentile      Girls Diastolic BP Percentile      Boys Systolic BP Percentile      Boys Diastolic BP Percentile      Pulse Rate 85     Resp 17     Temp 98.2 F (36.8 C)  Temp src      SpO2 95 %     Weight      Height      Head Circumference      Peak Flow      Pain Score 6     Pain Loc      Pain Education      Exclude from Growth Chart    No data found.  Updated Vital Signs BP 133/85 (BP Location: Right Arm)   Pulse 85   Temp 98.2 F (36.8 C)   Resp 17   SpO2 95%   Visual Acuity Right Eye Distance:   Left Eye Distance:   Bilateral Distance:    Right Eye Near:   Left Eye Near:    Bilateral Near:     Physical Exam Vitals and nursing note reviewed.  Constitutional:      General: She is not in acute distress.    Appearance: She is well-developed.  HENT:     Head: Normocephalic and atraumatic.  Eyes:     Conjunctiva/sclera: Conjunctivae normal.  Cardiovascular:     Rate and Rhythm: Normal rate and regular rhythm.     Heart sounds: No murmur heard. Pulmonary:     Effort: Pulmonary effort is normal. No respiratory distress.     Breath sounds: Normal breath sounds.  Abdominal:     Palpations: Abdomen is soft.     Tenderness: There is no abdominal tenderness.  Musculoskeletal:        General: No swelling.     Cervical back: Neck supple.     Left ankle: No swelling. Tenderness present over the lateral malleolus. Decreased range of motion. Normal pulse.     Left foot: Bony tenderness present. Normal pulse.  Skin:    General: Skin is warm and dry.     Capillary Refill: Capillary refill takes less than 2 seconds.  Neurological:     General: No focal deficit present.     Mental Status: She is alert and oriented to person, place, and time.     GCS: GCS eye subscore is 4. GCS verbal subscore is 5. GCS motor subscore is 6.  Psychiatric:        Attention and Perception: Attention normal.         Mood and Affect: Mood normal.        Speech: Speech normal.        Behavior: Behavior normal. Behavior is cooperative.      UC Treatments / Results  Labs (all labs ordered are listed, but only abnormal results are displayed) Labs Reviewed - No data to display  EKG   Radiology DG Foot Complete Left Result Date: 07/25/2024 CLINICAL DATA:  Left foot injury with pain. EXAM: LEFT FOOT - COMPLETE 3+ VIEW COMPARISON:  None Available. FINDINGS: Exam demonstrates minimal degenerative changes over the interphalangeal joints. Small inferior calcaneal spur. No acute fracture or dislocation. Soft tissues are unremarkable. IMPRESSION: 1. No acute findings. 2. Minimal degenerative changes. Electronically Signed   By: Toribio Agreste M.D.   On: 07/25/2024 11:03   DG Ankle Complete Left Result Date: 07/25/2024 CLINICAL DATA:  Left ankle pain.  Injury. EXAM: LEFT ANKLE COMPLETE - 3+ VIEW COMPARISON:  None Available. FINDINGS: Ankle mortise is normal. No acute fracture or dislocation. Small inferior calcaneal spur. Mild degenerate change over the midfoot. Soft tissues are unremarkable. IMPRESSION: 1. No acute findings. 2. Mild degenerative changes. Electronically Signed   By: Toribio Agreste M.D.   On: 07/25/2024  10:41    Procedures Procedures (including critical care time)  Medications Ordered in UC Medications - No data to display  Initial Impression / Assessment and Plan / UC Course  I have reviewed the triage vital signs and the nursing notes.  Pertinent labs & imaging results that were available during my care of the patient were reviewed by me and considered in my medical decision making (see chart for details).  Clinical Course as of 07/25/24 1325  Tue Jul 25, 2024  9040 Pt took tylenol  PTA, will xray left foot and ankle [JD]    Clinical Course User Index [JD] Desean Heemstra, Rilla, NP   Discussed exam findings and plan of care with patient, x-rays are negative, Ace wrap and crutches  provided, strict go to ER precautions given, RICE, follow-up with your orthopedist.  Patient verbalized understanding to this provider  Ddx: Left foot/ankle sprain, pain, left heel spur Final Clinical Impressions(s) / UC Diagnoses   Final diagnoses:  Left foot pain  Acute left ankle pain  Heel spur, left     Discharge Instructions      Your foot/ankle xray are negative for acute findings. Small heel spur noted, degenerative changes(mild). Most likely you have sprained your foot and ankle, rest, ice, elevate, wear Ace wrap for comfort, use crutches. Please contact your orthopedics at [facility] for follow-up within a week, sooner if worse May take Tylenol  as label directed for pain, may use over-the-counter Voltaren  gel as label directed as well for pain.     ED Prescriptions   None    PDMP not reviewed this encounter.   Aminta Rilla, NP 07/25/24 1325

## 2024-07-25 NOTE — Discharge Instructions (Signed)
 Your foot/ankle xray are negative for acute findings. Small heel spur noted, degenerative changes(mild). Most likely you have sprained your foot and ankle, rest, ice, elevate, wear Ace wrap for comfort, use crutches. Please contact your orthopedics at [facility] for follow-up within a week, sooner if worse May take Tylenol  as label directed for pain, may use over-the-counter Voltaren  gel as label directed as well for pain.

## 2024-07-25 NOTE — ED Triage Notes (Signed)
 Pt c/o left ankle pain after she twisted ankle this morning while wearing heels. States it is painful and burns.

## 2024-08-05 ENCOUNTER — Other Ambulatory Visit (HOSPITAL_BASED_OUTPATIENT_CLINIC_OR_DEPARTMENT_OTHER): Payer: Self-pay | Admitting: Family Medicine

## 2024-08-07 ENCOUNTER — Encounter: Payer: Self-pay | Admitting: Radiology

## 2024-08-15 ENCOUNTER — Ambulatory Visit (INDEPENDENT_AMBULATORY_CARE_PROVIDER_SITE_OTHER): Admitting: Family Medicine

## 2024-08-15 ENCOUNTER — Encounter (HOSPITAL_BASED_OUTPATIENT_CLINIC_OR_DEPARTMENT_OTHER): Payer: Self-pay | Admitting: Family Medicine

## 2024-08-15 DIAGNOSIS — R112 Nausea with vomiting, unspecified: Secondary | ICD-10-CM

## 2024-08-15 DIAGNOSIS — Z0000 Encounter for general adult medical examination without abnormal findings: Secondary | ICD-10-CM

## 2024-08-15 MED ORDER — ONDANSETRON 8 MG PO TBDP: 8.0000 mg | ORAL_TABLET | Freq: Three times a day (TID) | ORAL | 3 refills | Status: AC | PRN

## 2024-08-15 NOTE — Progress Notes (Signed)
    Procedures performed today:    None.  Independent interpretation of notes and tests performed by another provider:   None.  Brief History, Exam, Impression, and Recommendations:    BP 104/72 (BP Location: Left Arm, Patient Position: Sitting, Cuff Size: Large)   Pulse 76   Ht 5' 11 (1.803 m)   Wt 290 lb (131.5 kg)   SpO2 96%   BMI 40.45 kg/m   Severe obesity (BMI >= 40) (HCC) Assessment & Plan: Patient reports that she has been doing well with medication.  She is currently administering 10 mg dose, has administered 4 doses thus far.  She notes some mild nausea for the first few days after administering the injection, however denies any other side effects or symptoms.  She has noted more than 30 pound weight loss since starting medication. Starting weight: 329 pounds Current weight: 290 pounds  - Continue Zepbound  10 mg weekly. - Consider increasing to 12.5 mg after completion of current month supply; she will message if ready. - Plan follow-up in about 2 months   Nausea and vomiting, unspecified vomiting type -     Ondansetron ; Take 1 tablet (8 mg total) by mouth every 8 (eight) hours as needed for nausea.  Dispense: 20 tablet; Refill: 3  Wellness examination -     CBC with Differential/Platelet; Future -     Comprehensive metabolic panel with GFR; Future -     Hemoglobin A1c; Future -     Lipid panel; Future -     TSH; Future  Return in about 2 months (around 10/15/2024) for CPE with fasting labs 1 week prior.   ___________________________________________ Amonte Brookover de [place], MD, ABFM, CAQSM Primary Care and Sports Medicine [facility]

## 2024-08-15 NOTE — Assessment & Plan Note (Addendum)
 Patient reports that she has been doing well with medication.  She is currently administering 10 mg dose, has administered 4 doses thus far.  She notes some mild nausea for the first few days after administering the injection, however denies any other side effects or symptoms.  She has noted more than 30 pound weight loss since starting medication. Starting weight: 329 pounds Current weight: 290 pounds  - Continue Zepbound  10 mg weekly. - Consider increasing to 12.5 mg after completion of current month supply; she will message if ready. - Plan follow-up in about 2 months

## 2024-08-22 ENCOUNTER — Other Ambulatory Visit (HOSPITAL_COMMUNITY): Payer: Self-pay

## 2024-08-22 ENCOUNTER — Telehealth (HOSPITAL_BASED_OUTPATIENT_CLINIC_OR_DEPARTMENT_OTHER): Payer: Self-pay | Admitting: Pharmacy Technician

## 2024-08-22 NOTE — Telephone Encounter (Signed)
 Pharmacy Patient Advocate Encounter   Received notification from Onbase that prior authorization for Zepbound  is due for renewal.   Insurance verification completed.   The patient is insured through [facility].  Action: PA required; PA submitted to above mentioned insurance via Latent Key/confirmation #/[id number] Status is pending

## 2024-08-22 NOTE — Telephone Encounter (Signed)
 Pharmacy Patient Advocate Encounter  Received notification from [employer] that Prior Authorization for Zepbound  has been APPROVED from 08/22/2024 to 08/22/2025.   PA #/Case ID/Reference #: [id number]

## 2024-09-05 ENCOUNTER — Encounter (HOSPITAL_BASED_OUTPATIENT_CLINIC_OR_DEPARTMENT_OTHER): Payer: Self-pay | Admitting: Family Medicine

## 2024-09-05 MED ORDER — ZEPBOUND 12.5 MG/0.5ML ~~LOC~~ SOAJ: 12.5000 mg | SUBCUTANEOUS | 2 refills | Status: DC

## 2024-10-14 LAB — VITAMIN D, 25-HYDROXY, TOTAL: Vitamin D, 25-Hydroxy, Serum: 38 ng/mL

## 2024-10-14 LAB — VITAMIN B12: Vitamin B-12: 631 pg/mL (ref 232–1245)

## 2024-10-16 ENCOUNTER — Ambulatory Visit (HOSPITAL_BASED_OUTPATIENT_CLINIC_OR_DEPARTMENT_OTHER): Admitting: Family Medicine

## 2024-10-16 ENCOUNTER — Encounter (HOSPITAL_BASED_OUTPATIENT_CLINIC_OR_DEPARTMENT_OTHER): Payer: Self-pay | Admitting: Family Medicine

## 2024-10-16 VITALS — BP 141/82 | HR 84 | Temp 97.8°F | Resp 18 | Ht 71.0 in | Wt 282.0 lb

## 2024-10-16 DIAGNOSIS — Z1211 Encounter for screening for malignant neoplasm of colon: Secondary | ICD-10-CM | POA: Diagnosis not present

## 2024-10-16 DIAGNOSIS — Z0000 Encounter for general adult medical examination without abnormal findings: Secondary | ICD-10-CM | POA: Diagnosis not present

## 2024-10-16 MED ORDER — ZEPBOUND 12.5 MG/0.5ML ~~LOC~~ SOAJ: 12.5000 mg | SUBCUTANEOUS | 2 refills | Status: AC

## 2024-10-16 MED ORDER — LEVOTHYROXINE SODIUM 200 MCG PO TABS: 200.0000 ug | ORAL_TABLET | Freq: Every day | ORAL | 1 refills | Status: AC

## 2024-10-16 NOTE — Assessment & Plan Note (Signed)
 Routine HCM labs ordered. HCM reviewed/discussed. Anticipatory guidance regarding healthy weight, lifestyle and choices given. Recommend healthy diet.  Recommend approximately 150 minutes/week of moderate intensity exercise Recommend regular dental and vision exams Always use seatbelt/lap and shoulder restraints Recommend using smoke alarms and checking batteries at least twice a year Recommend using sunscreen when outside Discussed colon cancer screening recommendations, options.  Patient prefers Cologuard Discussed immunization recommendations

## 2024-10-16 NOTE — Progress Notes (Signed)
 " Subjective:    CC: Annual Physical Exam  HPI:  Carla Huffman is a 54 y.o. presenting for annual physical  I reviewed the past medical history, family history, social history, surgical history, and allergies today and no changes were needed.  Please see the problem list section below in epic for further details.  Past Medical History: Past Medical History:  Diagnosis Date   Anxiety    Family history of brain cancer    Family history of breast cancer    Hypertension    Hypothyroidism    Past Surgical History: Past Surgical History:  Procedure Laterality Date   CESAREAN SECTION     SVT ABLATION N/A 12/26/2021   Procedure: SVT ABLATION;  Surgeon: Cindie Ole DASEN, MD;  Location: [facility];  Service: Cardiovascular;  Laterality: N/A;   THYROIDECTOMY     Social History: Social History   Socioeconomic History   Marital status: Married    Spouse name: Not on file   Number of children: Not on file   Years of education: Not on file   Highest education level: Not on file  Occupational History   Not on file  Tobacco Use   Smoking status: Former    Current packs/day: 0.00    Types: Cigarettes    Quit date: 10/06/1995    Years since quitting: 29.1    Passive exposure: Past   Smokeless tobacco: Never  Vaping Use   Vaping status: Never Used  Substance and Sexual Activity   Alcohol use: Yes    Comment: ocassionaly   Drug use: No   Sexual activity: Yes    Birth control/protection: None  Other Topics Concern   Not on file  Social History Narrative   Not on file   Social Drivers of Health   Tobacco Use: Medium Risk (10/16/2024)   Patient History    Smoking Tobacco Use: Former    Smokeless Tobacco Use: Never    Passive Exposure: Past  Physicist, Medical Strain: Low Risk (06/16/2023)   Overall Financial Resource Strain (CARDIA)    Difficulty of Paying Living Expenses: Not hard at all  Food Insecurity: No Food Insecurity (06/16/2023)   Hunger Vital Sign     Worried About Running Out of Food in the Last Year: Never true    Ran Out of Food in the Last Year: Never true  Transportation Needs: No Transportation Needs (06/16/2023)   PRAPARE - Administrator, Civil Service (Medical): No    Lack of Transportation (Non-Medical): No  Physical Activity: Insufficiently Active (06/16/2023)   Exercise Vital Sign    Days of Exercise per Week: 3 days    Minutes of Exercise per Session: 30 min  Stress: No Stress Concern Present (06/16/2023)   [employer] of Occupational Health - Occupational Stress Questionnaire    Feeling of Stress : Not at all  Social Connections: Moderately Integrated (06/16/2023)   Social Connection and Isolation Panel    Frequency of Communication with Friends and Family: More than three times a week    Frequency of Social Gatherings with Friends and Family: Once a week    Attends Religious Services: More than 4 times per year    Active Member of Clubs or Organizations: No    Attends Banker Meetings: Never    Marital Status: Married  Depression (PHQ2-9): Low Risk (07/03/2024)   Depression (PHQ2-9)    PHQ-2 Score: 0  Alcohol Screen: Low Risk (06/16/2023)   Alcohol Screen  Last Alcohol Screening Score (AUDIT): 0  Housing: Low Risk (06/16/2023)   Housing    Last Housing Risk Score: 0  Utilities: At Risk (06/16/2023)   AHC Utilities    Threatened with loss of utilities: Yes  Health Literacy: Adequate Health Literacy (06/16/2023)   B1300 Health Literacy    Frequency of need for help with medical instructions: Never   Family History: Family History  Problem Relation Age of Onset   Breast cancer Mother 66   Brain cancer Father 48   Breast cancer Paternal Aunt        Father's maternal half sister   Breast cancer Other 61       Half sister--same father   Healthy Half-Sister        Two maternal half sisters   Allergies: Allergies[1] Medications: See med rec.  Review of Systems: No headache, visual  changes, nausea, vomiting, diarrhea, constipation, dizziness, abdominal pain, skin rash, fevers, chills, night sweats, swollen lymph nodes, weight loss, chest pain, body aches, joint swelling, muscle aches, shortness of breath, mood changes, visual or auditory hallucinations.  Objective:    BP (!) 141/82 (BP Location: Left Arm, Patient Position: Sitting, Cuff Size: Large)   Pulse 84   Temp 97.8 F (36.6 C) (Oral)   Resp 18   Ht 5' 11 (1.803 m)   Wt 282 lb (127.9 kg)   SpO2 100%   BMI 39.33 kg/m   General: Well Developed, well nourished, and in no acute distress.  Neuro: Alert and oriented x3, extra-ocular muscles intact, sensation grossly intact. Cranial nerves II through XII are intact, motor, sensory, and coordinative functions are all intact. HEENT: Normocephalic, atraumatic, pupils equal round reactive to light, neck supple, no masses, no lymphadenopathy, thyroid  nonpalpable. Oropharynx, nasopharynx, external ear canals are unremarkable. Skin: Warm and dry, no rashes noted.  Cardiac: Regular rate and rhythm, no murmurs rubs or gallops.  Respiratory: Clear to auscultation bilaterally. Not using accessory muscles, speaking in full sentences.  Abdominal: Soft, nontender, nondistended, positive bowel sounds, no masses, no organomegaly.  Musculoskeletal: Shoulder, elbow, wrist, hip, knee, ankle stable, and with full range of motion.  Impression and Recommendations:    Wellness examination Assessment & Plan: Routine HCM labs ordered. HCM reviewed/discussed. Anticipatory guidance regarding healthy weight, lifestyle and choices given. Recommend healthy diet.  Recommend approximately 150 minutes/week of moderate intensity exercise Recommend regular dental and vision exams Always use seatbelt/lap and shoulder restraints Recommend using smoke alarms and checking batteries at least twice a year Recommend using sunscreen when outside Discussed colon cancer screening recommendations,  options.  Patient prefers Cologuard Discussed immunization recommendations   Colon cancer screening -     Cologuard  Severe obesity (BMI >= 40) (HCC) Assessment & Plan: Patient reports that she has been doing well with medication.  She is currently administering 12.5 mg dose, has administered more than 1 month so far.  She notes some mild constipation for the first few days after administering the injection, however denies any other side effects or symptoms. She also continues with lifestyle modifications. Starting weight: 329 pounds Current weight: 282 pounds  - Continue Zepbound  12.5 mg weekly - Plan follow-up in about 3 months  Orders: -     Zepbound ; Inject 12.5 mg into the skin once a week.  Dispense: 2 mL; Refill: 2  Other orders -     Levothyroxine  Sodium; Take 1 tablet (200 mcg total) by mouth daily before breakfast.  Dispense: 90 tablet; Refill: 1  Return in about  3 months (around 01/14/2025) for med check.   ___________________________________________ Alexx Mcburney de [place], MD, ABFM, CAQSM Primary Care and Sports Medicine [facility]     [1]  Allergies Allergen Reactions   Keflex  [Cephalexin ] Itching   Levofloxacin      nausea   Paxlovid  [Nirmatrelvir -Ritonavir ]    "

## 2024-10-16 NOTE — Assessment & Plan Note (Signed)
 Patient reports that she has been doing well with medication.  She is currently administering 12.5 mg dose, has administered more than 1 month so far.  She notes some mild constipation for the first few days after administering the injection, however denies any other side effects or symptoms. She also continues with lifestyle modifications. Starting weight: 329 pounds Current weight: 282 pounds  - Continue Zepbound  12.5 mg weekly - Plan follow-up in about 3 months

## 2024-10-20 ENCOUNTER — Other Ambulatory Visit (HOSPITAL_COMMUNITY): Payer: Self-pay

## 2024-10-20 ENCOUNTER — Telehealth (HOSPITAL_BASED_OUTPATIENT_CLINIC_OR_DEPARTMENT_OTHER): Payer: Self-pay | Admitting: Pharmacy Technician

## 2024-10-20 NOTE — Telephone Encounter (Signed)
 Pharmacy Patient Advocate Encounter   Received notification from [facility] that prior authorization for Zepbound  12.5mg /0.86ml auto-injectors is required/requested.   Insurance verification completed.   The patient is insured through [facility].   Per test claim: Refill too soon. PA is not needed at this time. Medication was filled 10/03/2024. Next eligible fill date is 10/24/2024.

## 2024-10-29 ENCOUNTER — Ambulatory Visit (HOSPITAL_BASED_OUTPATIENT_CLINIC_OR_DEPARTMENT_OTHER): Payer: Self-pay | Admitting: Obstetrics & Gynecology

## 2024-11-05 ENCOUNTER — Encounter (HOSPITAL_BASED_OUTPATIENT_CLINIC_OR_DEPARTMENT_OTHER): Payer: Self-pay | Admitting: Family Medicine

## 2024-11-09 ENCOUNTER — Telehealth (HOSPITAL_BASED_OUTPATIENT_CLINIC_OR_DEPARTMENT_OTHER): Admitting: Family Medicine

## 2024-11-09 ENCOUNTER — Encounter (HOSPITAL_BASED_OUTPATIENT_CLINIC_OR_DEPARTMENT_OTHER): Payer: Self-pay | Admitting: Family Medicine

## 2024-11-09 DIAGNOSIS — F40232 Fear of other medical care: Secondary | ICD-10-CM | POA: Insufficient documentation

## 2024-11-09 MED ORDER — DIAZEPAM 5 MG PO TABS: 5.0000 mg | ORAL_TABLET | Freq: Once | ORAL | 0 refills | Status: AC

## 2024-11-09 NOTE — Progress Notes (Signed)
" ° °  Virtual Visit   I connected with  Robbyn Gaede  on 11/09/24 by telehealth and verified that I am speaking with the correct person using two identifiers. Visit completed via video.   I discussed the limitations, risks, security and privacy concerns of performing an evaluation and management service by telephone, including the higher likelihood of inaccurate diagnosis and treatment, and the availability of in person appointments.  We also discussed the likely need of an additional face to face encounter for complete and high quality delivery of care.  I also discussed with the patient that there may be a patient responsible charge related to this service. The patient expressed understanding and wishes to proceed.  Provider location is in medical facility. Patient location is at their home, different from provider location. People involved in care of the patient during this telehealth encounter were myself, my nurse/medical assistant, and my front office/scheduling team member.  Review of Systems: No fevers, chills, night sweats, weight loss, chest pain, or shortness of breath.   Objective Findings:    General: Speaking full sentences, no audible heavy breathing.  Sounds alert and appropriately interactive.    Independent interpretation of tests performed by another provider:   None.  Brief History, Exam, Impression, and Recommendations:    Phobia of dental procedure Patient with concerns today related to upcoming dental procedure.  She indicates that plan is for extraction of several teeth.  Notes that procedure may take up to a few hours.  She did talk with her dentist about possibly having medication available to help with anxiety related to procedure, but they informed patient that she would need to talk with her PCP regarding this. She has utilized Valium  in the past when she had to have advanced imaging completed.  Reports tolerating medication and does not recall any adverse  effects from medication.  In regards to treatment, she did feel that Valium  helped control symptoms. We discussed considerations today.  Given planned procedure and duration of procedure, we discussed possible dosing of Valium .  Could either consider taking 5 mg initially with a second 5 mg dose available as needed.  This could be difficult based on the procedure she will be having done.  She will discuss with her dentist further if this would be possible.  If not, could consider taking 10 mg dose at once.  Did caution on potential side effects with taking higher dose initially including risk of adverse effects from medication.  These adverse effects were reviewed today. PDMP reviewed, no red flags.  Prescription sent to pharmacy on file.  Cautioned on sedative side effects and to avoid operating heavy machinery.  She plans to have a ride home from the dental office due to the procedure that she will be having done.  I discussed the above assessment and treatment plan with the patient. The patient was provided an opportunity to ask questions and all were answered. The patient agreed with the plan and demonstrated an understanding of the instructions.   The patient was advised to call back or seek an in-person evaluation if the symptoms worsen or if the condition fails to improve as anticipated.   I provided 12 minutes of face to face and non-face-to-face time during this encounter date, time was needed to gather information, review chart, records, communicate/coordinate with staff remotely, as well as complete documentation.   ___________________________________________ Zykeria Laguardia de [place], MD, ABFM, CAQSM Primary Care and Sports Medicine [facility] "

## 2024-11-09 NOTE — Assessment & Plan Note (Signed)
 Patient with concerns today related to upcoming dental procedure.  She indicates that plan is for extraction of several teeth.  Notes that procedure may take up to a few hours.  She did talk with her dentist about possibly having medication available to help with anxiety related to procedure, but they informed patient that she would need to talk with her PCP regarding this. She has utilized Valium  in the past when she had to have advanced imaging completed.  Reports tolerating medication and does not recall any adverse effects from medication.  In regards to treatment, she did feel that Valium  helped control symptoms. We discussed considerations today.  Given planned procedure and duration of procedure, we discussed possible dosing of Valium .  Could either consider taking 5 mg initially with a second 5 mg dose available as needed.  This could be difficult based on the procedure she will be having done.  She will discuss with her dentist further if this would be possible.  If not, could consider taking 10 mg dose at once.  Did caution on potential side effects with taking higher dose initially including risk of adverse effects from medication.  These adverse effects were reviewed today. PDMP reviewed, no red flags.  Prescription sent to pharmacy on file.  Cautioned on sedative side effects and to avoid operating heavy machinery.  She plans to have a ride home from the dental office due to the procedure that she will be having done.

## 2025-01-15 ENCOUNTER — Ambulatory Visit (HOSPITAL_BASED_OUTPATIENT_CLINIC_OR_DEPARTMENT_OTHER): Admitting: Family Medicine
